# Patient Record
Sex: Male | Born: 1965 | Hispanic: No | Marital: Single | Smoking: Never smoker
Health system: Southern US, Community
[De-identification: ages and names within clinical notes are randomized; demographics above are authoritative.]

## PROBLEM LIST (undated history)

## (undated) DIAGNOSIS — E785 Hyperlipidemia, unspecified: Secondary | ICD-10-CM

## (undated) DIAGNOSIS — B1910 Unspecified viral hepatitis B without hepatic coma: Secondary | ICD-10-CM

## (undated) DIAGNOSIS — K219 Gastro-esophageal reflux disease without esophagitis: Secondary | ICD-10-CM

## (undated) DIAGNOSIS — R7303 Prediabetes: Secondary | ICD-10-CM

## (undated) DIAGNOSIS — T7840XA Allergy, unspecified, initial encounter: Secondary | ICD-10-CM

## (undated) HISTORY — DX: Allergy, unspecified, initial encounter: T78.40XA

## (undated) HISTORY — DX: Prediabetes: R73.03

## (undated) HISTORY — PX: NO PAST SURGERIES: SHX2092

## (undated) HISTORY — DX: Unspecified viral hepatitis B without hepatic coma: B19.10

## (undated) HISTORY — DX: Gastro-esophageal reflux disease without esophagitis: K21.9

## (undated) HISTORY — DX: Hyperlipidemia, unspecified: E78.5

---

## 2001-03-08 ENCOUNTER — Encounter: Payer: Self-pay | Admitting: Gastroenterology

## 2001-03-08 ENCOUNTER — Encounter: Admission: RE | Admit: 2001-03-08 | Discharge: 2001-03-08 | Payer: Self-pay | Admitting: Gastroenterology

## 2002-04-11 ENCOUNTER — Encounter: Admission: RE | Admit: 2002-04-11 | Discharge: 2002-04-11 | Payer: Self-pay | Admitting: Gastroenterology

## 2002-04-11 ENCOUNTER — Encounter: Payer: Self-pay | Admitting: Gastroenterology

## 2005-04-25 ENCOUNTER — Encounter: Admission: RE | Admit: 2005-04-25 | Discharge: 2005-04-25 | Payer: Self-pay | Admitting: Gastroenterology

## 2006-04-24 ENCOUNTER — Encounter: Admission: RE | Admit: 2006-04-24 | Discharge: 2006-04-24 | Payer: Self-pay | Admitting: Gastroenterology

## 2007-09-29 ENCOUNTER — Encounter: Admission: RE | Admit: 2007-09-29 | Discharge: 2007-09-29 | Payer: Self-pay | Admitting: Gastroenterology

## 2007-10-08 ENCOUNTER — Encounter: Admission: RE | Admit: 2007-10-08 | Discharge: 2007-10-08 | Payer: Self-pay | Admitting: Gastroenterology

## 2008-04-07 ENCOUNTER — Encounter: Admission: RE | Admit: 2008-04-07 | Discharge: 2008-04-07 | Payer: Self-pay | Admitting: Gastroenterology

## 2009-11-23 ENCOUNTER — Encounter: Admission: RE | Admit: 2009-11-23 | Discharge: 2009-11-23 | Payer: Self-pay | Admitting: Gastroenterology

## 2010-12-09 ENCOUNTER — Other Ambulatory Visit: Payer: Self-pay | Admitting: Gastroenterology

## 2010-12-09 DIAGNOSIS — C220 Liver cell carcinoma: Secondary | ICD-10-CM

## 2010-12-13 ENCOUNTER — Ambulatory Visit
Admission: RE | Admit: 2010-12-13 | Discharge: 2010-12-13 | Disposition: A | Discharge status: Home / Self Care | Payer: BC Managed Care – PPO | Source: Ambulatory Visit | Attending: Gastroenterology | Admitting: Gastroenterology

## 2010-12-13 DIAGNOSIS — C220 Liver cell carcinoma: Secondary | ICD-10-CM

## 2010-12-19 ENCOUNTER — Other Ambulatory Visit: Payer: Self-pay

## 2011-09-10 ENCOUNTER — Ambulatory Visit (INDEPENDENT_AMBULATORY_CARE_PROVIDER_SITE_OTHER): Payer: BC Managed Care – PPO

## 2011-09-10 DIAGNOSIS — B181 Chronic viral hepatitis B without delta-agent: Secondary | ICD-10-CM

## 2011-09-10 DIAGNOSIS — R42 Dizziness and giddiness: Secondary | ICD-10-CM

## 2011-09-10 DIAGNOSIS — H6120 Impacted cerumen, unspecified ear: Secondary | ICD-10-CM

## 2011-09-10 DIAGNOSIS — H9209 Otalgia, unspecified ear: Secondary | ICD-10-CM

## 2011-09-22 ENCOUNTER — Ambulatory Visit (INDEPENDENT_AMBULATORY_CARE_PROVIDER_SITE_OTHER): Payer: BC Managed Care – PPO | Admitting: Family Medicine

## 2011-09-22 VITALS — BP 122/74 | HR 62 | Temp 98.3°F | Resp 16 | Ht 67.0 in | Wt 161.4 lb

## 2011-09-22 DIAGNOSIS — B1910 Unspecified viral hepatitis B without hepatic coma: Secondary | ICD-10-CM

## 2011-09-22 DIAGNOSIS — M5430 Sciatica, unspecified side: Secondary | ICD-10-CM

## 2011-09-22 DIAGNOSIS — M545 Low back pain, unspecified: Secondary | ICD-10-CM

## 2011-09-22 DIAGNOSIS — B181 Chronic viral hepatitis B without delta-agent: Secondary | ICD-10-CM | POA: Insufficient documentation

## 2011-09-22 MED ORDER — CYCLOBENZAPRINE HCL 10 MG PO TABS: 10.0000 mg | ORAL_TABLET | Freq: Two times a day (BID) | ORAL | Status: AC | PRN

## 2011-09-22 MED ORDER — PREDNISONE 20 MG PO TABS
ORAL_TABLET | ORAL | Status: DC
Start: 2011-09-22 — End: 2012-03-08

## 2011-09-22 NOTE — Progress Notes (Signed)
Grant Wilson is a 46 year old gentleman who works at [employer]. Saturday he was reaching to his right and felt a sharp pain in his back. He's had this problem before in the remote past. This time the pain shot down his right leg since that time he said stiffness in his back and pain when he gets out of a chair. The sharp pain radiates down his right thigh from time to time when he is walking. He denies any trouble with urination, fever, bowel problems  Objective: This is an adult male in no acute distress he is moving very slowly.  Reflexes are normal straight leg raising is negative to and he is not tender in his low back. Abdomen is soft nontender without masses. Extremities unremarkable except for dry skin  Assessment: Acute low back syndrome with no reflex.  Plan: Keep out of work for 2 days, prednisone, Flexeril

## 2011-09-22 NOTE — Patient Instructions (Signed)
Back Pain, Adult Low back pain is very common. About 1 in 5 people have back pain.The cause of low back pain is rarely dangerous. The pain often gets better over time.About half of people with a sudden onset of back pain feel better in just 2 weeks. About 8 in 10 people feel better by 6 weeks.  CAUSES Some common causes of back pain include:  Strain of the muscles or ligaments supporting the spine.   Wear and tear (degeneration) of the spinal discs.   Arthritis.   Direct injury to the back.  DIAGNOSIS Most of the time, the direct cause of low back pain is not known.However, back pain can be treated effectively even when the exact cause of the pain is unknown.Answering your caregiver's questions about your overall health and symptoms is one of the most accurate ways to make sure the cause of your pain is not dangerous. If your caregiver needs more information, he or she may order lab work or imaging tests (X-rays or MRIs).However, even if imaging tests show changes in your back, this usually does not require surgery. HOME CARE INSTRUCTIONS For many people, back pain returns.Since low back pain is rarely dangerous, it is often a condition that people can learn to manageon their own.   Remain active. It is stressful on the back to sit or stand in one place. Do not sit, drive, or stand in one place for more than 30 minutes at a time. Take short walks on level surfaces as soon as pain allows.Try to increase the length of time you walk each day.   Do not stay in bed.Resting more than 1 or 2 days can delay your recovery.   Do not avoid exercise or work.Your body is made to move.It is not dangerous to be active, even though your back may hurt.Your back will likely heal faster if you return to being active before your pain is gone.   Pay attention to your body when you bend and lift. Many people have less discomfortwhen lifting if they bend their knees, keep the load close to their  bodies,and avoid twisting. Often, the most comfortable positions are those that put less stress on your recovering back.   Find a comfortable position to sleep. Use a firm mattress and lie on your side with your knees slightly bent. If you lie on your back, put a pillow under your knees.   Only take over-the-counter or prescription medicines as directed by your caregiver. Over-the-counter medicines to reduce pain and inflammation are often the most helpful.Your caregiver may prescribe muscle relaxant drugs.These medicines help dull your pain so you can more quickly return to your normal activities and healthy exercise.   Put ice on the injured area.   Put ice in a plastic bag.   Place a towel between your skin and the bag.   Leave the ice on for 15 to 20 minutes, 3 to 4 times a day for the first 2 to 3 days. After that, ice and heat may be alternated to reduce pain and spasms.   Ask your caregiver about trying back exercises and gentle massage. This may be of some benefit.   Avoid feeling anxious or stressed.Stress increases muscle tension and can worsen back pain.It is important to recognize when you are anxious or stressed and learn ways to manage it.Exercise is a great option.  SEEK MEDICAL CARE IF:  You have pain that is not relieved with rest or medicine.   You have   pain that does not improve in 1 week.   You have new symptoms.   You are generally not feeling well.  SEEK IMMEDIATE MEDICAL CARE IF:   You have pain that radiates from your back into your legs.   You develop new bowel or bladder control problems.   You have unusual weakness or numbness in your arms or legs.   You develop nausea or vomiting.   You develop abdominal pain.   You feel faint.  Document Released: 08/04/2005 Document Revised: 04/16/2011 Document Reviewed: 12/23/2010 [employer] Patient Information 2012 [employer]. 

## 2011-11-20 ENCOUNTER — Ambulatory Visit: Payer: BC Managed Care – PPO

## 2011-11-20 ENCOUNTER — Ambulatory Visit (INDEPENDENT_AMBULATORY_CARE_PROVIDER_SITE_OTHER): Payer: BC Managed Care – PPO | Admitting: Emergency Medicine

## 2011-11-20 VITALS — BP 124/73 | HR 69 | Temp 98.1°F | Resp 16 | Ht 67.0 in | Wt 166.0 lb

## 2011-11-20 DIAGNOSIS — M549 Dorsalgia, unspecified: Secondary | ICD-10-CM

## 2011-11-20 DIAGNOSIS — R1084 Generalized abdominal pain: Secondary | ICD-10-CM

## 2011-11-20 LAB — POCT URINALYSIS DIPSTICK
Bilirubin, UA: NEGATIVE
Leukocytes, UA: NEGATIVE
Nitrite, UA: NEGATIVE
pH, UA: 6.5

## 2011-11-20 LAB — POCT UA - MICROSCOPIC ONLY
WBC, Ur, HPF, POC: NEGATIVE
Yeast, UA: NEGATIVE

## 2011-11-20 MED ORDER — CYCLOBENZAPRINE HCL 10 MG PO TABS: ORAL_TABLET | ORAL | Status: DC

## 2011-11-20 MED ORDER — MELOXICAM 7.5 MG PO TABS: ORAL_TABLET | ORAL | Status: DC

## 2011-11-20 NOTE — Patient Instructions (Signed)
Advised to get MiraLax and take one dose a day for constipation. Next 1 cap of powder with 8 ounces of water.

## 2011-11-20 NOTE — Progress Notes (Signed)
  Subjective:    Patient ID: Grant Wilson, male    DOB: Jul 20, 1966, 46 y.o.   MRN: [id number]  HPI patient enters with pain in his low back which extends around his abdomen. He has had some pain into his legs. He does not do heavy lifting at work and does not remember a specific injury. He denies urinary symptoms of burning.    Review of Systems his basically been in good health. He felt tightness back today     Objective:   Physical Exam  Abdominal:       The abdomen is not distended there are no areas of tenderness. There no masses felt.  Musculoskeletal:       Examination the back reveals tenderness in the lower lumbar area on both sides. Deep tendon reflexes are 2+ and symmetrical. Straight leg raising is negative to 90. There is 5 out of 5 strength in both legs.    [facility] reading (PRIMARY) by  Dr. Cleta Alberts two-view abdomen reveals large amount of stool present. There are no bony abnormalities seen.        Assessment & Plan:   Will proceed with UA and go and get a view his LS-spine. Advise patient to use MiraLax for his constipation. Have started him on a muscle relaxant and anti-inflammatories for his back.

## 2011-11-28 ENCOUNTER — Ambulatory Visit (INDEPENDENT_AMBULATORY_CARE_PROVIDER_SITE_OTHER): Payer: BC Managed Care – PPO | Admitting: Family Medicine

## 2011-11-28 ENCOUNTER — Ambulatory Visit: Payer: BC Managed Care – PPO

## 2011-11-28 VITALS — BP 142/79 | HR 65 | Temp 98.2°F | Resp 16 | Ht 66.0 in | Wt 168.6 lb

## 2011-11-28 DIAGNOSIS — IMO0002 Reserved for concepts with insufficient information to code with codable children: Secondary | ICD-10-CM

## 2011-11-28 DIAGNOSIS — S39012A Strain of muscle, fascia and tendon of lower back, initial encounter: Secondary | ICD-10-CM

## 2011-11-28 MED ORDER — MELOXICAM 7.5 MG PO TABS: ORAL_TABLET | ORAL | Status: DC

## 2011-11-28 NOTE — Progress Notes (Signed)
Subjective: Patient is here for a recheck with regard to his back. He continues to have pain in the right side of the back, just above the pelvic brim, radiating around to the right carina area. He has some pain into his thigh. The pain is especially bad if he stands for a period of time. He does work at [employer], where he does have to do some pulling. He is on his feet for long stretches of time. He worked through Wednesday, but took yesterday and today off to rest his back. He  is not scheduled to go back to work until Monday. There was no specific injury. When he was in here last week he had x-rays of the lumbar spine which were normal, and a urinalysis which was normal. He has not had any urinary symptoms. He was treated with NSAID S., steroids, and muscle accident.  Objective: Mild tenderness of the right lumbar spine. Good flexion extension and lateral flexion. However there is pain on the right side when he flexes forward or tilts to the left pulling on the right. No tenderness of the abdomen or groin. Deep tendon reflexes are brisk. A. Leg raising test is negative.  Assessment: Lumbar strain, aggravated by weightbearing  Plan: Same medication. Advised to take the meloxicam twice daily. Gave a copy of the back owner's manual.  If patient feels like he is hurting too much to return to work on Monday, he is to call in here and we will give him an excuse for Monday and Tuesday. If he is not doing better by midweek he certainly needs to be rechecked

## 2011-11-28 NOTE — Patient Instructions (Signed)
Continue same medications. Increase the meloxicam to twice daily on a regular basis for now. If the pain is not doing better over the next couple of weeks he is to return. Gave him a copy of a back exercise booklet and back care booklet.

## 2012-02-20 ENCOUNTER — Ambulatory Visit (INDEPENDENT_AMBULATORY_CARE_PROVIDER_SITE_OTHER): Payer: BC Managed Care – PPO | Admitting: Emergency Medicine

## 2012-02-20 VITALS — BP 148/92 | HR 77 | Temp 98.0°F | Resp 16 | Ht 67.0 in | Wt 170.0 lb

## 2012-02-20 DIAGNOSIS — Z0000 Encounter for general adult medical examination without abnormal findings: Secondary | ICD-10-CM

## 2012-02-20 LAB — COMPREHENSIVE METABOLIC PANEL
ALT: 32 U/L (ref 0–53)
CO2: 25 mEq/L (ref 19–32)
Calcium: 9.2 mg/dL (ref 8.4–10.5)
Chloride: 104 mEq/L (ref 96–112)
Sodium: 139 mEq/L (ref 135–145)
Total Protein: 7.5 g/dL (ref 6.0–8.3)

## 2012-02-20 LAB — POCT CBC
Granulocyte percent: 51.3 %G (ref 37–80)
HCT, POC: 53.5 % (ref 43.5–53.7)
MCH, POC: 31.7 pg — AB (ref 27–31.2)
MCV: 98.2 fL — AB (ref 80–97)
POC LYMPH PERCENT: 39.2 %L (ref 10–50)
RBC: 5.45 M/uL (ref 4.69–6.13)
RDW, POC: 14 %
WBC: 6.2 10*3/uL (ref 4.6–10.2)

## 2012-02-20 LAB — POCT UA - MICROSCOPIC ONLY
Bacteria, U Microscopic: NEGATIVE
Mucus, UA: NEGATIVE
Yeast, UA: NEGATIVE

## 2012-02-20 LAB — LIPID PANEL: Cholesterol: 267 mg/dL — ABNORMAL HIGH (ref 0–200)

## 2012-02-20 LAB — POCT URINALYSIS DIPSTICK
Bilirubin, UA: NEGATIVE
Glucose, UA: NEGATIVE
Ketones, UA: NEGATIVE
Leukocytes, UA: NEGATIVE

## 2012-02-20 NOTE — Progress Notes (Signed)
  Subjective:    Patient ID: Grant Wilson, male    DOB: 20-Aug-1965, 46 y.o.   MRN: [id number]  HPI    Review of Systems     Objective:   Physical Exam        Assessment & Plan:   Subjective:     Grant Wilson is an 46 y.o. male who presents for  physical exam. He denies any current medical problems or concerns. Questionnaire and forms reviewed with patient and responses verified. Immunizations are up to date.  The following portions of the patient's history were reviewed and updated as appropriate: allergies, current medications, past family history, past medical history, past social history, past surgical history and problem list.  Review of Systems A comprehensive review of systems was negative.     Objective:    BP 148/92  Pulse 77  Temp 98 F (36.7 C) (Oral)  Resp 16  Ht 5\' 7"  (1.702 m)  Wt 170 lb (77.111 kg)  BMI 26.63 kg/m2  SpO2 98% BP 148/92  Pulse 77  Temp 98 F (36.7 C) (Oral)  Resp 16  Ht 5\' 7"  (1.702 m)  Wt 170 lb (77.111 kg)  BMI 26.63 kg/m2  SpO2 98%  General Appearance:    Alert, cooperative, no distress, appears stated age  Head:    Normocephalic, without obvious abnormality, atraumatic  Eyes:    PERRL, conjunctiva/corneas clear, EOM's intact, fundi    benign, both eyes       Ears:    Normal TM's and external ear canals, both ears  Nose:   Nares normal, septum midline, mucosa normal, no drainage    or sinus tenderness  Throat:   Lips, mucosa, and tongue normal; teeth and gums normal  Neck:   Supple, symmetrical, trachea midline, no adenopathy;       thyroid:  No enlargement/tenderness/nodules; no carotid   bruit or JVD  Back:     Symmetric, no curvature, ROM normal, no CVA tenderness  Lungs:     Clear to auscultation bilaterally, respirations unlabored  Chest wall:    No tenderness or deformity  Heart:    Regular rate and rhythm, S1 and S2 normal, no murmur, rub   or gallop  Abdomen:     Soft, non-tender, bowel sounds active all four  quadrants,    no masses, no organomegaly     Rectal:    Normal tone, normal prostate, no masses or tenderness;   guaiac negative stool  Extremities:   Extremities normal, atraumatic, no cyanosis or edema  Pulses:   2+ and symmetric all extremities  Skin:   Skin color, texture, turgor normal, no rashes or lesions  Lymph nodes:   Cervical, supraclavicular, and axillary nodes normal  Neurologic:   CNII-XII intact. Normal strength, sensation and reflexes      throughout      Assessment:    Satisfactory  physical exam.    Plan:    1. Completed, signed and returned forms. 2. Reviewed health maintenance, contraception, STDs, and substance abuse. 3. Follow up will be as needed.

## 2012-02-20 NOTE — Patient Instructions (Signed)

## 2012-02-21 LAB — VITAMIN D 25 HYDROXY (VIT D DEFICIENCY, FRACTURES): Vit D, 25-Hydroxy: 22 ng/mL — ABNORMAL LOW (ref 30–89)

## 2012-02-22 ENCOUNTER — Encounter: Payer: Self-pay | Admitting: Emergency Medicine

## 2012-02-22 ENCOUNTER — Other Ambulatory Visit: Payer: Self-pay | Admitting: Emergency Medicine

## 2012-02-22 MED ORDER — CHOLECALCIFEROL 1.25 MG (50000 UT) PO TABS: 1.0000 | ORAL_TABLET | ORAL | Status: DC

## 2012-02-22 MED ORDER — ATORVASTATIN CALCIUM 20 MG PO TABS: 20.0000 mg | ORAL_TABLET | Freq: Every day | ORAL | Status: DC

## 2012-03-08 ENCOUNTER — Ambulatory Visit (INDEPENDENT_AMBULATORY_CARE_PROVIDER_SITE_OTHER): Payer: BC Managed Care – PPO | Admitting: Family Medicine

## 2012-03-08 VITALS — BP 151/81 | HR 90 | Temp 98.6°F | Resp 16 | Ht 66.5 in | Wt 170.0 lb

## 2012-03-08 DIAGNOSIS — E559 Vitamin D deficiency, unspecified: Secondary | ICD-10-CM

## 2012-03-08 DIAGNOSIS — E785 Hyperlipidemia, unspecified: Secondary | ICD-10-CM

## 2012-03-08 DIAGNOSIS — K5900 Constipation, unspecified: Secondary | ICD-10-CM

## 2012-03-08 DIAGNOSIS — K219 Gastro-esophageal reflux disease without esophagitis: Secondary | ICD-10-CM

## 2012-03-08 NOTE — Patient Instructions (Addendum)
Results for orders placed in visit on 02/20/12  POCT CBC      Component Value Range   WBC 6.2  4.6 - 10.2 K/uL   Lymph, poc 2.4  0.6 - 3.4   POC LYMPH PERCENT 39.2  10 - 50 %L   MID (cbc) 0.6  0 - 0.9   POC MID % 9.5  0 - 12 %M   POC Granulocyte 3.2  2 - 6.9   Granulocyte percent 51.3  37 - 80 %G   RBC 5.45  4.69 - 6.13 M/uL   Hemoglobin 17.3  14.1 - 18.1 g/dL   HCT, POC 47.8  29.5 - 53.7 %   MCV 98.2 (*) 80 - 97 fL   MCH, POC 31.7 (*) 27 - 31.2 pg   MCHC 32.3  31.8 - 35.4 g/dL   RDW, POC 62.1     Platelet Count, POC 250  142 - 424 K/uL   MPV 9.2  0 - 99.8 fL  PSA      Component Value Range   PSA 0.76  <=4.00 ng/mL  VITAMIN D 25 HYDROXY      Component Value Range   Vit D, 25-Hydroxy 22 (*) 30 - 89 ng/mL  COMPREHENSIVE METABOLIC PANEL      Component Value Range   Sodium 139  135 - 145 mEq/L   Potassium 4.5  3.5 - 5.3 mEq/L   Chloride 104  96 - 112 mEq/L   CO2 25  19 - 32 mEq/L   Glucose, Bld 104 (*) 70 - 99 mg/dL   BUN 13  6 - 23 mg/dL   Creat 3.08  6.57 - 8.46 mg/dL   Total Bilirubin 0.6  0.3 - 1.2 mg/dL   Alkaline Phosphatase 43  39 - 117 U/L   AST 26  0 - 37 U/L   ALT 32  0 - 53 U/L   Total Protein 7.5  6.0 - 8.3 g/dL   Albumin 4.5  3.5 - 5.2 g/dL   Calcium 9.2  8.4 - 96.2 mg/dL  LIPID PANEL      Component Value Range   Cholesterol 267 (*) 0 - 200 mg/dL   Triglycerides 73  <952 mg/dL   HDL 38 (*) >84 mg/dL   Total CHOL/HDL Ratio 7.0     VLDL 15  0 - 40 mg/dL   LDL Cholesterol 132 (*) 0 - 99 mg/dL  TESTOSTERONE      Component Value Range   Testosterone 583.50  300 - 890 ng/dL  POCT URINALYSIS DIPSTICK      Component Value Range   Color, UA yellow     Clarity, UA clear     Glucose, UA neg     Bilirubin, UA neg     Ketones, UA neg     Spec Grav, UA 1.020     Blood, UA small     pH, UA 7.0     Protein, UA neg     Urobilinogen, UA 0.2     Nitrite, UA neg     Leukocytes, UA Negative    POCT UA - MICROSCOPIC ONLY      Component Value Range   WBC, Ur,  HPF, POC 0-1     RBC, urine, microscopic 1 large clump     Bacteria, U Microscopic neg     Mucus, UA neg     Epithelial cells, urine per micros 0-1     Crystals, Ur, HPF, POC neg  Casts, Ur, LPF, POC neg     Yeast, UA neg

## 2012-03-08 NOTE — Progress Notes (Signed)
46 yo [employer] with irregular exercise who was diagnosed with elevated cholesterol 2 weeks ago during physical exam.  Since that time, he has become constipated.  He was also started on vitamin D replacement.  We discussed the constipation and agreed that without other CAD risk factors he can stop the lipitor and recheck in 1 year, with instructions to increase vegetables and exercise for now. The Viread is probably partly responsible for the elevated cholesterol  P:  Stop atorvastatin, take Vitamin once weekly for 1 month Increase exercise, green vegetables Recheck labs 3-6 months  I spent 30 minutes reviewing GERD (OTC ranitidine prn), constipation (Colace prn), and cholesterol and vitamin D abnormalities:  Rely on exercise and diet

## 2012-03-16 ENCOUNTER — Other Ambulatory Visit: Payer: Self-pay | Admitting: Physician Assistant

## 2012-03-16 MED ORDER — FLUTICASONE PROPIONATE 50 MCG/ACT NA SUSP: 2.0000 | Freq: Every day | NASAL | Status: DC

## 2012-04-09 ENCOUNTER — Ambulatory Visit (INDEPENDENT_AMBULATORY_CARE_PROVIDER_SITE_OTHER): Payer: BC Managed Care – PPO | Admitting: Family Medicine

## 2012-04-09 VITALS — BP 118/76 | HR 64 | Temp 98.4°F | Resp 16 | Wt 167.6 lb

## 2012-04-09 DIAGNOSIS — K219 Gastro-esophageal reflux disease without esophagitis: Secondary | ICD-10-CM

## 2012-04-09 DIAGNOSIS — B1910 Unspecified viral hepatitis B without hepatic coma: Secondary | ICD-10-CM

## 2012-04-09 LAB — POCT CBC
Granulocyte percent: 49.3 %G (ref 37–80)
MCV: 97.9 fL — AB (ref 80–97)
MID (cbc): 0.5 (ref 0–0.9)
POC Granulocyte: 2.7 (ref 2–6.9)
POC LYMPH PERCENT: 42.1 %L (ref 10–50)
Platelet Count, POC: 254 10*3/uL (ref 142–424)
RDW, POC: 13.3 %

## 2012-04-09 MED ORDER — METOCLOPRAMIDE HCL 5 MG PO TABS: ORAL_TABLET | ORAL | Status: DC

## 2012-04-09 MED ORDER — OMEPRAZOLE 40 MG PO CPDR: 40.0000 mg | DELAYED_RELEASE_CAPSULE | Freq: Every day | ORAL | Status: DC

## 2012-04-09 NOTE — Progress Notes (Signed)
Subjective: 46 year old male from [place] who has a history of reflux in the past. He was treated with omeprazole in the past, and got some relief. Says he has run out of that medicine he has had a lot of trouble with bloating and belching and heartburn. His only other medicine that he is on he is Viread which he takes for hepatitis B. He has never had an EGD.  Objective Well-developed well-nourished man in no acute distress. Chest clear. No CVA tenderness. Abdomen had active bowel sounds. Soft without masses or significant tenderness.  Assessment: GERD History of hepatitis B  Plan: Continue him back on the omeprazole. Will also give him a little Reglan for when necessary use if he is having too much symptoms.

## 2012-04-09 NOTE — Addendum Note (Signed)
Addended by: Denton Lank on: 04/09/2012 06:28 PM   Modules accepted: Orders

## 2012-04-09 NOTE — Patient Instructions (Signed)
Use the medicines as directed. If you keep having a lot of symptoms we will send you to a specialist to get your stomach scoped.

## 2012-04-09 NOTE — Addendum Note (Signed)
Addended by: Andra Heslin E on: 04/09/2012 06:28 PM   Modules accepted: Orders  

## 2012-04-14 ENCOUNTER — Encounter: Payer: Self-pay | Admitting: *Deleted

## 2012-04-23 ENCOUNTER — Ambulatory Visit (INDEPENDENT_AMBULATORY_CARE_PROVIDER_SITE_OTHER): Payer: BC Managed Care – PPO | Admitting: Family Medicine

## 2012-04-23 ENCOUNTER — Encounter: Payer: Self-pay | Admitting: Family Medicine

## 2012-04-23 VITALS — BP 130/82 | HR 82 | Temp 98.0°F | Resp 16 | Ht 66.5 in | Wt 168.8 lb

## 2012-04-23 DIAGNOSIS — R042 Hemoptysis: Secondary | ICD-10-CM

## 2012-04-23 DIAGNOSIS — J309 Allergic rhinitis, unspecified: Secondary | ICD-10-CM

## 2012-04-23 DIAGNOSIS — J40 Bronchitis, not specified as acute or chronic: Secondary | ICD-10-CM

## 2012-04-23 MED ORDER — BENZONATATE 100 MG PO CAPS: 100.0000 mg | ORAL_CAPSULE | Freq: Two times a day (BID) | ORAL | Status: AC | PRN

## 2012-04-23 MED ORDER — AMOXICILLIN 875 MG PO TABS: 875.0000 mg | ORAL_TABLET | Freq: Two times a day (BID) | ORAL | Status: AC

## 2012-04-23 NOTE — Progress Notes (Signed)
  Subjective:    Patient ID: Grant Wilson, male    DOB: 11/02/1965, 46 y.o.   MRN: [id number]  HPI Patient with cough, had pain with cough yesterday today noticed he coughed up some blood. He has also had sore throat and nasal congestion. Denies any travel out of the country. Sore throat seems to be improving some since onset of illness several days ago. Has not been overseas.   Review of Systems    Objective:   Physical Exam Well developed well nourished male. Lungs sound clear. Heart rate and rhythm normal. TMs are normal. Throat has a little erythema. There is edema of the uvula.       Assessment & Plan:  Allergic rhinitis Bronchitis Hemoptysis  Plan: Amoxicillin, Tessalon, and Claritin. Return if worse.

## 2012-04-23 NOTE — Patient Instructions (Addendum)
If not better 4-5 days return to clinic for Chest xray and labs return sooner if trouble breathing or if symptoms worsen.   Take Claritin for allergy symptoms use one daily

## 2012-04-26 ENCOUNTER — Other Ambulatory Visit: Payer: Self-pay | Admitting: Gastroenterology

## 2012-04-26 DIAGNOSIS — B1910 Unspecified viral hepatitis B without hepatic coma: Secondary | ICD-10-CM

## 2012-04-26 DIAGNOSIS — K219 Gastro-esophageal reflux disease without esophagitis: Secondary | ICD-10-CM

## 2012-04-30 ENCOUNTER — Ambulatory Visit
Admission: RE | Admit: 2012-04-30 | Discharge: 2012-04-30 | Disposition: A | Discharge status: Home / Self Care | Payer: BC Managed Care – PPO | Source: Ambulatory Visit | Attending: Gastroenterology | Admitting: Gastroenterology

## 2012-04-30 DIAGNOSIS — B1910 Unspecified viral hepatitis B without hepatic coma: Secondary | ICD-10-CM

## 2012-04-30 DIAGNOSIS — K219 Gastro-esophageal reflux disease without esophagitis: Secondary | ICD-10-CM

## 2012-04-30 LAB — TSH: TSH: 1.64 u[IU]/mL (ref 0.41–5.90)

## 2012-04-30 LAB — HEMOGLOBIN A1C: Hgb A1c MFr Bld: 6.1 % — AB (ref 4.0–6.0)

## 2012-04-30 LAB — BASIC METABOLIC PANEL
BUN: 11 mg/dL (ref 4–21)
Creatinine: 1.1 mg/dL (ref 0.6–1.3)
Glucose: 96 mg/dL
Potassium: 4.5 mmol/L (ref 3.4–5.3)

## 2012-04-30 LAB — HEPATIC FUNCTION PANEL
AST: 26 U/L (ref 14–40)
Bilirubin, Total: 0.4 mg/dL

## 2012-04-30 LAB — LIPID PANEL: HDL: 38 mg/dL (ref 35–70)

## 2012-04-30 LAB — CBC AND DIFFERENTIAL: Hemoglobin: 16.5 g/dL (ref 13.5–17.5)

## 2012-05-06 LAB — BASIC METABOLIC PANEL: Sodium: 141 mmol/L (ref 137–147)

## 2012-05-12 ENCOUNTER — Encounter: Payer: Self-pay | Admitting: Physician Assistant

## 2012-05-12 ENCOUNTER — Ambulatory Visit (INDEPENDENT_AMBULATORY_CARE_PROVIDER_SITE_OTHER): Payer: BC Managed Care – PPO | Admitting: Physician Assistant

## 2012-05-12 VITALS — BP 136/74 | HR 80 | Temp 98.0°F | Resp 16 | Ht 66.5 in | Wt 167.2 lb

## 2012-05-12 DIAGNOSIS — K30 Functional dyspepsia: Secondary | ICD-10-CM

## 2012-05-12 DIAGNOSIS — K3189 Other diseases of stomach and duodenum: Secondary | ICD-10-CM

## 2012-05-12 DIAGNOSIS — E7800 Pure hypercholesterolemia, unspecified: Secondary | ICD-10-CM | POA: Insufficient documentation

## 2012-05-12 DIAGNOSIS — J309 Allergic rhinitis, unspecified: Secondary | ICD-10-CM

## 2012-05-12 DIAGNOSIS — J302 Other seasonal allergic rhinitis: Secondary | ICD-10-CM

## 2012-05-12 LAB — PSA: PSA: 0.8

## 2012-05-12 MED ORDER — LORATADINE 10 MG PO TABS: 10.0000 mg | ORAL_TABLET | Freq: Every day | ORAL | Status: DC

## 2012-05-12 MED ORDER — FLUTICASONE PROPIONATE 50 MCG/ACT NA SUSP: 2.0000 | Freq: Every day | NASAL | Status: DC

## 2012-05-12 MED ORDER — RANITIDINE HCL 300 MG PO TABS: 300.0000 mg | ORAL_TABLET | Freq: Every day | ORAL | Status: DC | PRN

## 2012-05-12 NOTE — Progress Notes (Signed)
  Subjective:    Patient ID: Grant Wilson, male    DOB: 1966-07-19, 46 y.o.   MRN: [id number]  HPI Pt presents to clinic for several concerns 1- was seen in the past for sore throat and he started to take Claritin and the symptoms resolved but since he has stopped the meds his sore throat has returned. 2- is on prevacid and that has helped tremendously but once or twice a month he has break through heartburn where he feels like he has burning in his throat. 3- got his labs done at work and wants to review them.  He has h/o elevated cholesterol but did not like the lipitor so did not take it - he current LDl is 177 but it was 214, so there has been an improvement with diet changes.    Review of Systems  Constitutional: Negative for fever and chills.  HENT: Positive for congestion, sore throat and postnasal drip.   Gastrointestinal: Negative for nausea, vomiting, abdominal pain and diarrhea.       Objective:   Physical Exam  Vitals reviewed. Constitutional: He is oriented to person, place, and time. He appears well-developed and well-nourished.  HENT:  Head: Normocephalic and atraumatic.  Right Ear: Hearing, tympanic membrane, external ear and ear canal normal.  Left Ear: Hearing, tympanic membrane, external ear and ear canal normal.  Nose: Mucosal edema present.  Eyes: Conjunctivae normal are normal.  Neck: Neck supple.  Cardiovascular: Normal rate, regular rhythm and normal heart sounds.   No murmur heard. Pulmonary/Chest: Effort normal and breath sounds normal.  Lymphadenopathy:    He has no cervical adenopathy.  Neurological: He is alert and oriented to person, place, and time.  Skin: Skin is warm and dry.  Psychiatric: He has a normal mood and affect. His behavior is normal. Judgment and thought content normal.          Assessment & Plan:   1. Indigestion  ranitidine (ZANTAC) 300 MG tablet  2. Seasonal allergies  loratadine (CLARITIN) 10 MG tablet, fluticasone  (FLONASE) 50 MCG/ACT nasal spray  3. Elevated cholesterol    1- pt will continue prevacid and add Zantac prn for breakthrough heartburn. 2- 3- Pt will continue with healthy diet choices for the next 3 months because he has had such an improvement with his LDL.  If the improvement continues will stay with diet changes but if no continued improvement - will need medications. Pt understands and agrees with this.

## 2012-05-12 NOTE — Patient Instructions (Addendum)
Cholesterol Control Diet  Cholesterol levels in your body are determined significantly by your diet. Cholesterol levels may also be related to heart disease. The following material helps to explain this relationship and discusses what you can do to help keep your heart healthy. Not all cholesterol is bad. Low-density lipoprotein (LDL) cholesterol is the "bad" cholesterol. It may cause fatty deposits to build up inside your arteries. High-density lipoprotein (HDL) cholesterol is "good." It helps to remove the "bad" LDL cholesterol from your blood. Cholesterol is a very important risk factor for heart disease. Other risk factors are high blood pressure, smoking, stress, heredity, and weight.  The heart muscle gets its supply of blood through the coronary arteries. If your LDL cholesterol is high and your HDL cholesterol is low, you are at risk for having fatty deposits build up in your coronary arteries. This leaves less room through which blood can flow. Without sufficient blood and oxygen, the heart muscle cannot function properly and you may feel chest pains (angina pectoris). When a coronary artery closes up entirely, a part of the heart muscle may die, causing a heart attack (myocardial infarction).  CHECKING CHOLESTEROL  When your caregiver sends your blood to a lab to be analyzed for cholesterol, a complete lipid (fat) profile may be done. With this test, the total amount of cholesterol and levels of LDL and HDL are determined. Triglycerides are a type of fat that circulates in the blood and can also be used to determine heart disease risk. The list below describes what the numbers should be:  Test: Total Cholesterol.   Less than 200 mg/dl.   Test: LDL "bad cholesterol."   Less than 100 mg/dl.    Less than 70 mg/dl if you are at very high risk of a heart attack or sudden cardiac death.   Test: HDL "good cholesterol."   Greater than 50 mg/dl for women.    Greater than 40 mg/dl for men.    Test: Triglycerides.   Less than 150 mg/dl.   CONTROLLING CHOLESTEROL WITH DIET  Although exercise and lifestyle factors are important, your diet is key. That is because certain foods are known to raise cholesterol and others to lower it. The goal is to balance foods for their effect on cholesterol and more importantly, to replace saturated and trans fat with other types of fat, such as monounsaturated fat, polyunsaturated fat, and omega-3 fatty acids.  On average, a person should consume no more than 15 to 17 g of saturated fat daily. Saturated and trans fats are considered "bad" fats, and they will raise LDL cholesterol. Saturated fats are primarily found in animal products such as meats, butter, and cream. However, that does not mean you need to sacrifice all your favorite foods. Today, there are good tasting, low-fat, low-cholesterol substitutes for most of the things you like to eat. Choose low-fat or nonfat alternatives. Choose round or loin cuts of red meat, since these types of cuts are lowest in fat and cholesterol. Chicken (without the skin), fish, veal, and ground turkey breast are excellent choices. Eliminate fatty meats, such as hot dogs and salami. Even shellfish have little or no saturated fat. Have a 3 oz (85 g) portion when you eat lean meat, poultry, or fish.  Trans fats are also called "partially hydrogenated oils." They are oils that have been scientifically manipulated so that they are solid at room temperature resulting in a longer shelf life and improved taste and texture of foods in which they are   added. Trans fats are found in stick margarine, some tub margarines, cookies, crackers, and baked goods.    When baking and cooking, oils are an excellent substitute for butter. The monounsaturated oils are especially beneficial since it is believed they lower LDL and raise HDL. The oils you should avoid entirely are saturated tropical oils, such as coconut and palm.     Remember to eat liberally from food groups that are naturally free of saturated and trans fat, including fish, fruit, vegetables, beans, grains (barley, rice, couscous, bulgur wheat), and pasta (without cream sauces).    IDENTIFYING FOODS THAT LOWER CHOLESTEROL    Soluble fiber may lower your cholesterol. This type of fiber is found in fruits such as apples, vegetables such as broccoli, potatoes, and carrots, legumes such as beans, peas, and lentils, and grains such as barley. Foods fortified with plant sterols (phytosterol) may also lower cholesterol. You should eat at least 2 g per day of these foods for a cholesterol lowering effect.    Read package labels to identify low-saturated fats, trans fats free, and low-fat foods at the supermarket. Select cheeses that have only 2 to 3 g saturated fat per ounce. Use a heart-healthy tub margarine that is free of trans fats or partially hydrogenated oil. When buying baked goods (cookies, crackers), avoid partially hydrogenated oils. Breads and muffins should be made from whole grains (whole-wheat or whole oat flour, instead of "flour" or "enriched flour"). Buy non-creamy canned soups with reduced salt and no added fats.    FOOD PREPARATION TECHNIQUES    Never deep-fry. If you must fry, either stir-fry, which uses very little fat, or use non-stick cooking sprays. When possible, broil, bake, or roast meats, and steam vegetables. Instead of dressing vegetables with butter or margarine, use lemon and herbs, applesauce and cinnamon (for squash and sweet potatoes), nonfat yogurt, salsa, and low-fat dressings for salads.    LOW-SATURATED FAT / LOW-FAT FOOD SUBSTITUTES  Meats / Saturated Fat (g)   Avoid: Steak, marbled (3 oz/85 g) / 11 g    Choose: Steak, lean (3 oz/85 g) / 4 g    Avoid: Hamburger (3 oz/85 g) / 7 g    Choose: Hamburger, lean (3 oz/85 g) / 5 g    Avoid: Ham (3 oz/85 g) / 6 g    Choose: Ham, lean cut (3 oz/85 g) / 2.4 g     Avoid: Chicken, with skin, dark meat (3 oz/85 g) / 4 g    Choose: Chicken, skin removed, dark meat (3 oz/85 g) / 2 g    Avoid: Chicken, with skin, light meat (3 oz/85 g) / 2.5 g    Choose: Chicken, skin removed, light meat (3 oz/85 g) / 1 g   Dairy / Saturated Fat (g)   Avoid: Whole milk (1 cup) / 5 g    Choose: Low-fat milk, 2% (1 cup) / 3 g    Choose: Low-fat milk, 1% (1 cup) / 1.5 g    Choose: Skim milk (1 cup) / 0.3 g    Avoid: Hard cheese (1 oz/28 g) / 6 g    Choose: Skim milk cheese (1 oz/28 g) / 2 to 3 g    Avoid: Cottage cheese, 4% fat (1 cup) / 6.5 g    Choose: Low-fat cottage cheese, 1% fat (1 cup) / 1.5 g    Avoid: Ice cream (1 cup) / 9 g    Choose: Sherbet (1 cup) / 2.5 g      Choose: Nonfat frozen yogurt (1 cup) / 0.3 g    Choose: Frozen fruit bar / trace    Avoid: Whipped cream (1 tbs) / 3.5 g    Choose: Nondairy whipped topping (1 tbs) / 1 g   Condiments / Saturated Fat (g)   Avoid: Mayonnaise (1 tbs) / 2 g    Choose: Low-fat mayonnaise (1 tbs) / 1 g    Avoid: Butter (1 tbs) / 7 g    Choose: Extra light margarine (1 tbs) / 1 g    Avoid: Coconut oil (1 tbs) / 11.8 g    Choose: Olive oil (1 tbs) / 1.8 g    Choose: Corn oil (1 tbs) / 1.7 g    Choose: Safflower oil (1 tbs) / 1.2 g    Choose: Sunflower oil (1 tbs) / 1.4 g    Choose: Soybean oil (1 tbs) / 2.4 g    Choose: Canola oil (1 tbs) / 1 g   Document Released: 08/04/2005 Document Revised: 07/24/2011 Document Reviewed: 01/23/2011  [employer] Patient Information 2012 [employer].

## 2012-06-24 ENCOUNTER — Ambulatory Visit (INDEPENDENT_AMBULATORY_CARE_PROVIDER_SITE_OTHER): Payer: BC Managed Care – PPO | Admitting: Family Medicine

## 2012-06-24 VITALS — BP 120/76 | HR 69 | Temp 98.4°F | Resp 17 | Ht 66.0 in | Wt 160.0 lb

## 2012-06-24 DIAGNOSIS — Z23 Encounter for immunization: Secondary | ICD-10-CM

## 2012-06-24 DIAGNOSIS — S335XXA Sprain of ligaments of lumbar spine, initial encounter: Secondary | ICD-10-CM

## 2012-06-24 DIAGNOSIS — S39012A Strain of muscle, fascia and tendon of lower back, initial encounter: Secondary | ICD-10-CM

## 2012-06-24 MED ORDER — METHOCARBAMOL 750 MG PO TABS: ORAL_TABLET | ORAL | Status: DC

## 2012-06-24 MED ORDER — OXAPROZIN 600 MG PO TABS: ORAL_TABLET | ORAL | Status: DC

## 2012-06-24 NOTE — Progress Notes (Signed)
Subjective: 46 year old man from, who has been hurting for about the last 5 days or so. He has pain in the very low back across above the line of the pelvis to the right side. The pain finally was down to the hip and buttock region, but it is not there now. It is higher than it was before. He does not play sports. He knows of no specific injury. 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 When he walks around for a few minutes it loosens up and ceases hurting him so. He has not been taking any medications for it.  Objective: Abdomen soft without masses or tenderness. No major tenderness, as the pain seems to be deeper in the low back. Spine is intact. Flexion causes a little discomfort. Lateral tilt seems to be normal as is extension. Straight leg raise test negative.  Assessment: Right low back strain, etiology unclear.  Plan:  Robaxin for muscle relaxant Daypro  return if not improving

## 2012-06-24 NOTE — Patient Instructions (Signed)
Try to stretch when painful  Take medicine as ordered  Return if not better in a couple of weeks

## 2012-07-22 ENCOUNTER — Ambulatory Visit (INDEPENDENT_AMBULATORY_CARE_PROVIDER_SITE_OTHER): Payer: BC Managed Care – PPO | Admitting: Internal Medicine

## 2012-07-22 VITALS — BP 144/77 | HR 73 | Temp 98.2°F | Resp 16 | Ht 67.0 in | Wt 163.0 lb

## 2012-07-22 DIAGNOSIS — M479 Spondylosis, unspecified: Secondary | ICD-10-CM

## 2012-07-22 DIAGNOSIS — M545 Low back pain, unspecified: Secondary | ICD-10-CM | POA: Insufficient documentation

## 2012-07-22 DIAGNOSIS — G8929 Other chronic pain: Secondary | ICD-10-CM | POA: Insufficient documentation

## 2012-07-22 DIAGNOSIS — M47815 Spondylosis without myelopathy or radiculopathy, thoracolumbar region: Secondary | ICD-10-CM

## 2012-07-22 MED ORDER — METHYLPREDNISOLONE ACETATE 80 MG/ML IJ SUSP
120.0000 mg | Freq: Once | INTRAMUSCULAR | Status: AC
  Administered 2012-07-22: 120 mg via INTRAMUSCULAR

## 2012-07-22 NOTE — Progress Notes (Signed)
  Subjective:    Patient ID: Grant Wilson, male    DOB: 1966/06/21, 46 y.o.   MRN: [id number]  HPI Has chronic ls back pain, works lifting daily, has had off and on pain for years. 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 No radiculopathy , weakness, numbness or incontinence. No wt loss, anorexia, nite sweats, or fatigue. Nsaids hurt his stomach and his liver.   Review of Systems     Objective:   Physical Exam  Vitals reviewed. Constitutional: He is oriented to person, place, and time. He appears well-developed and well-nourished.  Eyes: EOM are normal. No scleral icterus.  Pulmonary/Chest: Effort normal.  Musculoskeletal:       Lumbar back: He exhibits decreased range of motion, tenderness, bony tenderness and spasm.       Back:  Neurological: He is alert and oriented to person, place, and time. He has normal reflexes. He exhibits normal muscle tone. Coordination normal.  Skin: Skin is warm and dry. No rash noted.  Psychiatric: He has a normal mood and affect.          Assessment & Plan:  DC nsaids Depomedrol 120mg  im Robaxin prn Refer to PT/back manual given

## 2012-08-19 ENCOUNTER — Ambulatory Visit: Payer: BC Managed Care – PPO

## 2012-10-04 ENCOUNTER — Ambulatory Visit: Payer: PRIVATE HEALTH INSURANCE

## 2012-10-04 ENCOUNTER — Ambulatory Visit (INDEPENDENT_AMBULATORY_CARE_PROVIDER_SITE_OTHER): Payer: PRIVATE HEALTH INSURANCE | Admitting: Internal Medicine

## 2012-10-04 VITALS — BP 129/84 | HR 69 | Temp 98.5°F | Resp 17 | Ht 66.5 in | Wt 159.0 lb

## 2012-10-04 DIAGNOSIS — R05 Cough: Secondary | ICD-10-CM

## 2012-10-04 DIAGNOSIS — R51 Headache: Secondary | ICD-10-CM

## 2012-10-04 DIAGNOSIS — R059 Cough, unspecified: Secondary | ICD-10-CM

## 2012-10-04 DIAGNOSIS — J209 Acute bronchitis, unspecified: Secondary | ICD-10-CM

## 2012-10-04 LAB — POCT CBC
Granulocyte percent: 50.6 %G (ref 37–80)
HCT, POC: 48.1 % (ref 43.5–53.7)
MID (cbc): 0.6 (ref 0–0.9)
MPV: 8.7 fL (ref 0–99.8)
POC Granulocyte: 3.1 (ref 2–6.9)
POC LYMPH PERCENT: 39 %L (ref 10–50)
POC MID %: 10.4 %M (ref 0–12)
Platelet Count, POC: 186 10*3/uL (ref 142–424)
RDW, POC: 13.9 %

## 2012-10-04 MED ORDER — AZITHROMYCIN 500 MG PO TABS: 500.0000 mg | ORAL_TABLET | Freq: Every day | ORAL | Status: DC

## 2012-10-04 MED ORDER — DM-GUAIFENESIN ER 30-600 MG PO TB12: 1.0000 | ORAL_TABLET | Freq: Two times a day (BID) | ORAL | Status: DC

## 2012-10-04 NOTE — Progress Notes (Signed)
  Subjective:    Patient ID: Grant Wilson, male    DOB: June 13, 1966, 47 y.o.   MRN: [id number]  HPI Cc: cough headache nasal congestion Onset: Cough several weeks ago. Had mucin ex for the cough for 2 days with good relief but now has the symptoms again. No chest pain no shortness of breath. Cough with grayish sputum sometimes no sputum. Is not and has never been a smoker. No environmental exposures at work other than dust works at [employer] .No weight loss fever or chills. Has chronic hep b getting treatment neg for hep c and hiv as per pt. Ha for 2 days and nasal congestion for 2 days.  Review of Systems  Constitutional: Positive for fatigue.  HENT: Positive for congestion and postnasal drip.   Eyes: Negative.   Respiratory: Positive for cough.   Cardiovascular: Negative.   Gastrointestinal: Negative.   Endocrine: Negative.   Genitourinary: Negative.   Musculoskeletal: Negative.   Skin: Negative.   Allergic/Immunologic: Negative.   Neurological: Negative.   Hematological: Negative.   Psychiatric/Behavioral: Negative.   All other systems reviewed and are negative.       Objective:   Physical Exam  Constitutional: He is oriented to person, place, and time. He appears well-developed and well-nourished.  HENT:  Head: Normocephalic and atraumatic.  Right Ear: External ear normal.  Left Ear: External ear normal.  Nose: Nose normal.  Mouth/Throat: Oropharynx is clear and moist.  Eyes: Conjunctivae and EOM are normal. Pupils are equal, round, and reactive to light.  Neck: Normal range of motion. Neck supple.  Cardiovascular: Normal rate, regular rhythm, normal heart sounds and intact distal pulses.   Pulmonary/Chest: Effort normal.  scattered rhonchi  Abdominal: Soft. Bowel sounds are normal.  Musculoskeletal: Normal range of motion.  Neurological: He is alert and oriented to person, place, and time. He has normal reflexes.  Skin: Skin is warm and dry.  Psychiatric: He has  a normal mood and affect. His behavior is normal. Judgment and thought content normal.     Results for orders placed in visit on 10/04/12  POCT CBC      Result Value Range   WBC 6.1  4.6 - 10.2 K/uL   Lymph, poc 2.4  0.6 - 3.4   POC LYMPH PERCENT 39.0  10 - 50 %L   MID (cbc) 0.6  0 - 0.9   POC MID % 10.4  0 - 12 %M   POC Granulocyte 3.1  2 - 6.9   Granulocyte percent 50.6  37 - 80 %G   RBC 4.86  4.69 - 6.13 M/uL   Hemoglobin 15.4  14.1 - 18.1 g/dL   HCT, POC 78.2  95.6 - 53.7 %   MCV 98.9 (*) 80 - 97 fL   MCH, POC 31.7 (*) 27 - 31.2 pg   MCHC 32.0  31.8 - 35.4 g/dL   RDW, POC 21.3     Platelet Count, POC 186  142 - 424 K/uL   MPV 8.7  0 - 99.8 fL   [facility] reading (PRIMARY) by  Dr. Mindi Junker.  increased bronchial markings consistent with bronchitus no pneumonia    Assessment & Plan:  Cough for 2 weeks in an immunocompromised host with chronic he[atitis B on chronic antiviral meds. Non smoker .Afebrile . Will obtain chest xray and cbc and treat accordingly.Zithromax 500 mg daily fo r5 days. mucinex as directed. Ibuprofen for headache.

## 2012-10-04 NOTE — Patient Instructions (Signed)
Take meds as directed. Return if no improvementl

## 2012-11-07 ENCOUNTER — Ambulatory Visit (INDEPENDENT_AMBULATORY_CARE_PROVIDER_SITE_OTHER): Payer: PRIVATE HEALTH INSURANCE | Admitting: Family Medicine

## 2012-11-07 VITALS — BP 122/70 | HR 89 | Temp 98.3°F | Resp 16 | Ht 67.0 in | Wt 162.0 lb

## 2012-11-07 DIAGNOSIS — K602 Anal fissure, unspecified: Secondary | ICD-10-CM

## 2012-11-07 MED ORDER — DILTIAZEM GEL 2 %: CUTANEOUS | Status: DC

## 2012-11-07 MED ORDER — LIDOCAINE 5 % EX OINT: TOPICAL_OINTMENT | CUTANEOUS | Status: DC

## 2012-11-07 NOTE — Patient Instructions (Signed)
Apply the ointments as discussed for an anal fissure.  Recheck if not improving this week.  Return to the clinic or go to the nearest emergency room if any of your symptoms worsen or new symptoms occur. Anal Fissure, Adult An anal fissure is a small tear or crack in the skin around the anus. Bleeding from a fissure usually stops on its own within a few minutes. However, bleeding will often reoccur with each bowel movement until the crack heals.  CAUSES   Passing large, hard stools.  Frequent diarrheal stools.  Constipation.  Inflammatory bowel disease (Crohn's disease or ulcerative colitis).  Infections.  Anal sex. SYMPTOMS   Small amounts of blood seen on your stools, on toilet paper, or in the toilet after a bowel movement.  Rectal bleeding.  Painful bowel movements.  Itching or irritation around the anus. DIAGNOSIS Your caregiver will examine the anal area. An anal fissure can usually be seen with careful inspection. A rectal exam may be performed and a short tube (anoscope) may be used to examine the anal canal. TREATMENT   You may be instructed to take fiber supplements. These supplements can soften your stool to help make bowel movements easier.  Sitz baths may be recommended to help heal the tear. Do not use soap in the sitz baths.  A medicated cream or ointment may be prescribed to lessen discomfort. HOME CARE INSTRUCTIONS   Maintain a diet high in fruits, whole grains, and vegetables. Avoid constipating foods like bananas and dairy products.  Take sitz baths as directed by your caregiver.  Drink enough fluids to keep your urine clear or pale yellow.  Only take over-the-counter or prescription medicines for pain, discomfort, or fever as directed by your caregiver. Do not take aspirin as this may increase bleeding.  Do not use ointments containing numbing medications (anesthetics) or hydrocortisone. They could slow healing. SEEK MEDICAL CARE IF:   Your fissure  is not completely healed within 3 days.  You have further bleeding.  You have a fever.  You have diarrhea mixed with blood.  You have pain.  Your problem is getting worse rather than better. MAKE SURE YOU:   Understand these instructions.  Will watch your condition.  Will get help right away if you are not doing well or get worse. Document Released: 08/04/2005 Document Revised: 10/27/2011 Document Reviewed: 01/19/2011 [facility] Patient Information 2013 [place], [place].

## 2012-11-07 NOTE — Progress Notes (Signed)
  Subjective:    Patient ID: Grant Wilson, male    DOB: [birth year], 47 y.o.   MRN: [id number]  HPI Grant Wilson is a 47 y.o. male    Review of Systems     Objective:   Physical Exam        Assessment & Plan:

## 2012-11-07 NOTE — Progress Notes (Signed)
Subjective:    Patient ID: Grant Wilson, male    DOB: [birth year], 47 y.o.   MRN: [id number]  HPI Grant Wilson is a 47 y.o. male Had diarrhea 5 days ago - for about 2-3 days.  Soreness around anus with BM or wiping. Light blood around area with wiping. BM's normal now, just sore in anal area.  Does strain at times - sore to have bm.   Hx of hemorrhoids - one there now.   Tx:  None.    Review of Systems  Constitutional: Negative for fever and chills.  Gastrointestinal: Positive for anal bleeding (small amt with wiping. ). Negative for abdominal pain and blood in stool.  Skin: Negative for rash.       Objective:   Physical Exam  Constitutional: He is oriented to person, place, and time. He appears well-developed and well-nourished.  Pulmonary/Chest: Effort normal.  Genitourinary: Rectal exam shows external hemorrhoid, fissure and tenderness.     Neurological: He is alert and oriented to person, place, and time.  Skin: Skin is warm and dry.  Psychiatric: He has a normal mood and affect. His behavior is normal.          Assessment & Plan:  Grant Wilson is a 47 y.o. male Anal fissure - Plan: diltiazem 2 % GEL, lidocaine (XYLOCAINE) 5 % ointment sx care,  Ext hemorrhoid noted, but nonthrombosed. rtc precautions discussed.   Patient Instructions  Apply the ointments as discussed for an anal fissure.  Recheck if not improving this week.  Return to the clinic or go to the nearest emergency room if any of your symptoms worsen or new symptoms occur. Anal Fissure, Adult An anal fissure is a small tear or crack in the skin around the anus. Bleeding from a fissure usually stops on its own within a few minutes. However, bleeding will often reoccur with each bowel movement until the crack heals.  CAUSES   Passing large, hard stools.  Frequent diarrheal stools.  Constipation.  Inflammatory bowel disease (Crohn's disease or ulcerative colitis).  Infections.  Anal  sex. SYMPTOMS   Small amounts of blood seen on your stools, on toilet paper, or in the toilet after a bowel movement.  Rectal bleeding.  Painful bowel movements.  Itching or irritation around the anus. DIAGNOSIS Your caregiver will examine the anal area. An anal fissure can usually be seen with careful inspection. A rectal exam may be performed and a short tube (anoscope) may be used to examine the anal canal. TREATMENT   You may be instructed to take fiber supplements. These supplements can soften your stool to help make bowel movements easier.  Sitz baths may be recommended to help heal the tear. Do not use soap in the sitz baths.  A medicated cream or ointment may be prescribed to lessen discomfort. HOME CARE INSTRUCTIONS   Maintain a diet high in fruits, whole grains, and vegetables. Avoid constipating foods like bananas and dairy products.  Take sitz baths as directed by your caregiver.  Drink enough fluids to keep your urine clear or pale yellow.  Only take over-the-counter or prescription medicines for pain, discomfort, or fever as directed by your caregiver. Do not take aspirin as this may increase bleeding.  Do not use ointments containing numbing medications (anesthetics) or hydrocortisone. They could slow healing. SEEK MEDICAL CARE IF:   Your fissure is not completely healed within 3 days.  You have further bleeding.  You have a fever.  You have  diarrhea mixed with blood.  You have pain.  Your problem is getting worse rather than better. MAKE SURE YOU:   Understand these instructions.  Will watch your condition.  Will get help right away if you are not doing well or get worse. Document Released: 08/04/2005 Document Revised: 10/27/2011 Document Reviewed: 01/19/2011 [facility] Patient Information 2013 [place], [place].    Meds ordered this encounter  Medications  . diltiazem 2 % GEL    Sig: Apply pea sized amount to affected area three times per day as  needed.    Dispense:  30 g    Refill:  0  . lidocaine (XYLOCAINE) 5 % ointment    Sig: Apply pea sized amount three times per day to affected area as needed.    Dispense:  35.44 g    Refill:  0    Lidocaine 5% anorectal GEL.

## 2012-12-19 ENCOUNTER — Ambulatory Visit (INDEPENDENT_AMBULATORY_CARE_PROVIDER_SITE_OTHER): Payer: PRIVATE HEALTH INSURANCE | Admitting: Internal Medicine

## 2012-12-19 VITALS — BP 136/81 | HR 75 | Temp 97.9°F | Resp 16 | Ht 67.5 in | Wt 161.0 lb

## 2012-12-19 DIAGNOSIS — M545 Low back pain: Secondary | ICD-10-CM

## 2012-12-19 DIAGNOSIS — J329 Chronic sinusitis, unspecified: Secondary | ICD-10-CM

## 2012-12-19 DIAGNOSIS — N529 Male erectile dysfunction, unspecified: Secondary | ICD-10-CM

## 2012-12-19 MED ORDER — AMOXICILLIN 500 MG PO CAPS: 1000.0000 mg | ORAL_CAPSULE | Freq: Two times a day (BID) | ORAL | Status: DC

## 2012-12-19 MED ORDER — SILDENAFIL CITRATE 100 MG PO TABS: 100.0000 mg | ORAL_TABLET | Freq: Every day | ORAL | Status: DC | PRN

## 2012-12-19 MED ORDER — METHYLPREDNISOLONE ACETATE 80 MG/ML IJ SUSP
120.0000 mg | Freq: Once | INTRAMUSCULAR | Status: AC
  Administered 2012-12-19: 120 mg via INTRAMUSCULAR

## 2012-12-19 MED ORDER — METHOCARBAMOL 750 MG PO TABS: 750.0000 mg | ORAL_TABLET | Freq: Four times a day (QID) | ORAL | Status: DC

## 2012-12-19 NOTE — Progress Notes (Signed)
  Subjective:    Patient ID: Grant Wilson, male    DOB: 31-Mar-1966, 47 y.o.   MRN: [id number]  HPI Patient has a bitter taste in his mouth.  Patient denies dry mouth.  Patient states he drinks water and cranberry juice.  Patient feels like he has some allergies.  Patients mucous drainage is a green tint.    Patient states he didn't go to orthopedic dr because he began to feel better.   Patient states that he is now feeling some pain again on his back and sides.  Patient denies heavy lifting at work.    Review of Systems     Objective:   Physical Exam        Assessment & Plan:  Brush your teeth and your tongue to help bitter taste.  Take a mens one a day vitamin.

## 2012-12-19 NOTE — Patient Instructions (Signed)
Erectile Dysfunction  Erectile dysfunction (ED) is the inability to get a good enough erection to have sexual intercourse. ED may involve:  · Inability to get an erection.  · Lack of enough hardness to allow penetration.  · Loss of the erection before sex is finished.  · Premature ejaculation.  · Any combination of these problems if they occur more than 25% of the time.  CAUSES  · Certain drugs, such as:  · Pain relievers.  · Antihistamines.  · Antidepressants.  · Blood pressure medicines.  · Water pills.  · Ulcer medicines.  · Muscle relaxants.  · Illegal drugs.  · Excessive drinking.  · Psychological causes, such as:  · Anxiety.  · Depression.  · Sadness.  · Exhaustion.  · Performance fear.  · Stress.  · Physical causes, such as:  · Artery problems. This may include diabetes, smoking, liver disease, or atherosclerosis.  · High blood pressure.  · Hormonal problems, such as low testosterone.  · Obesity.  · Nerve problems. This may include back or pelvic injuries, diabetes, multiple sclerosis, Parkinson's disease, or some surgeries.  SYMPTOMS  · Inability to get an erection.  · Lack of enough hardness to allow penetration.  · Loss of the erection before sex is finished.  · Premature ejaculation.  · Normal erections at some times, but with frequent unsatisfactory episodes.  · Orgasms that are not satisfactory in sensation or frequency.  · Low sexual satisfaction in either partner because of erection problems.  · A curved penis occurring with erection. The curve may cause pain or may be too curved to allow for intercourse.  · Never having nighttime erections.  DIAGNOSIS  Your caregiver can often diagnose this condition by:  · Performing a physical exam to find other diseases or specific problems with the penis.  · Asking you detailed questions about the problem.  · Performing blood tests to check for diabetes or to measure hormone levels.  · Performing urine tests to find other underlying health  conditions.  · Performing an ultrasound to check for scarring.  · Performing a test to check blood flow to the penis.  · Doing a sleep study at home to measure nighttime erections.  TREATMENT   · You may be prescribed medicines by mouth.  · You may be given medicine injections into the penis.  · You may be prescribed a vacuum pump with a ring.  · Penile implant surgery may be performed. You may receive:  · An inflatable implant.  · A semi-rigid implant.  · Blood vessel surgery may be performed.  HOME CARE INSTRUCTIONS  · Take all medicine as directed by your caregiver. Do not take any other medicines without talking to your caregiver first.  · Follow your caregiver's directions for specific treatments as prescribed.  · Follow up with your caregiver as directed.  Document Released: 08/01/2000 Document Revised: 10/27/2011 Document Reviewed: 11/24/2010  [employer]® Patient Information ©2013 [employer].

## 2013-04-11 ENCOUNTER — Telehealth: Payer: Self-pay | Admitting: Family Medicine

## 2013-04-11 ENCOUNTER — Ambulatory Visit (INDEPENDENT_AMBULATORY_CARE_PROVIDER_SITE_OTHER): Payer: PRIVATE HEALTH INSURANCE | Admitting: Family Medicine

## 2013-04-11 VITALS — BP 122/78 | HR 85 | Temp 98.2°F | Resp 18 | Ht 67.0 in | Wt 159.0 lb

## 2013-04-11 DIAGNOSIS — K602 Anal fissure, unspecified: Secondary | ICD-10-CM

## 2013-04-11 DIAGNOSIS — K6289 Other specified diseases of anus and rectum: Secondary | ICD-10-CM

## 2013-04-11 MED ORDER — DILTIAZEM GEL 2 %: CUTANEOUS | Status: DC

## 2013-04-11 MED ORDER — FLUCONAZOLE 150 MG PO TABS: 150.0000 mg | ORAL_TABLET | Freq: Once | ORAL | Status: DC

## 2013-04-11 NOTE — Progress Notes (Signed)
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   No itching.  Painful BM Has had this before.  Objective:  NAD Anus:  Small 5 mm skin tag.  Mild proctitis.  Assessment:  Proctitis  .kldiag Anal fissure - Plan: diltiazem 2 % GEL, fluconazole (DIFLUCAN) 150 MG tablet  Proctitis  Signed, Elvina Sidle, MD

## 2013-04-11 NOTE — Telephone Encounter (Signed)
I sent it to the pharmacy

## 2013-04-11 NOTE — Telephone Encounter (Signed)
Patient cannot get his Diltiazem gel filled at [facility]. He would like it sent to [facility].

## 2013-05-25 ENCOUNTER — Ambulatory Visit (INDEPENDENT_AMBULATORY_CARE_PROVIDER_SITE_OTHER): Payer: PRIVATE HEALTH INSURANCE | Admitting: Family Medicine

## 2013-05-25 VITALS — BP 142/76 | HR 73 | Temp 98.0°F | Resp 16 | Ht 65.0 in | Wt 157.0 lb

## 2013-05-25 DIAGNOSIS — Z23 Encounter for immunization: Secondary | ICD-10-CM

## 2013-05-25 DIAGNOSIS — M79609 Pain in unspecified limb: Secondary | ICD-10-CM

## 2013-05-25 DIAGNOSIS — M79642 Pain in left hand: Secondary | ICD-10-CM

## 2013-05-25 DIAGNOSIS — M545 Low back pain: Secondary | ICD-10-CM

## 2013-05-25 NOTE — Progress Notes (Addendum)
Subjective:    Patient ID: Grant Wilson, male    DOB: 1966-07-04, 47 y.o.   MRN: [id number]  This chart was scribed for Grant Staggers, MD by Greggory Stallion, ED Scribe. This patient's care was started at 6:30 PM.  HPI  HPI Comments: Grant Wilson is a 47 y.o. male who presents to the office complaining of gradual onset, constant left hand pain that started one month ago. He states when he bends his fingers back, the pain worsens and he hears a pop. Pt denies injury. He denies weakness. Pt has not taken any medications for his symptoms. He states he also has intermittent lower back pain that started 2 months ago. He states it starts when he sits for too long. Pt denies injury. He has not tried warm compresses or medications. Pt has been seen for his back pain before and given an orthopedic referral. He states the pain went away so he never went to orthopedics. An xray was done in April 2013 that showed mild degenerative changes. He denies leg weakness, bowel or bladder incontinence and numbness in his genital area.   Past Medical History  Diagnosis Date  . Hepatitis B   . GERD (gastroesophageal reflux disease)   . Allergy   . Hyperlipidemia   History reviewed. No pertinent past surgical history. History   Social History  . Marital Status: Single    Spouse Name: N/A    Number of Children: N/A  . Years of Education: N/A   Occupational History  . Not on file.   Social History Main Topics  . Smoking status: Never Smoker   . Smokeless tobacco: Not on file  . Alcohol Use: No  . Drug Use: No  . Sexual Activity: Not on file   Other Topics Concern  . Not on file   Social History Narrative  . No narrative on file  No Known Allergies Prior to Admission medications   Medication Sig Start Date End Date Taking? Authorizing Provider  Dexlansoprazole (DEXILANT PO) Take by mouth.   Yes Historical Provider, MD  tenofovir (VIREAD) 300 MG tablet Take 300 mg by mouth daily.   Yes  Historical Provider, MD  azithromycin (ZITHROMAX) 500 MG tablet Take 1 tablet (500 mg total) by mouth daily. 10/04/12   Sherlyn Hay, MD  dextromethorphan-guaiFENesin [facility] DM) 30-600 MG per 12 hr tablet Take 1 tablet by mouth every 12 (twelve) hours. 10/04/12   Sherlyn Hay, MD  diltiazem 2 % GEL Apply pea sized amount to affected area three times per day as needed. 04/11/13   Morrell Riddle, PA-C  fluconazole (DIFLUCAN) 150 MG tablet Take 1 tablet (150 mg total) by mouth once. 04/11/13   Elvina Sidle, MD  lidocaine (XYLOCAINE) 5 % ointment Apply pea sized amount three times per day to affected area as needed. 11/07/12   Shade Flood, MD  methocarbamol (ROBAXIN-750) 750 MG tablet Take 1 tablet (750 mg total) by mouth 4 (four) times daily. 12/19/12   Jonita Albee, MD  sildenafil (VIAGRA) 100 MG tablet Take 1 tablet (100 mg total) by mouth daily as needed for erectile dysfunction. 12/19/12   Jonita Albee, MD   Filed Vitals:   05/25/13 1724  BP: 142/76  Pulse: 73  Temp: 98 F (36.7 C)  TempSrc: Oral  Resp: 16  Height: 5\' 5"  (1.651 m)  Weight: 157 lb (71.215 kg)  SpO2: 96%       Review of Systems  Genitourinary:  Negative for bowel or bladder incontinence.   Musculoskeletal: Positive for arthralgias and back pain.  Neurological: Negative for weakness and numbness.   No bowel or bladder incontinence, no saddle anesthesia, no lower extremity weakness.      Objective:   Physical Exam  Constitutional: He is oriented to person, place, and time. He appears well-developed and well-nourished. No distress.  HENT:  Head: Normocephalic and atraumatic.  Neck: Normal range of motion.  Cardiovascular: Normal rate and regular rhythm.   Pulmonary/Chest: Effort normal and breath sounds normal.  Musculoskeletal: Normal range of motion.  L-spine, notices tightness with flexion and extension. Able to heel and toe walk without difficulty. Minimal tenderness to para spinal muscles  L4 and L5 without significant spasms. Negative straight leg raise bilaterally. Left wrist has full ROM. No tenderness to palpation of wrist. Tinel's negative. Second PIP of left hand has slight tenderness to palpation. Full flexor and extensor strength. Full ROM.   Neurological: He is alert and oriented to person, place, and time. He displays no Babinski's sign on the right side. He displays no Babinski's sign on the left side.  Reflex Scores:      Patellar reflexes are 2+ on the right side and 2+ on the left side.      Achilles reflexes are 2+ on the right side and 2+ on the left side. Skin: Skin is warm and dry. He is not diaphoretic.  Psychiatric: He has a normal mood and affect. His behavior is normal.       Assessment & Plan:   ANH MANGANO is a 47 y.o. male Low back pain - recurrent, intermitten chronic LBP, but less now than eval inpast.  Underlying DJD.  Back care manual for HEp and tx options, tylenol if needed, rare ibuprofen (deferred mobic d/t other med warning). heat or ice if needed and recheck in next 1 month - sooner if worse.   Left hand pain - tendonitis vs OA /DJDat 2nd PIP, without known injury. Avoid "popping knuckles", tylenol as above, rom and recheck next month. rtc precautions.   Flu vaccine given.    Patient Instructions  Due to one of your medicines, the safest initial medicine would be tylenol for your back and hand pain. Only if this is not helping an occasional over the counter advil may be ok, but try to just use tylenol. Work on the treatments in the back care manual, and try to avoid "popping your knuckles". Recheck in next 3-4 weeks. Return to the clinic or go to the nearest emergency room if any of your symptoms worsen or new symptoms occur.    Need for prophylactic vaccination and inoculation against influenza - Plan: Flu Vaccine QUAD 36+ mos IM  I personally performed the services described in this documentation, which was scribed in my presence. The  recorded information has been reviewed and considered, and addended by me as needed.

## 2013-05-25 NOTE — Patient Instructions (Signed)
Due to one of your medicines, the safest initial medicine would be tylenol for your back and hand pain. Only if this is not helping an occasional over the counter advil may be ok, but try to just use tylenol. Work on the treatments in the back care manual, and try to avoid "popping your knuckles". Recheck in next 3-4 weeks. Return to the clinic or go to the nearest emergency room if any of your symptoms worsen or new symptoms occur.

## 2013-09-04 ENCOUNTER — Ambulatory Visit (INDEPENDENT_AMBULATORY_CARE_PROVIDER_SITE_OTHER): Payer: PRIVATE HEALTH INSURANCE | Admitting: Family Medicine

## 2013-09-04 VITALS — BP 110/74 | HR 67 | Temp 98.1°F | Resp 16 | Ht 66.75 in | Wt 156.0 lb

## 2013-09-04 DIAGNOSIS — Z8719 Personal history of other diseases of the digestive system: Secondary | ICD-10-CM

## 2013-09-04 DIAGNOSIS — B1910 Unspecified viral hepatitis B without hepatic coma: Secondary | ICD-10-CM

## 2013-09-04 DIAGNOSIS — R1031 Right lower quadrant pain: Secondary | ICD-10-CM

## 2013-09-04 LAB — POCT CBC
GRANULOCYTE PERCENT: 38.5 % (ref 37–80)
HCT, POC: 51.6 % (ref 43.5–53.7)
HEMOGLOBIN: 16.1 g/dL (ref 14.1–18.1)
Lymph, poc: 1.7 (ref 0.6–3.4)
MCH, POC: 31.3 pg — AB (ref 27–31.2)
MCHC: 31.2 g/dL — AB (ref 31.8–35.4)
MCV: 100.2 fL — AB (ref 80–97)
MID (CBC): 0.3 (ref 0–0.9)
MPV: 9.2 fL (ref 0–99.8)
PLATELET COUNT, POC: 187 10*3/uL (ref 142–424)
POC Granulocyte: 1.3 — AB (ref 2–6.9)
POC LYMPH PERCENT: 51.3 %L — AB (ref 10–50)
POC MID %: 10.2 %M (ref 0–12)
RBC: 5.15 M/uL (ref 4.69–6.13)
RDW, POC: 13.8 %
WBC: 3.4 10*3/uL — AB (ref 4.6–10.2)

## 2013-09-04 LAB — COMPREHENSIVE METABOLIC PANEL
ALBUMIN: 4.2 g/dL (ref 3.5–5.2)
ALT: 33 U/L (ref 0–53)
AST: 28 U/L (ref 0–37)
Alkaline Phosphatase: 38 U/L — ABNORMAL LOW (ref 39–117)
BUN: 15 mg/dL (ref 6–23)
CALCIUM: 9.1 mg/dL (ref 8.4–10.5)
CHLORIDE: 102 meq/L (ref 96–112)
CO2: 29 meq/L (ref 19–32)
Creat: 0.93 mg/dL (ref 0.50–1.35)
Glucose, Bld: 83 mg/dL (ref 70–99)
POTASSIUM: 4.5 meq/L (ref 3.5–5.3)
Sodium: 138 mEq/L (ref 135–145)
Total Bilirubin: 0.6 mg/dL (ref 0.3–1.2)
Total Protein: 7 g/dL (ref 6.0–8.3)

## 2013-09-04 MED ORDER — RANITIDINE HCL 150 MG PO TABS: 150.0000 mg | ORAL_TABLET | Freq: Two times a day (BID) | ORAL | Status: DC

## 2013-09-04 NOTE — Patient Instructions (Signed)
Try the zantac up to twice per day, keep follow up with your gastroenterologist in the next month. If pain increases or changes prior to then, return for recheck.  You should receive a call or letter about your lab results within the next week to 10 days.  Return to the clinic or go to the nearest emergency room if any of your symptoms worsen or new symptoms occur. Abdominal Pain, Adult Many things can cause abdominal pain. Usually, abdominal pain is not caused by a disease and will improve without treatment. It can often be observed and treated at home. Your health care provider will do a physical exam and possibly order blood tests and X-rays to help determine the seriousness of your pain. However, in many cases, more time must pass before a clear cause of the pain can be found. Before that point, your health care provider may not know if you need more testing or further treatment. HOME CARE INSTRUCTIONS  Monitor your abdominal pain for any changes. The following actions may help to alleviate any discomfort you are experiencing:  Only take over-the-counter or prescription medicines as directed by your health care provider.  Do not take laxatives unless directed to do so by your health care provider.  Try a clear liquid diet (broth, tea, or water) as directed by your health care provider. Slowly move to a bland diet as tolerated. SEEK MEDICAL CARE IF:  You have unexplained abdominal pain.  You have abdominal pain associated with nausea or diarrhea.  You have pain when you urinate or have a bowel movement.  You experience abdominal pain that wakes you in the night.  You have abdominal pain that is worsened or improved by eating food.  You have abdominal pain that is worsened with eating fatty foods. SEEK IMMEDIATE MEDICAL CARE IF:   Your pain does not go away within 2 hours.  You have a fever.  You keep throwing up (vomiting).  Your pain is felt only in portions of the abdomen, such  as the right side or the left lower portion of the abdomen.  You pass bloody or black tarry stools. MAKE SURE YOU:  Understand these instructions.   Will watch your condition.   Will get help right away if you are not doing well or get worse.  Document Released: 05/14/2005 Document Revised: 05/25/2013 Document Reviewed: 04/13/2013 [facility] Patient Information 2014 [employer].

## 2013-09-04 NOTE — Progress Notes (Addendum)
Subjective:    Patient ID: Grant Wilson, male    DOB: [birth year], 48 y.o.   MRN: [id number] This chart was scribed for Grant Ray, MD by Grant Wilson, Medical Scribe. This patient's care was started at 12:47 PM.  Abdominal Pain Pertinent negatives include no constipation, fever, nausea or vomiting.   HPI Comments: Grant Wilson is a 48 y.o. male who presents to the [facility] complaining of mild intermittent abdominal pain, most recent onset 1 month ago. States pain subsides when he eats and sometimes he hears abdominal sounds. Pt states pain has been off and off for 1 year. Pt also states he his taste buds have been off. Has taken acid reflux medication in the past but has not taken anything for present sx. BM have been normal recently but pt states he's had some constipation in the past. No hx of heart problems. Pt takes Viread for Hepatitis B. Denies melena, vomiting, nausea, heart burn, fever, night sweat, unexplained weight loss, urinary trouble.   Gastroenterologist: Dr. Amedeo Wilson - goes every 6 months, plans to see in 1 more month   Patient Active Problem List   Diagnosis Date Noted  . Chronic low back pain 07/22/2012  . Elevated cholesterol 05/12/2012  . Seasonal allergies 05/12/2012  . Hepatitis B 09/22/2011   Past Medical History  Diagnosis Date  . Hepatitis B   . GERD (gastroesophageal reflux disease)   . Allergy   . Hyperlipidemia    History reviewed. No pertinent past surgical history. No Known Allergies Prior to Admission medications   Medication Sig Start Date End Date Taking? Authorizing Provider  tenofovir (VIREAD) 300 MG tablet Take 300 mg by mouth daily.   Yes Historical Provider, MD  azithromycin (ZITHROMAX) 500 MG tablet Take 1 tablet (500 mg total) by mouth daily. 10/04/12   Grant Lown, MD  Dexlansoprazole (DEXILANT PO) Take by mouth.    Historical Provider, MD  dextromethorphan-guaiFENesin (MUCINEX DM) 30-600 MG per 12 hr  tablet Take 1 tablet by mouth every 12 (twelve) hours. 10/04/12   Grant Lown, MD  diltiazem 2 % GEL Apply pea sized amount to affected area three times per day as needed. 04/11/13   Grant Bale, Grant Wilson  fluconazole (DIFLUCAN) 150 MG tablet Take 1 tablet (150 mg total) by mouth once. 04/11/13   Grant Haber, MD  lidocaine (XYLOCAINE) 5 % ointment Apply pea sized amount three times per day to affected area as needed. 11/07/12   Grant Agreste, MD  methocarbamol (ROBAXIN-750) 750 MG tablet Take 1 tablet (750 mg total) by mouth 4 (four) times daily. 12/19/12   Grant Flaming, MD  sildenafil (VIAGRA) 100 MG tablet Take 1 tablet (100 mg total) by mouth daily as needed for erectile dysfunction. 12/19/12   Grant Flaming, MD   History   Social History  . Marital Status: Single    Spouse Name: N/A    Number of Children: N/A  . Years of Education: N/A   Occupational History  . Not on file.   Social History Main Topics  . Smoking status: Never Smoker   . Smokeless tobacco: Not on file  . Alcohol Use: No  . Drug Use: No  . Sexual Activity: Not on file   Other Topics Concern  . Not on file   Social History Narrative  . No narrative on file   Review of Systems  Constitutional: Negative for fever, diaphoresis and unexpected weight change.  Gastrointestinal: Positive for abdominal pain. Negative for nausea, vomiting, constipation and blood in stool.  Genitourinary: Negative for difficulty urinating.      Objective:   Physical Exam  Vitals reviewed. Constitutional: He is oriented to person, place, and time. He appears well-developed and well-nourished.  HENT:  Head: Normocephalic and atraumatic.  Right Ear: Tympanic membrane, external ear and ear canal normal.  Left Ear: Tympanic membrane, external ear and ear canal normal.  Nose: No rhinorrhea.  Mouth/Throat: Oropharynx is clear and moist and mucous membranes are normal. No oropharyngeal exudate or posterior oropharyngeal erythema.    Eyes: Conjunctivae are normal. Pupils are equal, round, and reactive to light.  Neck: Neck supple.  Cardiovascular: Normal rate, regular rhythm, normal heart sounds and intact distal pulses.   No murmur heard. Pulmonary/Chest: Effort normal and breath sounds normal. He has no wheezes. He has no rhonchi. He has no rales.  Abdominal: Soft. Bowel sounds are normal. He exhibits no distension and no mass. There is no tenderness. There is no rebound, no guarding and no CVA tenderness.  Lymphadenopathy:    He has no cervical adenopathy.  Neurological: He is alert and oriented to person, place, and time.  Skin: Skin is warm and dry. No rash noted.  Psychiatric: He has a normal mood and affect. His behavior is normal.    Filed Vitals:   09/04/13 1147  BP: 110/74  Pulse: 67  Temp: 98.1 F (36.7 C)  TempSrc: Oral  Resp: 16  Height: 5' 6.75" (1.695 m)  Weight: 156 lb (70.761 kg)  SpO2: 97%   Ultrasound of abdomen from 2013 reviewed: IMPRESSION:  Stable left lobe hepatic hemangioma. No new worrisome hepatic  abnormality identified.  Slight increase in size of a simple left renal cyst. 1.7 x 2.0 x 2.0 Incomplete visualization of the pancreatic tail was possible due to  shadowing by overlying gas.  Results for orders placed in visit on 09/04/13  POCT CBC      Result Value Range   WBC 3.4 (*) 4.6 - 10.2 K/uL   Lymph, poc 1.7  0.6 - 3.4   POC LYMPH PERCENT 51.3 (*) 10 - 50 %L   MID (cbc) 0.3  0 - 0.9   POC MID % 10.2  0 - 12 %M   POC Granulocyte 1.3 (*) 2 - 6.9   Granulocyte percent 38.5  37 - 80 %G   RBC 5.15  4.69 - 6.13 M/uL   Hemoglobin 16.1  14.1 - 18.1 g/dL   HCT, POC 51.6  43.5 - 53.7 %   MCV 100.2 (*) 80 - 97 fL   MCH, POC 31.3 (*) 27 - 31.2 pg   MCHC 31.2 (*) 31.8 - 35.4 g/dL   RDW, POC 13.8     Platelet Count, POC 187  142 - 424 K/uL   MPV 9.2  0 - 99.8 fL       Assessment & Plan:   Grant Wilson is a 48 y.o. male RLQ abdominal pain - Plan: POCT CBC,  Comprehensive metabolic panel  Hepatitis B infection - Plan: POCT CBC, Comprehensive metabolic panel Intermittent abd pain - mild, resolves with food. reassuring exam and CBC, CMP pending. Will try zantac BID as hx of GERD and off meds, and as bad taste at times may be reflux. Follow up with GI in next month as scheduled - sooner here if any worsening of pain prior.   hx of Hep B, Hx of renal cyst, but simple and  stable left hepatic hemangioma. Follow up as scheduled with GI.   Meds ordered this encounter  Medications  . ranitidine (ZANTAC) 150 MG tablet    Sig: Take 1 tablet (150 mg total) by mouth 2 (two) times daily.    Dispense:  60 tablet    Refill:  0   Patient Instructions  Try the zantac up to twice per day, keep follow up with your gastroenterologist in the next month. If pain increases or changes prior to then, return for recheck.  You should receive a call or letter about your lab results within the next week to 10 days.  Return to the clinic or go to the nearest emergency room if any of your symptoms worsen or new symptoms occur. Abdominal Pain, Adult Many things can cause abdominal pain. Usually, abdominal pain is not caused by a disease and will improve without treatment. It can often be observed and treated at home. Your health care provider will do a physical exam and possibly order blood tests and X-rays to help determine the seriousness of your pain. However, in many cases, more time must pass before a clear cause of the pain can be found. Before that point, your health care provider may not know if you need more testing or further treatment. HOME CARE INSTRUCTIONS  Monitor your abdominal pain for any changes. The following actions may help to alleviate any discomfort you are experiencing:  Only take over-the-counter or prescription medicines as directed by your health care provider.  Do not take laxatives unless directed to do so by your health care provider.  Try a  clear liquid diet (broth, tea, or water) as directed by your health care provider. Slowly move to a bland diet as tolerated. SEEK MEDICAL CARE IF:  You have unexplained abdominal pain.  You have abdominal pain associated with nausea or diarrhea.  You have pain when you urinate or have a bowel movement.  You experience abdominal pain that wakes you in the night.  You have abdominal pain that is worsened or improved by eating food.  You have abdominal pain that is worsened with eating fatty foods. SEEK IMMEDIATE MEDICAL CARE IF:   Your pain does not go away within 2 hours.  You have a fever.  You keep throwing up (vomiting).  Your pain is felt only in portions of the abdomen, such as the right side or the left lower portion of the abdomen.  You pass bloody or black tarry stools. MAKE SURE YOU:  Understand these instructions.   Will watch your condition.   Will get help right away if you are not doing well or get worse.  Document Released: 05/14/2005 Document Revised: 05/25/2013 Document Reviewed: 04/13/2013 [facility] Patient Information 2014 [place].       I personally performed the services described in this documentation, which was scribed in my presence. The recorded information has been reviewed and considered, and addended by me as needed.

## 2013-12-27 ENCOUNTER — Ambulatory Visit: Payer: PRIVATE HEALTH INSURANCE

## 2013-12-27 ENCOUNTER — Ambulatory Visit (INDEPENDENT_AMBULATORY_CARE_PROVIDER_SITE_OTHER): Payer: PRIVATE HEALTH INSURANCE | Admitting: Family Medicine

## 2013-12-27 VITALS — BP 124/72 | HR 75 | Temp 98.4°F | Resp 16 | Ht 65.75 in | Wt 160.0 lb

## 2013-12-27 DIAGNOSIS — H579 Unspecified disorder of eye and adnexa: Secondary | ICD-10-CM

## 2013-12-27 DIAGNOSIS — M25519 Pain in unspecified shoulder: Secondary | ICD-10-CM

## 2013-12-27 DIAGNOSIS — R6889 Other general symptoms and signs: Secondary | ICD-10-CM

## 2013-12-27 DIAGNOSIS — G8929 Other chronic pain: Secondary | ICD-10-CM

## 2013-12-27 DIAGNOSIS — R1031 Right lower quadrant pain: Secondary | ICD-10-CM

## 2013-12-27 DIAGNOSIS — M545 Low back pain, unspecified: Secondary | ICD-10-CM

## 2013-12-27 DIAGNOSIS — T148XXA Other injury of unspecified body region, initial encounter: Secondary | ICD-10-CM

## 2013-12-27 LAB — POCT CBC
Granulocyte percent: 46.6 %G (ref 37–80)
HCT, POC: 51.1 % (ref 43.5–53.7)
Hemoglobin: 16.4 g/dL (ref 14.1–18.1)
Lymph, poc: 2.4 (ref 0.6–3.4)
MCH, POC: 31.5 pg — AB (ref 27–31.2)
MCHC: 32.1 g/dL (ref 31.8–35.4)
MCV: 98.1 fL — AB (ref 80–97)
MID (cbc): 0.5 (ref 0–0.9)
MPV: 9.3 fL (ref 0–99.8)
POC Granulocyte: 2.5 (ref 2–6.9)
POC LYMPH PERCENT: 44.7 % (ref 10–50)
POC MID %: 8.7 %M (ref 0–12)
Platelet Count, POC: 205 10*3/uL (ref 142–424)
RBC: 5.21 M/uL (ref 4.69–6.13)
RDW, POC: 14 %
WBC: 5.4 10*3/uL (ref 4.6–10.2)

## 2013-12-27 LAB — POCT URINALYSIS DIPSTICK
Bilirubin, UA: NEGATIVE
Glucose, UA: NEGATIVE
Ketones, UA: NEGATIVE
Leukocytes, UA: NEGATIVE
Nitrite, UA: NEGATIVE
Protein, UA: NEGATIVE
Spec Grav, UA: 1.01
Urobilinogen, UA: 0.2
pH, UA: 7

## 2013-12-27 LAB — POCT UA - MICROSCOPIC ONLY
Bacteria, U Microscopic: NEGATIVE
Casts, Ur, LPF, POC: NEGATIVE
Crystals, Ur, HPF, POC: NEGATIVE
Epithelial cells, urine per micros: NEGATIVE
Mucus, UA: NEGATIVE
RBC, urine, microscopic: NEGATIVE
Yeast, UA: NEGATIVE

## 2013-12-27 MED ORDER — OLOPATADINE HCL 0.1 % OP SOLN: 1.0000 [drp] | Freq: Two times a day (BID) | OPHTHALMIC | Status: DC

## 2013-12-27 MED ORDER — METHOCARBAMOL 750 MG PO TABS: 750.0000 mg | ORAL_TABLET | Freq: Every evening | ORAL | Status: DC | PRN

## 2013-12-27 NOTE — Progress Notes (Signed)
Chief Complaint:  Chief Complaint  Patient presents with  . Arm Pain    Right arm x's 2 weeks  . Flank Pain    right sided flanks pain when leaning forward x's 1 mo  . Allergic Rhinitis     HPI: Grant Wilson is a 48 y.o. male who is here for : 1. A 2 week hisotry of bilateral shoulder pain, he ahs ahd pain in his joints in the past. NKI, the right shoudler actually hurts him more. He denies OA or RA however the pain usually starts in the morning when he gets up and lasts for about 2 hours, he feels like it is achey and deep and on his lateral and back of his shoulder . 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 He used to do a lot of repetitive motion 2 years. Worse pi6-8/10 pain in the morning. Has not tried anything for this. OF pertinent interest he does about 50 push ups every day. No msk weakness numbness or tingling  2. Has itchy eyes and scratchy throat  3. 1 month hx of right sided flank and Right mid quadrant abd pain. He has not had any n/v/fevers or chills. No urinary sxs.He has been treated for Hep B in the past. No renal stones that he knows of.   Past Medical History  Diagnosis Date  . Hepatitis B   . GERD (gastroesophageal reflux disease)   . Allergy   . Hyperlipidemia    No past surgical history on file. History   Social History  . Marital Status: Single    Spouse Name: N/A    Number of Children: N/A  . Years of Education: N/A   Social History Main Topics  . Smoking status: Never Smoker   . Smokeless tobacco: None  . Alcohol Use: No  . Drug Use: No  . Sexual Activity: None   Other Topics Concern  . None   Social History Narrative  . None   No family history on file. No Known Allergies Prior to Admission medications   Medication Sig Start Date End Date Taking? Authorizing Provider  tenofovir (VIREAD) 300 MG tablet Take 300 mg by mouth daily.   Yes Historical Provider, MD  azithromycin  (ZITHROMAX) 500 MG tablet Take 1 tablet (500 mg total) by mouth daily. 10/04/12   Boris Lown, MD  Dexlansoprazole (DEXILANT PO) Take by mouth.    Historical Provider, MD  dextromethorphan-guaiFENesin (MUCINEX DM) 30-600 MG per 12 hr tablet Take 1 tablet by mouth every 12 (twelve) hours. 10/04/12   Boris Lown, MD  diltiazem 2 % GEL Apply pea sized amount to affected area three times per day as needed. 04/11/13   Mancel Bale, PA-C  fluconazole (DIFLUCAN) 150 MG tablet Take 1 tablet (150 mg total) by mouth once. 04/11/13   Robyn Haber, MD  lidocaine (XYLOCAINE) 5 % ointment Apply pea sized amount three times per day to affected area as needed. 11/07/12   Wendie Agreste, MD  methocarbamol (ROBAXIN-750) 750 MG tablet Take 1 tablet (750 mg total) by mouth 4 (four) times daily. 12/19/12   Orma Flaming, MD  ranitidine (ZANTAC) 150 MG tablet Take 1 tablet (150 mg total) by mouth 2 (two) times daily. 09/04/13   Wendie Agreste, MD  sildenafil (VIAGRA) 100 MG tablet Take 1 tablet (100 mg total) by mouth daily as needed for erectile  dysfunction. 12/19/12   Orma Flaming, MD     ROS: The patient denies fevers, chills, night sweats, unintentional weight loss, chest pain, palpitations, wheezing, dyspnea on exertion, nausea, vomiting  dysuria, hematuria, melena, numbness, weakness, or tingling.   All other systems have been reviewed and were otherwise negative with the exception of those mentioned in the HPI and as above.    PHYSICAL EXAM: Filed Vitals:   12/27/13 1936  BP: 124/72  Pulse: 75  Temp: 98.4 F (36.9 C)  Resp: 16   Filed Vitals:   12/27/13 1936  Height: 5' 5.75" (1.67 m)  Weight: 160 lb (72.576 kg)   Body mass index is 26.02 kg/(m^2).  General: Alert, no acute distress HEENT:  Normocephalic, atraumatic, oropharynx patent. EOMI, PERRLA Cardiovascular:  Regular rate and rhythm, no rubs murmurs or gallops.  No Carotid bruits, radial pulse intact. No pedal edema.    Respiratory: Clear to auscultation bilaterally.  No wheezes, rales, or rhonchi.  No cyanosis, no use of accessory musculature GI: No organomegaly, abdomen is soft and non-tender, positive bowel sounds.  No masses. Skin: No rashes. Neurologic: Facial musculature symmetric. Psychiatric: Patient is appropriate throughout our interaction. Lymphatic: No cervical lymphadenopathy Musculoskeletal: Gait intact. Neck exam normal, neg spurling Right and left shoulder Left is normal Right minimal lateral deltoidn tnedrness and also + ER pain 5/5 str, 2/2 DTRS, sensation intact Neg empty can, neg liftoff   LABS: Results for orders placed in visit on 12/27/13  POCT CBC      Result Value Ref Range   WBC 5.4  4.6 - 10.2 K/uL   Lymph, poc 2.4  0.6 - 3.4   POC LYMPH PERCENT 44.7  10 - 50 %L   MID (cbc) 0.5  0 - 0.9   POC MID % 8.7  0 - 12 %M   POC Granulocyte 2.5  2 - 6.9   Granulocyte percent 46.6  37 - 80 %G   RBC 5.21  4.69 - 6.13 M/uL   Hemoglobin 16.4  14.1 - 18.1 g/dL   HCT, POC 51.1  43.5 - 53.7 %   MCV 98.1 (*) 80 - 97 fL   MCH, POC 315 (*) 27 - 31.2 pg   MCHC 32.1  31.8 - 35.4 g/dL   RDW, POC 14.0     Platelet Count, POC 205  142 - 424 K/uL   MPV 9.3  0 - 99.8 fL  POCT UA - MICROSCOPIC ONLY      Result Value Ref Range   WBC, Ur, HPF, POC 0-1     RBC, urine, microscopic neg     Bacteria, U Microscopic neg     Mucus, UA neg     Epithelial cells, urine per micros neg     Crystals, Ur, HPF, POC neg     Casts, Ur, LPF, POC neg     Yeast, UA neg    POCT URINALYSIS DIPSTICK      Result Value Ref Range   Color, UA yellow     Clarity, UA clear     Glucose, UA neg     Bilirubin, UA neg     Ketones, UA neg     Spec Grav, UA 1.010     Blood, UA trace-lysed     pH, UA 7.0     Protein, UA neg     Urobilinogen, UA 0.2     Nitrite, UA neg     Leukocytes, UA Negative  EKG/XRAY:   Primary read interpreted by Dr. Marin Comment at [facility] Neg for  fracture/dislocation   ASSESSMENT/PLAN: Encounter Diagnoses  Name Primary?  . Pain in joint, shoulder region Yes  . Abdominal pain, chronic, right lower quadrant   . Itchy eyes    Will give exercise for rotator cuff, refill robaxin for now. Hold off on any NSAIds until CMP returns Advise to modify activities and not sleep on his right side Will rule out Rheumatoid Rx patanol, otc nasacort and claritin or zyrtec   Gross sideeffects, risk and benefits, and alternatives of medications d/w patient. Patient is aware that all medications have potential sideeffects and we are unable to predict every sideeffect or drug-drug interaction that may occur.  Glenford Bayley, DO 12/27/2013 8:51 PM

## 2013-12-27 NOTE — Patient Instructions (Signed)

## 2013-12-28 LAB — COMPREHENSIVE METABOLIC PANEL
Albumin: 4.7 g/dL (ref 3.5–5.2)
Alkaline Phosphatase: 43 U/L (ref 39–117)
CO2: 30 mEq/L (ref 19–32)
Calcium: 9.4 mg/dL (ref 8.4–10.5)
Glucose, Bld: 94 mg/dL (ref 70–99)
Potassium: 4.6 mEq/L (ref 3.5–5.3)
Sodium: 138 mEq/L (ref 135–145)
Total Protein: 7.7 g/dL (ref 6.0–8.3)

## 2013-12-28 LAB — COMPREHENSIVE METABOLIC PANEL WITH GFR
ALT: 42 U/L (ref 0–53)
AST: 39 U/L — ABNORMAL HIGH (ref 0–37)
BUN: 17 mg/dL (ref 6–23)
Chloride: 100 meq/L (ref 96–112)
Creat: 1.06 mg/dL (ref 0.50–1.35)
Total Bilirubin: 0.5 mg/dL (ref 0.2–1.2)

## 2013-12-28 LAB — RHEUMATOID FACTOR: Rheumatoid fact SerPl-aCnc: <10 [IU]/mL (ref <=14)

## 2013-12-29 ENCOUNTER — Encounter: Payer: Self-pay | Admitting: Family Medicine

## 2014-01-03 ENCOUNTER — Other Ambulatory Visit: Payer: Self-pay | Admitting: Gastroenterology

## 2014-01-03 DIAGNOSIS — B181 Chronic viral hepatitis B without delta-agent: Secondary | ICD-10-CM

## 2014-01-13 ENCOUNTER — Ambulatory Visit
Admission: RE | Admit: 2014-01-13 | Discharge: 2014-01-13 | Disposition: A | Discharge status: Home / Self Care | Payer: PRIVATE HEALTH INSURANCE | Source: Ambulatory Visit | Attending: Gastroenterology | Admitting: Gastroenterology

## 2014-01-13 DIAGNOSIS — B181 Chronic viral hepatitis B without delta-agent: Secondary | ICD-10-CM

## 2014-05-13 ENCOUNTER — Ambulatory Visit (INDEPENDENT_AMBULATORY_CARE_PROVIDER_SITE_OTHER): Payer: PRIVATE HEALTH INSURANCE | Admitting: Physician Assistant

## 2014-05-13 VITALS — BP 132/82 | HR 57 | Temp 97.2°F | Resp 20 | Ht 66.5 in | Wt 155.0 lb

## 2014-05-13 DIAGNOSIS — S239XXA Sprain of unspecified parts of thorax, initial encounter: Secondary | ICD-10-CM

## 2014-05-13 DIAGNOSIS — M62830 Muscle spasm of back: Secondary | ICD-10-CM

## 2014-05-13 DIAGNOSIS — M5380 Other specified dorsopathies, site unspecified: Secondary | ICD-10-CM

## 2014-05-13 DIAGNOSIS — S29019A Strain of muscle and tendon of unspecified wall of thorax, initial encounter: Secondary | ICD-10-CM

## 2014-05-13 MED ORDER — CYCLOBENZAPRINE HCL 5 MG PO TABS: 5.0000 mg | ORAL_TABLET | Freq: Three times a day (TID) | ORAL | Status: DC | PRN

## 2014-05-13 MED ORDER — MELOXICAM 7.5 MG PO TABS: 7.5000 mg | ORAL_TABLET | Freq: Two times a day (BID) | ORAL | Status: DC | PRN

## 2014-05-13 NOTE — Patient Instructions (Signed)

## 2014-05-13 NOTE — Progress Notes (Signed)
   Subjective:    Patient ID: Grant Wilson, male    DOB: Dec 08, 1965, 48 y.o.   MRN: [id number]  Patient Active Problem List   Diagnosis Date Noted  . Chronic low back pain 07/22/2012  . Elevated cholesterol 05/12/2012  . Seasonal allergies 05/12/2012  . Hepatitis B 09/22/2011     Chief Complaint  Patient presents with  . Back Pain  . Shoulder Pain    Right     HPI This problem has been present for four days and is not associated with trauma.  Patient reports a history of right shoulder pain made worse with abduction, as well as a history of lumbago, both of which have been well controlled.  The patient reports that he is having pain in the right paraspinal, lower trapezius area made worse with movement, particularly right arm abduction and neck flexion. He denies painful breathing, chest pain, diaphoresis, RUQ pain, and is eating, drinking, defecating, and urinating normally.      The allergies, medications, surgical, family, and social history were reviewed by me and exist elsewhere in the encounter.     Review of Systems  Constitutional: Negative.   HENT: Negative.   Respiratory: Negative.   Cardiovascular: Negative.   Gastrointestinal: Negative.   Genitourinary: Negative.   Musculoskeletal: Positive for myalgias. Negative for arthralgias, gait problem, joint swelling, neck pain and neck stiffness.       Objective:   Physical Exam  Constitutional: He is oriented to person, place, and time. He appears well-developed and well-nourished.  HENT:  Head: Normocephalic.  Neck: Normal range of motion. Neck supple.  Cardiovascular: Normal rate, regular rhythm, normal heart sounds and intact distal pulses.   Pulmonary/Chest: Effort normal and breath sounds normal.  Abdominal: Soft. Bowel sounds are normal.  Musculoskeletal: Normal range of motion.       Arms: Neurological: He is alert and oriented to person, place, and time. He has normal reflexes.  Skin: Skin is warm and  dry.  Psychiatric: He has a normal mood and affect. His behavior is normal. Judgment and thought content normal.          Assessment & Plan:  Thoracic myofascial strain, initial encounter - Plan: meloxicam (MOBIC) 7.5 MG tablet  Muscle spasm of back - Plan: cyclobenzaprine (FLEXERIL) 5 MG tablet  Philis Fendt, MS, PA-C [facility] 05/13/2014 12:49 PM

## 2014-10-22 ENCOUNTER — Ambulatory Visit (INDEPENDENT_AMBULATORY_CARE_PROVIDER_SITE_OTHER): Payer: PRIVATE HEALTH INSURANCE | Admitting: Emergency Medicine

## 2014-10-22 VITALS — BP 116/70 | HR 82 | Temp 98.1°F | Resp 16 | Ht 66.5 in | Wt 159.5 lb

## 2014-10-22 DIAGNOSIS — M25519 Pain in unspecified shoulder: Secondary | ICD-10-CM

## 2014-10-22 DIAGNOSIS — M7552 Bursitis of left shoulder: Secondary | ICD-10-CM | POA: Diagnosis not present

## 2014-10-22 MED ORDER — METHYLPREDNISOLONE ACETATE 80 MG/ML IJ SUSP: 80.0000 mg | Freq: Once | INTRAMUSCULAR | Status: DC

## 2014-10-22 MED ORDER — NAPROXEN SODIUM 550 MG PO TABS: 550.0000 mg | ORAL_TABLET | Freq: Two times a day (BID) | ORAL | Status: DC

## 2014-10-22 NOTE — Progress Notes (Signed)
[facility] [address] [place] [address] [phone]  Date:  10/22/2014   Name:  Grant Wilson   DOB:  1965/10/04   MRN:  [id number]  PCP:  Kennon Portela, MD    Chief Complaint: Shoulder Pain   History of Present Illness:  MARKO SKALSKI is a 49 y.o. very pleasant male patient who presents with the following:  Several year history of pain in right shoulder.  No history of injury or overuse. Pain was intermittent and worse with raising arm or sleeping on that side Now pain is constant and has similar pain in left shoulder that is more severe. Not affected by sleeping on side but worse when arises. No gout or inflammatory joint disease. No improvement with over the counter medications or other home remedies. Denies other complaint or health concern today.   Patient Active Problem List   Diagnosis Date Noted  . Chronic low back pain 07/22/2012  . Elevated cholesterol 05/12/2012  . Seasonal allergies 05/12/2012  . Hepatitis B 09/22/2011    Past Medical History  Diagnosis Date  . Hepatitis B   . GERD (gastroesophageal reflux disease)   . Allergy   . Hyperlipidemia     History reviewed. No pertinent past surgical history.  History  Substance Use Topics  . Smoking status: Never Smoker   . Smokeless tobacco: Never Used  . Alcohol Use: No    History reviewed. No pertinent family history.  No Known Allergies  Medication list has been reviewed and updated.  Current Outpatient Prescriptions on File Prior to Visit  Medication Sig Dispense Refill  . tenofovir (VIREAD) 300 MG tablet Take 300 mg by mouth daily.     No current facility-administered medications on file prior to visit.    Review of Systems:  As per HPI, otherwise negative.    Physical Examination: Filed Vitals:   10/22/14 1311  BP: 116/70  Pulse: 82  Temp: 98.1 F (36.7 C)  Resp: 16   Filed Vitals:   10/22/14 1311  Height: 5' 6.5" (1.689 m)  Weight: 159  lb 8 oz (72.349 kg)   Body mass index is 25.36 kg/(m^2). Ideal Body Weight: Weight in (lb) to have BMI = 25: 156.9   GEN: WDWN, NAD, Non-toxic, Alert & Oriented x 3 HEENT: Atraumatic, Normocephalic.  Ears and Nose: No external deformity. EXTR: No clubbing/cyanosis/edema NEURO: Normal gait.  PSYCH: Normally interactive. Conversant. Not depressed or anxious appearing.  Calm demeanor.  Left shoulder:  Tender lateral shoulder.  Pain with PROM  No crepitus or erythema  Assessment and Plan: Shoulder bursitis Depo  Anaprox  Signed,  Ellison Carwin, MD

## 2014-10-22 NOTE — Patient Instructions (Signed)
Bursitis °Bursitis is a swelling and soreness (inflammation) of a fluid-filled sac (bursa) that overlies and protects a joint. It can be caused by injury, overuse of the joint, arthritis or infection. The joints most likely to be affected are the elbows, shoulders, hips and knees. °HOME CARE INSTRUCTIONS  °· Apply ice to the affected area for 15-20 minutes each hour while awake for 2 days. Put the ice in a plastic bag and place a towel between the bag of ice and your skin. °· Rest the injured joint as much as possible, but continue to put the joint through a full range of motion, 4 times per day. (The shoulder joint especially becomes rapidly "frozen" if not used.) When the pain lessens, begin normal slow movements and usual activities. °· Only take over-the-counter or prescription medicines for pain, discomfort or fever as directed by your caregiver. °· Your caregiver may recommend draining the bursa and injecting medicine into the bursa. This may help the healing process. °· Follow all instructions for follow-up with your caregiver. This includes any orthopedic referrals, physical therapy and rehabilitation. Any delay in obtaining necessary care could result in a delay or failure of the bursitis to heal and chronic pain. °SEEK IMMEDIATE MEDICAL CARE IF:  °· Your pain increases even during treatment. °· You develop an oral temperature above 102° F (38.9° C) and have heat and inflammation over the involved bursa. °MAKE SURE YOU:  °· Understand these instructions. °· Will watch your condition. °· Will get help right away if you are not doing well or get worse. °Document Released: 08/01/2000 Document Revised: 10/27/2011 Document Reviewed: 10/24/2013 °ExitCare® Patient Information ©2015 [employer]. This information is not intended to replace advice given to you by your health care provider. Make sure you discuss any questions you have with your health care provider. ° °

## 2014-11-14 ENCOUNTER — Ambulatory Visit (INDEPENDENT_AMBULATORY_CARE_PROVIDER_SITE_OTHER): Payer: PRIVATE HEALTH INSURANCE | Admitting: Family Medicine

## 2014-11-14 ENCOUNTER — Ambulatory Visit (INDEPENDENT_AMBULATORY_CARE_PROVIDER_SITE_OTHER): Payer: PRIVATE HEALTH INSURANCE

## 2014-11-14 VITALS — BP 128/80 | HR 85 | Temp 98.2°F | Resp 17 | Ht 67.5 in | Wt 159.0 lb

## 2014-11-14 DIAGNOSIS — M25512 Pain in left shoulder: Secondary | ICD-10-CM

## 2014-11-14 NOTE — Progress Notes (Signed)
Subjective:    Patient ID: Grant Wilson, male    DOB: 05/10/1966, 49 y.o.   MRN: [id number]  HPI Grant Wilson is a 49 year-old male who presents with increasing left shoulder pain. Onset was 4 months ago, without any known acute injury. Symptoms aggravated by overhead reaching. 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 He denies any significant neck pain. Denies any numbness, tingling, or weakness. Occasional popping in the left shoulder. Denies any pain that wakes him up unless he sleeps on it.   Seen about 1 month ago for right shoulder pain, given IM Depo-Medrol, which provided only very brief 1-day of relief. He did not take Naproxen, due to GI upset.  Past medical history, social history, medications, and allergies were reviewed and are up to date in the chart.  Review of Systems 7 point review of systems was performed and was otherwise negative unless noted in the history of present illness.     Objective:   Physical Exam BP 128/80 mmHg  Pulse 85  Temp(Src) 98.2 F (36.8 C) (Oral)  Resp 17  Ht 5' 7.5" (1.715 m)  Wt 159 lb (72.122 kg)  BMI 24.52 kg/m2  SpO2 98% GEN: The patient is well-developed well-nourished male and in no acute distress.  He is awake alert and oriented x3. SKIN: warm and well-perfused, no rash  EXTR: No lower extremity edema or calf tenderness Neuro: Strength 5/5 globally. Sensation intact throughout. No focal deficits. Vasc: +2 bilateral distal pulses. No edema.  MSK: Atrophy: none   Cervical ROM Full  Shoulder ROM: Right <---> Left   Forward flexion 180<--->170 ER at side 60<--->60 Abd ER 90<--->70 Abd IR 60<--->45 IR up back T10<--->T10  TTP:  AC joint:  -  Supraspinatus insertion:  +  Subsca/biceps:  -  Periscapular:  -  Trap:  -  Cuff: Impingement/cuff: Hawkins +   Jobes: +   FF strength:5/5   ER strength:5/5   Abdominal compression test:- Lag signs:None  Laxity/instabilty:   -  Yergason:  -           Speeds:  -    Crank:  -                     Active Compression: -  Neurovascular: Normal sensation to light touch in median ulnar and radial nerve distribution with good strength in hand intrinsics grip and EPL, 2+ radial pulse.  X-ray Left Shoulder (preliminary read by Dr. Natividad Brood, DO Sports Medicine Fellow): No acute fractures. 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 Otherwise unremarkable.  Left Subacromial Injection Procedure: The procedure was explained to the patient in detail, all questions were answered.  After obtaining informed verbal consent, the left shoulder was sterilely prepped with alcohol. Using strict sterile technique a 25 gauge, 1  inch needle was inserted inferior to the posterolateral edge of the left acromion.  The needle is directed medially and slightly anteriorly.  A mixture of 4 mL 1% lidocaine plain without epinephrine, 40mg  Depo-Medrol was injected without resistance. The needle was removed, and a sterile bandage was applied. The patient tolerated the procedure well and was observed for 5-10 minutes.  Post injection instructions, including signs and symptoms of complications were discussed.     Assessment & Plan:  1. Left shoulder pain due to rotator cuff impingement syndrome, subacromial injection today.  -Tolerated injection well with improvement in  symptoms. Post-injection cares discussed. -Thera-band provided and HEP demonstrated. -Offered tramadol, but patient declined -Avoid NSAIDs, as this had exacerbated his gastritis -Tylenol prn -Plan follow-up if needed. May want CST inj for right shoulder if he finds benefit with the left, as he seems to have a hx of similar symptoms on the right, though not as severe today. He will try the exercises for the right rotator cuff as well. -If left shoulder pain persists, consider referral to Sports Medicine or Ortho for further treatment options. Consider MRI.  Dr.  Linnell Fulling, DO Sports Medicine Fellow  Discussed with Dr. Carlota Raspberry.

## 2014-11-15 NOTE — Progress Notes (Addendum)
Xray read and patient discussed with Dr. Linnell Fulling.. Agree with assessment and plan of care per his note and injection discussed prior to procedure. Present for questions if needed for this.

## 2014-11-28 ENCOUNTER — Ambulatory Visit (INDEPENDENT_AMBULATORY_CARE_PROVIDER_SITE_OTHER): Payer: PRIVATE HEALTH INSURANCE

## 2014-11-28 ENCOUNTER — Ambulatory Visit (INDEPENDENT_AMBULATORY_CARE_PROVIDER_SITE_OTHER): Payer: PRIVATE HEALTH INSURANCE | Admitting: Family Medicine

## 2014-11-28 VITALS — BP 122/78 | HR 61 | Temp 97.4°F | Resp 17 | Ht 67.25 in | Wt 156.4 lb

## 2014-11-28 DIAGNOSIS — K219 Gastro-esophageal reflux disease without esophagitis: Secondary | ICD-10-CM | POA: Diagnosis not present

## 2014-11-28 DIAGNOSIS — R079 Chest pain, unspecified: Secondary | ICD-10-CM | POA: Diagnosis not present

## 2014-11-28 DIAGNOSIS — R0602 Shortness of breath: Secondary | ICD-10-CM

## 2014-11-28 DIAGNOSIS — R1013 Epigastric pain: Secondary | ICD-10-CM

## 2014-11-28 LAB — POCT CBC
Granulocyte percent: 46.6 %G (ref 37–80)
HEMATOCRIT: 51.6 % (ref 43.5–53.7)
HEMOGLOBIN: 16.9 g/dL (ref 14.1–18.1)
LYMPH, POC: 2.6 (ref 0.6–3.4)
MCH: 30.7 pg (ref 27–31.2)
MCHC: 32.7 g/dL (ref 31.8–35.4)
MCV: 93.9 fL (ref 80–97)
MID (cbc): 0.4 (ref 0–0.9)
MPV: 7 fL (ref 0–99.8)
POC Granulocyte: 2.6 (ref 2–6.9)
POC LYMPH PERCENT: 46.4 %L (ref 10–50)
POC MID %: 7 %M (ref 0–12)
Platelet Count, POC: 200 10*3/uL (ref 142–424)
RBC: 5.5 M/uL (ref 4.69–6.13)
RDW, POC: 14.5 %
WBC: 5.6 10*3/uL (ref 4.6–10.2)

## 2014-11-28 MED ORDER — ESOMEPRAZOLE MAGNESIUM 40 MG PO CPDR: 40.0000 mg | DELAYED_RELEASE_CAPSULE | Freq: Every day | ORAL | Status: DC

## 2014-11-28 NOTE — Patient Instructions (Signed)
Take the Nexium 40 mg daily for the next 1-2 months.   If your symptoms continue to persist we will need to do other testing and probably make a referral to a gastroenterologist for scoping.  Return sooner if you get worse at any time.  Gastroesophageal Reflux Disease, Adult Gastroesophageal reflux disease (GERD) happens when acid from your stomach flows up into the esophagus. When acid comes in contact with the esophagus, the acid causes soreness (inflammation) in the esophagus. Over time, GERD may create small holes (ulcers) in the lining of the esophagus. CAUSES   Increased body weight. This puts pressure on the stomach, making acid rise from the stomach into the esophagus.  Smoking. This increases acid production in the stomach.  Drinking alcohol. This causes decreased pressure in the lower esophageal sphincter (valve or ring of muscle between the esophagus and stomach), allowing acid from the stomach into the esophagus.  Late evening meals and a full stomach. This increases pressure and acid production in the stomach.  A malformed lower esophageal sphincter. Sometimes, no cause is found. SYMPTOMS   Burning pain in the lower part of the mid-chest behind the breastbone and in the mid-stomach area. This may occur twice a week or more often.  Trouble swallowing.  Sore throat.  Dry cough.  Asthma-like symptoms including chest tightness, shortness of breath, or wheezing. DIAGNOSIS  Your caregiver may be able to diagnose GERD based on your symptoms. In some cases, X-rays and other tests may be done to check for complications or to check the condition of your stomach and esophagus. TREATMENT  Your caregiver may recommend over-the-counter or prescription medicines to help decrease acid production. Ask your caregiver before starting or adding any new medicines.  HOME CARE INSTRUCTIONS   Change the factors that you can control. Ask your caregiver for guidance concerning weight loss,  quitting smoking, and alcohol consumption.  Avoid foods and drinks that make your symptoms worse, such as:  Caffeine or alcoholic drinks.  Chocolate.  Peppermint or mint flavorings.  Garlic and onions.  Spicy foods.  Citrus fruits, such as oranges, lemons, or limes.  Tomato-based foods such as sauce, chili, salsa, and pizza.  Fried and fatty foods.  Avoid lying down for the 3 hours prior to your bedtime or prior to taking a nap.  Eat small, frequent meals instead of large meals.  Wear loose-fitting clothing. Do not wear anything tight around your waist that causes pressure on your stomach.  Raise the head of your bed 6 to 8 inches with wood blocks to help you sleep. Extra pillows will not help.  Only take over-the-counter or prescription medicines for pain, discomfort, or fever as directed by your caregiver.  Do not take aspirin, ibuprofen, or other nonsteroidal anti-inflammatory drugs (NSAIDs). SEEK IMMEDIATE MEDICAL CARE IF:   You have pain in your arms, neck, jaw, teeth, or back.  Your pain increases or changes in intensity or duration.  You develop nausea, vomiting, or sweating (diaphoresis).  You develop shortness of breath, or you faint.  Your vomit is green, yellow, black, or looks like coffee grounds or blood.  Your stool is red, bloody, or black. These symptoms could be signs of other problems, such as heart disease, gastric bleeding, or esophageal bleeding. MAKE SURE YOU:   Understand these instructions.  Will watch your condition.  Will get help right away if you are not doing well or get worse. Document Released: 05/14/2005 Document Revised: 10/27/2011 Document Reviewed: 02/21/2011 ExitCare Patient Information 2015  [employer]. This information is not intended to replace advice given to you by your health care provider. Make sure you discuss any questions you have with your health care provider.

## 2014-11-28 NOTE — Progress Notes (Signed)
Subjective: 49 year old man from [place] who is been in the [place] for 20 years. He works at [employer]. He has been having some problems for a while but more recently he has some reflux type symptoms with hot coming up into his mouth. He hurts some in his epigastrium. He has had some chest pain and shortness of breath with that the last week or 2. He does not vomit. He is not coughing up anything but he does cough some. He denies any exertional chest chest pain. No diarrhea. He took some OTC and Nexium with a little bit of relief recently.  He got his shoulder injected not long ago and it still bothers him some. Apparently a referral is pending to somewhere on this he says.  Objective: No acute distress. Friendly, alert and oriented. His throat is clear. Neck supple without significant nodes. Chest clear to auscultation. Heart regular without murmurs. Abdomen soft without masses but there is mild epigastric tenderness. Normal bowel sounds.  Assessment: GERD, new evaluation Chest pains Shortness of breath Epigastric pain  Plan: Chest x-ray, EKG, and CBC  Results for orders placed or performed in visit on 11/28/14  POCT CBC  Result Value Ref Range   WBC 5.6 4.6 - 10.2 K/uL   Lymph, poc 2.6 0.6 - 3.4   POC LYMPH PERCENT 46.4 10 - 50 %L   MID (cbc) 0.4 0 - 0.9   POC MID % 7.0 0 - 12 %M   POC Granulocyte 2.6 2 - 6.9   Granulocyte percent 46.6 37 - 80 %G   RBC 5.50 4.69 - 6.13 M/uL   Hemoglobin 16.9 14.1 - 18.1 g/dL   HCT, POC 51.6 43.5 - 53.7 %   MCV 93.9 80 - 97 fL   MCH, POC 30.7 27 - 31.2 pg   MCHC 32.7 31.8 - 35.4 g/dL   RDW, POC 14.5 %   Platelet Count, POC 200 142 - 424 K/uL   MPV 7.0 0 - 99.8 fL   [facility] reading (PRIMARY) by  Dr. Linna Darner Normal chest  Ekg normal.  J wave elevation V2-3  Treat for GERD  Do H. pylori testing if symptoms continue to persist  .

## 2014-12-08 ENCOUNTER — Ambulatory Visit (INDEPENDENT_AMBULATORY_CARE_PROVIDER_SITE_OTHER): Payer: PRIVATE HEALTH INSURANCE | Admitting: Internal Medicine

## 2014-12-08 VITALS — BP 124/80 | HR 77 | Temp 98.3°F | Resp 17 | Ht 68.0 in | Wt 155.0 lb

## 2014-12-08 DIAGNOSIS — Z79899 Other long term (current) drug therapy: Secondary | ICD-10-CM

## 2014-12-08 DIAGNOSIS — Z0000 Encounter for general adult medical examination without abnormal findings: Secondary | ICD-10-CM

## 2014-12-08 DIAGNOSIS — B1910 Unspecified viral hepatitis B without hepatic coma: Secondary | ICD-10-CM

## 2014-12-08 DIAGNOSIS — Z23 Encounter for immunization: Secondary | ICD-10-CM

## 2014-12-08 DIAGNOSIS — K219 Gastro-esophageal reflux disease without esophagitis: Secondary | ICD-10-CM | POA: Diagnosis not present

## 2014-12-08 DIAGNOSIS — B169 Acute hepatitis B without delta-agent and without hepatic coma: Secondary | ICD-10-CM | POA: Diagnosis not present

## 2014-12-08 LAB — POCT UA - MICROSCOPIC ONLY
Bacteria, U Microscopic: NEGATIVE
Casts, Ur, LPF, POC: NEGATIVE
Crystals, Ur, HPF, POC: NEGATIVE
MUCUS UA: NEGATIVE
RBC, urine, microscopic: NEGATIVE
WBC, UR, HPF, POC: NEGATIVE
Yeast, UA: NEGATIVE

## 2014-12-08 LAB — POCT URINALYSIS DIPSTICK
Bilirubin, UA: NEGATIVE
Glucose, UA: NEGATIVE
Ketones, UA: NEGATIVE
Leukocytes, UA: NEGATIVE
Nitrite, UA: NEGATIVE
PROTEIN UA: NEGATIVE
SPEC GRAV UA: 1.015
Urobilinogen, UA: 0.2
pH, UA: 7.5

## 2014-12-08 LAB — POCT SEDIMENTATION RATE: POCT SED RATE: 7 mm/h (ref 0–22)

## 2014-12-08 NOTE — Patient Instructions (Addendum)
Immunization Schedule, Adult  Influenza vaccine.  All adults should be immunized every year.  All adults, including pregnant women and people with hives-only allergy to eggs can receive the inactivated influenza (IIV) vaccine.  Adults aged 49-49 years can receive the recombinant influenza (RIV) vaccine. The RIV vaccine does not contain any egg protein.  Adults aged 76 years or older can receive the standard-dose IIV or the high-dose IIV.  Tetanus, diphtheria, and acellular pertussis (Td, Tdap) vaccine.  Pregnant women should receive 1 dose of Tdap vaccine during each pregnancy. The dose should be obtained regardless of the length of time since the last dose. Immunization is preferred during the 27th to 36th week of gestation.  An adult who has not previously received Tdap or who does not know his or her vaccine status should receive 1 dose of Tdap. This initial dose should be followed by tetanus and diphtheria toxoids (Td) booster doses every 10 years.  Adults with an unknown or incomplete history of completing a 3-dose immunization series with Td-containing vaccines should begin or complete a primary immunization series including a Tdap dose.  Adults should receive a Td booster every 10 years.  Varicella vaccine.  An adult without evidence of immunity to varicella should receive 2 doses or a second dose if he or she has previously received 1 dose.  Pregnant females who do not have evidence of immunity should receive the first dose after pregnancy. This first dose should be obtained before leaving the health care facility. The second dose should be obtained 4-8 weeks after the first dose.  Human papillomavirus (HPV) vaccine.  Females aged 13-26 years who have not received the vaccine previously should obtain the 3-dose series.  The vaccine is not recommended for use in pregnant females. However, pregnancy testing is not needed before receiving a dose. If a male is found to be  pregnant after receiving a dose, no treatment is needed. In that case, the remaining doses should be delayed until after the pregnancy.  Males aged 37-21 years who have not received the vaccine previously should receive the 3-dose series. Males aged 22-26 years may be immunized.  Immunization is recommended through the age of 93 years for any male who has sex with males and did not get any or all doses earlier.  Immunization is recommended for any person with an immunocompromised condition through the age of 71 years if he or she did not get any or all doses earlier.  During the 3-dose series, the second dose should be obtained 4-8 weeks after the first dose. The third dose should be obtained 24 weeks after the first dose and 16 weeks after the second dose.  Zoster vaccine.  One dose is recommended for adults aged 73 years or older unless certain conditions are present.  Measles, mumps, and rubella (MMR) vaccine.  Adults born before 35 generally are considered immune to measles and mumps.  Adults born in [birth year] or later should have 1 or more doses of MMR vaccine unless there is a contraindication to the vaccine or there is laboratory evidence of immunity to each of the three diseases.  A routine second dose of MMR vaccine should be obtained at least 28 days after the first dose for students attending postsecondary schools, health care workers, or international travelers.  People who received inactivated measles vaccine or an unknown type of measles vaccine during 1963-1967 should receive 2 doses of MMR vaccine.  People who received inactivated mumps vaccine or an unknown type  of mumps vaccine before 1979 and are at high risk for mumps infection should consider immunization with 2 doses of MMR vaccine.  For females of childbearing age, rubella immunity should be determined. If there is no evidence of immunity, females who are not pregnant should be vaccinated. If there is no evidence of  immunity, females who are pregnant should delay immunization until after pregnancy.  Unvaccinated health care workers born before 1957 who lack laboratory evidence of measles, mumps, or rubella immunity or laboratory confirmation of disease should consider measles and mumps immunization with 2 doses of MMR vaccine or rubella immunization with 1 dose of MMR vaccine.  Pneumococcal 13-valent conjugate (PCV13) vaccine.  When indicated, a person who is uncertain of his or her immunization history and has no record of immunization should receive the PCV13 vaccine.  An adult aged 19 years or older who has certain medical conditions and has not been previously immunized should receive 1 dose of PCV13 vaccine. This PCV13 should be followed with a dose of pneumococcal polysaccharide (PPSV23) vaccine. The PPSV23 vaccine dose should be obtained at least 8 weeks after the dose of PCV13 vaccine.  An adult aged 19 years or older who has certain medical conditions and previously received 1 or more doses of PPSV23 vaccine should receive 1 dose of PCV13. The PCV13 vaccine dose should be obtained 1 or more years after the last PPSV23 vaccine dose.  Pneumococcal polysaccharide (PPSV23) vaccine.  When PCV13 is also indicated, PCV13 should be obtained first.  All adults aged 65 years and older should be immunized.  An adult younger than age 65 years who has certain medical conditions should be immunized.  Any person who resides in a nursing home or long-term care facility should be immunized.  An adult smoker should be immunized.  People with an immunocompromised condition and certain other conditions should receive both PCV13 and PPSV23 vaccines.  People with human immunodeficiency virus (HIV) infection should be immunized as soon as possible after diagnosis.  Immunization during chemotherapy or radiation therapy should be avoided.  Routine use of PPSV23 vaccine is not recommended for American Indians,  [place]atives, or people younger than 65 years unless there are medical conditions that require PPSV23 vaccine.  When indicated, people who have unknown immunization and have no record of immunization should receive PPSV23 vaccine.  One-time revaccination 5 years after the first dose of PPSV23 is recommended for people aged 19-64 years who have chronic kidney failure, nephrotic syndrome, asplenia, or immunocompromised conditions.  People who received 1-2 doses of PPSV23 before age 65 years should receive another dose of PPSV23 vaccine at age 65 years or later if at least 5 years have passed since the previous dose.  Doses of PPSV23 are not needed for people immunized with PPSV23 at or after age 65 years.  Meningococcal vaccine.  Adults with asplenia or persistent complement component deficiencies should receive 2 doses of quadrivalent meningococcal conjugate (MenACWY-D) vaccine. The doses should be obtained at least 2 months apart.  Microbiologists working with certain meningococcal bacteria, military recruits, people at risk during an outbreak, and people who travel to or live in countries with a high rate of meningitis should be immunized.  A first-year college student up through age 21 years who is living in a residence hall should receive a dose if he or she did not receive a dose on or after his or her 16th birthday.  Adults who have certain high-risk conditions should receive one or more doses   of vaccine.  Hepatitis A vaccine.  Adults who wish to be protected from this disease, have certain high-risk conditions, work with hepatitis A-infected animals, work in hepatitis A research labs, or travel to or work in countries with a high rate of hepatitis A should be immunized.  Adults who were previously unvaccinated and who anticipate close contact with an international adoptee during the first 60 days after arrival in the [place] from a country with a high rate of hepatitis A should  be immunized.  Hepatitis B vaccine.  Adults who wish to be protected from this disease, have certain high-risk conditions, may be exposed to blood or other infectious body fluids, are household contacts or sex partners of hepatitis B positive people, are clients or workers in certain care facilities, or travel to or work in countries with a high rate of hepatitis B should be immunized.  Haemophilus influenzae type b (Hib) vaccine.  A previously unvaccinated person with asplenia or sickle cell disease or having a scheduled splenectomy should receive 1 dose of Hib vaccine.  Regardless of previous immunization, a recipient of a hematopoietic stem cell transplant should receive a 3-dose series 6-12 months after his or her successful transplant.  Hib vaccine is not recommended for adults with HIV infection. Document Released: 10/25/2003 Document Revised: 11/29/2012 Document Reviewed: 09/21/2012 [facility] Patient Information 2015 [place], [place]. This information is not intended to replace advice given to you by your health care provider. Make sure you discuss any questions you have with your health care provider. Impingement Syndrome, Rotator Cuff, Bursitis with Rehab Impingement syndrome is a condition that involves inflammation of the tendons of the rotator cuff and the subacromial bursa, that causes pain in the shoulder. The rotator cuff consists of four tendons and muscles that control much of the shoulder and upper arm function. The subacromial bursa is a fluid filled sac that helps reduce friction between the rotator cuff and one of the bones of the shoulder (acromion). Impingement syndrome is usually an overuse injury that causes swelling of the bursa (bursitis), swelling of the tendon (tendonitis), and/or a tear of the tendon (strain). Strains are classified into three categories. Grade 1 strains cause pain, but the tendon is not lengthened. Grade 2 strains include a lengthened ligament, due to  the ligament being stretched or partially ruptured. With grade 2 strains there is still function, although the function may be decreased. Grade 3 strains include a complete tear of the tendon or muscle, and function is usually impaired. SYMPTOMS   Pain around the shoulder, often at the outer portion of the upper arm.  Pain that gets worse with shoulder function, especially when reaching overhead or lifting.  Sometimes, aching when not using the arm.  Pain that wakes you up at night.  Sometimes, tenderness, swelling, warmth, or redness over the affected area.  Loss of strength.  Limited motion of the shoulder, especially reaching behind the back (to the back pocket or to unhook bra) or across your body.  Crackling sound (crepitation) when moving the arm.  Biceps tendon pain and inflammation (in the front of the shoulder). Worse when bending the elbow or lifting. CAUSES  Impingement syndrome is often an overuse injury, in which chronic (repetitive) motions cause the tendons or bursa to become inflamed. A strain occurs when a force is paced on the tendon or muscle that is greater than it can withstand. Common mechanisms of injury include: Stress from sudden increase in duration, frequency, or intensity of training.  Direct hit (trauma) to the shoulder.  Aging, erosion of the tendon with normal use.  Bony bump on shoulder (acromial spur). RISK INCREASES WITH:  Contact sports (football, wrestling, boxing).  Throwing sports (baseball, tennis, volleyball).  Weightlifting and bodybuilding.  Heavy labor.  Previous injury to the rotator cuff, including impingement.  Poor shoulder strength and flexibility.  Failure to warm up properly before activity.  Inadequate protective equipment.  Old age.  Bony bump on shoulder (acromial spur). PREVENTION   Warm up and stretch properly before activity.  Allow for adequate recovery between workouts.  Maintain physical  fitness:  Strength, flexibility, and endurance.  Cardiovascular fitness.  Learn and use proper exercise technique. PROGNOSIS  If treated properly, impingement syndrome usually goes away within 6 weeks. Sometimes surgery is required.  RELATED COMPLICATIONS   Longer healing time if not properly treated, or if not given enough time to heal.  Recurring symptoms, that result in a chronic condition.  Shoulder stiffness, frozen shoulder, or loss of motion.  Rotator cuff tendon tear.  Recurring symptoms, especially if activity is resumed too soon, with overuse, with a direct blow, or when using poor technique. TREATMENT  Treatment first involves the use of ice and medicine, to reduce pain and inflammation. The use of strengthening and stretching exercises may help reduce pain with activity. These exercises may be performed at home or with a therapist. If non-surgical treatment is unsuccessful after more than 6 months, surgery may be advised. After surgery and rehabilitation, activity is usually possible in 3 months.  MEDICATION  If pain medicine is needed, nonsteroidal anti-inflammatory medicines (aspirin and ibuprofen), or other minor pain relievers (acetaminophen), are often advised.  Do not take pain medicine for 7 days before surgery.  Prescription pain relievers may be given, if your caregiver thinks they are needed. Use only as directed and only as much as you need.  Corticosteroid injections may be given by your caregiver. These injections should be reserved for the most serious cases, because they may only be given a certain number of times. HEAT AND COLD  Cold treatment (icing) should be applied for 10 to 15 minutes every 2 to 3 hours for inflammation and pain, and immediately after activity that aggravates your symptoms. Use ice packs or an ice massage.  Heat treatment may be used before performing stretching and strengthening activities prescribed by your caregiver, physical  therapist, or athletic trainer. Use a heat pack or a warm water soak. SEEK MEDICAL CARE IF:   Symptoms get worse or do not improve in 4 to 6 weeks, despite treatment.  New, unexplained symptoms develop. (Drugs used in treatment may produce side effects.) EXERCISES  RANGE OF MOTION (ROM) AND STRETCHING EXERCISES - Impingement Syndrome (Rotator Cuff  Tendinitis, Bursitis) These exercises may help you when beginning to rehabilitate your injury. Your symptoms may go away with or without further involvement from your physician, physical therapist or athletic trainer. While completing these exercises, remember:   Restoring tissue flexibility helps normal motion to return to the joints. This allows healthier, less painful movement and activity.  An effective stretch should be held for at least 30 seconds.  A stretch should never be painful. You should only feel a gentle lengthening or release in the stretched tissue. STRETCH - Flexion, Standing  Stand with good posture. With an underhand grip on your right / left hand, and an overhand grip on the opposite hand, grasp a broomstick or cane so that your hands are a little more  than shoulder width apart.  Keeping your right / left elbow straight and shoulder muscles relaxed, push the stick with your opposite hand, to raise your right / left arm in front of your body and then overhead. Raise your arm until you feel a stretch in your right / left shoulder, but before you have increased shoulder pain.  Try to avoid shrugging your right / left shoulder as your arm rises, by keeping your shoulder blade tucked down and toward your mid-back spine. Hold for __________ seconds.  Slowly return to the starting position. Repeat __________ times. Complete this exercise __________ times per day. STRETCH - Abduction, Supine  Lie on your back. With an underhand grip on your right / left hand and an overhand grip on the opposite hand, grasp a broomstick or cane so  that your hands are a little more than shoulder width apart.  Keeping your right / left elbow straight and your shoulder muscles relaxed, push the stick with your opposite hand, to raise your right / left arm out to the side of your body and then overhead. Raise your arm until you feel a stretch in your right / left shoulder, but before you have increased shoulder pain.  Try to avoid shrugging your right / left shoulder as your arm rises, by keeping your shoulder blade tucked down and toward your mid-back spine. Hold for __________ seconds.  Slowly return to the starting position. Repeat __________ times. Complete this exercise __________ times per day. ROM - Flexion, Active-Assisted  Lie on your back. You may bend your knees for comfort.  Grasp a broomstick or cane so your hands are about shoulder width apart. Your right / left hand should grip the end of the stick, so that your hand is positioned "thumbs-up," as if you were about to shake hands.  Using your healthy arm to lead, raise your right / left arm overhead, until you feel a gentle stretch in your shoulder. Hold for __________ seconds.  Use the stick to assist in returning your right / left arm to its starting position. Repeat __________ times. Complete this exercise __________ times per day.  ROM - Internal Rotation, Supine   Lie on your back on a firm surface. Place your right / left elbow about 60 degrees away from your side. Elevate your elbow with a folded towel, so that the elbow and shoulder are the same height.  Using a broomstick or cane and your strong arm, pull your right / left hand toward your body until you feel a gentle stretch, but no increase in your shoulder pain. Keep your shoulder and elbow in place throughout the exercise.  Hold for __________ seconds. Slowly return to the starting position. Repeat __________ times. Complete this exercise __________ times per day. STRETCH - Internal Rotation  Place your right  / left hand behind your back, palm up.  Throw a towel or belt over your opposite shoulder. Grasp the towel with your right / left hand.  While keeping an upright posture, gently pull up on the towel, until you feel a stretch in the front of your right / left shoulder.  Avoid shrugging your right / left shoulder as your arm rises, by keeping your shoulder blade tucked down and toward your mid-back spine.  Hold for __________ seconds. Release the stretch, by lowering your healthy hand. Repeat __________ times. Complete this exercise __________ times per day. ROM - Internal Rotation   Using an underhand grip, grasp a stick behind your  back with both hands.  While standing upright with good posture, slide the stick up your back until you feel a mild stretch in the front of your shoulder.  Hold for __________ seconds. Slowly return to your starting position. Repeat __________ times. Complete this exercise __________ times per day.  STRETCH - Posterior Shoulder Capsule   Stand or sit with good posture. Grasp your right / left elbow and draw it across your chest, keeping it at the same height as your shoulder.  Pull your elbow, so your upper arm comes in closer to your chest. Pull until you feel a gentle stretch in the back of your shoulder.  Hold for __________ seconds. Repeat __________ times. Complete this exercise __________ times per day. STRENGTHENING EXERCISES - Impingement Syndrome (Rotator Cuff Tendinitis, Bursitis) These exercises may help you when beginning to rehabilitate your injury. They may resolve your symptoms with or without further involvement from your physician, physical therapist or athletic trainer. While completing these exercises, remember:  Muscles can gain both the endurance and the strength needed for everyday activities through controlled exercises.  Complete these exercises as instructed by your physician, physical therapist or athletic trainer. Increase the  resistance and repetitions only as guided.  You may experience muscle soreness or fatigue, but the pain or discomfort you are trying to eliminate should never worsen during these exercises. If this pain does get worse, stop and make sure you are following the directions exactly. If the pain is still present after adjustments, discontinue the exercise until you can discuss the trouble with your clinician.  During your recovery, avoid activity or exercises which involve actions that place your injured hand or elbow above your head or behind your back or head. These positions stress the tissues which you are trying to heal. STRENGTH - Scapular Depression and Adduction   With good posture, sit on a firm chair. Support your arms in front of you, with pillows, arm rests, or on a table top. Have your elbows in line with the sides of your body.  Gently draw your shoulder blades down and toward your mid-back spine. Gradually increase the tension, without tensing the muscles along the top of your shoulders and the back of your neck.  Hold for __________ seconds. Slowly release the tension and relax your muscles completely before starting the next repetition.  After you have practiced this exercise, remove the arm support and complete the exercise in standing as well as sitting position. Repeat __________ times. Complete this exercise __________ times per day.  STRENGTH - Shoulder Abductors, Isometric  With good posture, stand or sit about 4-6 inches from a wall, with your right / left side facing the wall.  Bend your right / left elbow. Gently press your right / left elbow into the wall. Increase the pressure gradually, until you are pressing as hard as you can, without shrugging your shoulder or increasing any shoulder discomfort.  Hold for __________ seconds.  Release the tension slowly. Relax your shoulder muscles completely before you begin the next repetition. Repeat __________ times. Complete  this exercise __________ times per day.  STRENGTH - External Rotators, Isometric  Keep your right / left elbow at your side and bend it 90 degrees.  Step into a door frame so that the outside of your right / left wrist can press against the door frame without your upper arm leaving your side.  Gently press your right / left wrist into the door frame, as if you were trying  to swing the back of your hand away from your stomach. Gradually increase the tension, until you are pressing as hard as you can, without shrugging your shoulder or increasing any shoulder discomfort.  Hold for __________ seconds.  Release the tension slowly. Relax your shoulder muscles completely before you begin the next repetition. Repeat __________ times. Complete this exercise __________ times per day.  STRENGTH - Supraspinatus   Stand or sit with good posture. Grasp a __________ weight, or an exercise band or tubing, so that your hand is "thumbs-up," like you are shaking hands.  Slowly lift your right / left arm in a "V" away from your thigh, diagonally into the space between your side and straight ahead. Lift your hand to shoulder height or as far as you can, without increasing any shoulder pain. At first, many people do not lift their hands above shoulder height.  Avoid shrugging your right / left shoulder as your arm rises, by keeping your shoulder blade tucked down and toward your mid-back spine.  Hold for __________ seconds. Control the descent of your hand, as you slowly return to your starting position. Repeat __________ times. Complete this exercise __________ times per day.  STRENGTH - External Rotators  Secure a rubber exercise band or tubing to a fixed object (table, pole) so that it is at the same height as your right / left elbow when you are standing or sitting on a firm surface.  Stand or sit so that the secured exercise band is at your uninjured side.  Bend your right / left elbow 90 degrees. Place  a folded towel or small pillow under your right / left arm, so that your elbow is a few inches away from your side.  Keeping the tension on the exercise band, pull it away from your body, as if pivoting on your elbow. Be sure to keep your body steady, so that the movement is coming only from your rotating shoulder.  Hold for __________ seconds. Release the tension in a controlled manner, as you return to the starting position. Repeat __________ times. Complete this exercise __________ times per day.  STRENGTH - Internal Rotators   Secure a rubber exercise band or tubing to a fixed object (table, pole) so that it is at the same height as your right / left elbow when you are standing or sitting on a firm surface.  Stand or sit so that the secured exercise band is at your right / left side.  Bend your elbow 90 degrees. Place a folded towel or small pillow under your right / left arm so that your elbow is a few inches away from your side.  Keeping the tension on the exercise band, pull it across your body, toward your stomach. Be sure to keep your body steady, so that the movement is coming only from your rotating shoulder.  Hold for __________ seconds. Release the tension in a controlled manner, as you return to the starting position. Repeat __________ times. Complete this exercise __________ times per day.  STRENGTH - Scapular Protractors, Standing   Stand arms length away from a wall. Place your hands on the wall, keeping your elbows straight.  Begin by dropping your shoulder blades down and toward your mid-back spine.  To strengthen your protractors, keep your shoulder blades down, but slide them forward on your rib cage. It will feel as if you are lifting the back of your rib cage away from the wall. This is a subtle motion and can  be challenging to complete. Ask your caregiver for further instruction, if you are not sure you are doing the exercise correctly.  Hold for __________ seconds.  Slowly return to the starting position, resting the muscles completely before starting the next repetition. Repeat __________ times. Complete this exercise __________ times per day. STRENGTH - Scapular Protractors, Supine  Lie on your back on a firm surface. Extend your right / left arm straight into the air while holding a __________ weight in your hand.  Keeping your head and back in place, lift your shoulder off the floor.  Hold for __________ seconds. Slowly return to the starting position, and allow your muscles to relax completely before starting the next repetition. Repeat __________ times. Complete this exercise __________ times per day. STRENGTH - Scapular Protractors, Quadruped  Get onto your hands and knees, with your shoulders directly over your hands (or as close as you can be, comfortably).  Keeping your elbows locked, lift the back of your rib cage up into your shoulder blades, so your mid-back rounds out. Keep your neck muscles relaxed.  Hold this position for __________ seconds. Slowly return to the starting position and allow your muscles to relax completely before starting the next repetition. Repeat __________ times. Complete this exercise __________ times per day.  STRENGTH - Scapular Retractors  Secure a rubber exercise band or tubing to a fixed object (table, pole), so that it is at the height of your shoulders when you are either standing, or sitting on a firm armless chair.  With a palm down grip, grasp an end of the band in each hand. Straighten your elbows and lift your hands straight in front of you, at shoulder height. Step back, away from the secured end of the band, until it becomes tense.  Squeezing your shoulder blades together, draw your elbows back toward your sides, as you bend them. Keep your upper arms lifted away from your body throughout the exercise.  Hold for __________ seconds. Slowly ease the tension on the band, as you reverse the directions and  return to the starting position. Repeat __________ times. Complete this exercise __________ times per day. STRENGTH - Shoulder Extensors   Secure a rubber exercise band or tubing to a fixed object (table, pole) so that it is at the height of your shoulders when you are either standing, or sitting on a firm armless chair.  With a thumbs-up grip, grasp an end of the band in each hand. Straighten your elbows and lift your hands straight in front of you, at shoulder height. Step back, away from the secured end of the band, until it becomes tense.  Squeezing your shoulder blades together, pull your hands down to the sides of your thighs. Do not allow your hands to go behind you.  Hold for __________ seconds. Slowly ease the tension on the band, as you reverse the directions and return to the starting position. Repeat __________ times. Complete this exercise __________ times per day.  STRENGTH - Scapular Retractors and External Rotators   Secure a rubber exercise band or tubing to a fixed object (table, pole) so that it is at the height as your shoulders, when you are either standing, or sitting on a firm armless chair.  With a palm down grip, grasp an end of the band in each hand. Bend your elbows 90 degrees and lift your elbows to shoulder height, at your sides. Step back, away from the secured end of the band, until it becomes tense.  Squeezing your shoulder blades together, rotate your shoulders so that your upper arms and elbows remain stationary, but your fists travel upward to head height.  Hold for __________ seconds. Slowly ease the tension on the band, as you reverse the directions and return to the starting position. Repeat __________ times. Complete this exercise __________ times per day.  STRENGTH - Scapular Retractors and External Rotators, Rowing   Secure a rubber exercise band or tubing to a fixed object (table, pole) so that it is at the height of your shoulders, when you are  either standing, or sitting on a firm armless chair.  With a palm down grip, grasp an end of the band in each hand. Straighten your elbows and lift your hands straight in front of you, at shoulder height. Step back, away from the secured end of the band, until it becomes tense.  Step 1: Squeeze your shoulder blades together. Bending your elbows, draw your hands to your chest, as if you are rowing a boat. At the end of this motion, your hands and elbow should be at shoulder height and your elbows should be out to your sides.  Step 2: Rotate your shoulders, to raise your hands above your head. Your forearms should be vertical and your upper arms should be horizontal.  Hold for __________ seconds. Slowly ease the tension on the band, as you reverse the directions and return to the starting position. Repeat __________ times. Complete this exercise __________ times per day.  STRENGTH - Scapular Depressors  Find a sturdy chair without wheels, such as a dining room chair.  Keeping your feet on the floor, and your hands on the chair arms, lift your bottom up from the seat, and lock your elbows.  Keeping your elbows straight, allow gravity to pull your body weight down. Your shoulders will rise toward your ears.  Raise your body against gravity by drawing your shoulder blades down your back, shortening the distance between your shoulders and ears. Although your feet should always maintain contact with the floor, your feet should progressively support less body weight, as you get stronger.  Hold for __________ seconds. In a controlled and slow manner, lower your body weight to begin the next repetition. Repeat __________ times. Complete this exercise __________ times per day.  Document Released: 08/04/2005 Document Revised: 10/27/2011 Document Reviewed: 11/16/2008 [facility] Patient Information 2015 [place], [place]. This information is not intended to replace advice given to you by your health care  provider. Make sure you discuss any questions you have with your health care provider.

## 2014-12-08 NOTE — Progress Notes (Signed)
   Subjective:    Patient ID: Grant Wilson, male    DOB: [birth year], 49 y.o.   MRN: [id number]  HPI Here for cpe. GERD is much better on nexium, past w/ups reviewed. GI doctor is Dr. Lenard Simmer, he also treats his Hepatitis B with Viread. He has chronic right shoulder pain, but has full function. Works at [employer].  Review of Systems  HENT: Negative.   Eyes: Negative.   Respiratory: Negative.   Cardiovascular: Negative.   Gastrointestinal: Negative for nausea, vomiting, abdominal pain, diarrhea, constipation, blood in stool, abdominal distention, anal bleeding and rectal pain.  Endocrine: Negative.   Genitourinary: Negative.   Musculoskeletal: Negative.   Skin: Negative.   Allergic/Immunologic: Negative.   Neurological: Negative.   Psychiatric/Behavioral: Negative.        Objective:   Physical Exam  Constitutional: He is oriented to person, place, and time. He appears well-developed and well-nourished. No distress.  HENT:  Head: Normocephalic.  Right Ear: External ear normal.  Left Ear: External ear normal.  Nose: Nose normal.  Mouth/Throat: Oropharynx is clear and moist.  Eyes: Conjunctivae and EOM are normal. Pupils are equal, round, and reactive to light. No scleral icterus.  Neck: Normal range of motion. Neck supple. No tracheal deviation present. No thyromegaly present.  Cardiovascular: Normal rate, regular rhythm, normal heart sounds and intact distal pulses.  Exam reveals no gallop.   No murmur heard. Pulmonary/Chest: Effort normal and breath sounds normal.  Abdominal: Soft. Bowel sounds are normal. He exhibits no distension and no mass. There is no tenderness. There is no rebound and no guarding.  Genitourinary: Rectum normal, prostate normal and penis normal.  Musculoskeletal: Normal range of motion. He exhibits no edema or tenderness.  Lymphadenopathy:    He has no cervical adenopathy.  Neurological: He is alert and oriented to person, place, and time.  No cranial nerve deficit. He exhibits normal muscle tone. Coordination normal.  Skin: No rash noted.  Psychiatric: He has a normal mood and affect. His behavior is normal. Judgment and thought content normal.   EKG normal  Results for orders placed or performed in visit on 12/08/14  POCT urinalysis dipstick  Result Value Ref Range   Color, UA yellow    Clarity, UA clear    Glucose, UA neg    Bilirubin, UA neg    Ketones, UA neg    Spec Grav, UA 1.015    Blood, UA trace-lysed    pH, UA 7.5    Protein, UA neg    Urobilinogen, UA 0.2    Nitrite, UA neg    Leukocytes, UA Negative   POCT UA - Microscopic Only  Result Value Ref Range   WBC, Ur, HPF, POC neg    RBC, urine, microscopic neg    Bacteria, U Microscopic neg    Mucus, UA neg    Epithelial cells, urine per micros 0-1    Crystals, Ur, HPF, POC neg    Casts, Ur, LPF, POC neg    Yeast, UA neg         Assessment & Plan:  Hepatitis B controlled GERD controlled/see Dr. Lenard Simmer if returns Left shoulder pain, chronic mild

## 2014-12-09 ENCOUNTER — Encounter: Payer: Self-pay | Admitting: Radiology

## 2014-12-09 LAB — COMPREHENSIVE METABOLIC PANEL
ALBUMIN: 4.4 g/dL (ref 3.5–5.2)
ALT: 30 U/L (ref 0–53)
AST: 24 U/L (ref 0–37)
Alkaline Phosphatase: 38 U/L — ABNORMAL LOW (ref 39–117)
BUN: 12 mg/dL (ref 6–23)
CALCIUM: 9.7 mg/dL (ref 8.4–10.5)
CO2: 28 meq/L (ref 19–32)
CREATININE: 0.96 mg/dL (ref 0.50–1.35)
Chloride: 100 mEq/L (ref 96–112)
Glucose, Bld: 91 mg/dL (ref 70–99)
Potassium: 4.4 mEq/L (ref 3.5–5.3)
Sodium: 140 mEq/L (ref 135–145)
Total Bilirubin: 0.6 mg/dL (ref 0.2–1.2)
Total Protein: 7.4 g/dL (ref 6.0–8.3)

## 2014-12-09 LAB — PSA: PSA: 0.9 ng/mL (ref <=4.00)

## 2014-12-09 LAB — LIPID PANEL
CHOL/HDL RATIO: 3.9 ratio
CHOLESTEROL: 198 mg/dL (ref 0–200)
HDL: 51 mg/dL (ref >=40)
LDL Cholesterol: 136 mg/dL — ABNORMAL HIGH (ref 0–99)
Triglycerides: 55 mg/dL (ref <150)
VLDL: 11 mg/dL (ref 0–40)

## 2014-12-09 LAB — TSH: TSH: 0.803 u[IU]/mL (ref 0.350–4.500)

## 2014-12-12 ENCOUNTER — Ambulatory Visit (INDEPENDENT_AMBULATORY_CARE_PROVIDER_SITE_OTHER): Payer: PRIVATE HEALTH INSURANCE | Admitting: Family Medicine

## 2014-12-12 ENCOUNTER — Ambulatory Visit (INDEPENDENT_AMBULATORY_CARE_PROVIDER_SITE_OTHER): Payer: PRIVATE HEALTH INSURANCE

## 2014-12-12 VITALS — BP 110/70 | HR 72 | Temp 98.2°F | Ht 68.0 in | Wt 157.1 lb

## 2014-12-12 DIAGNOSIS — S91331A Puncture wound without foreign body, right foot, initial encounter: Secondary | ICD-10-CM

## 2014-12-12 DIAGNOSIS — S90851A Superficial foreign body, right foot, initial encounter: Secondary | ICD-10-CM | POA: Diagnosis not present

## 2014-12-12 DIAGNOSIS — M79671 Pain in right foot: Secondary | ICD-10-CM

## 2014-12-12 MED ORDER — AMOXICILLIN-POT CLAVULANATE 875-125 MG PO TABS: 1.0000 | ORAL_TABLET | Freq: Two times a day (BID) | ORAL | Status: AC

## 2014-12-12 MED ORDER — HYDROCODONE-ACETAMINOPHEN 5-325 MG PO TABS: 1.0000 | ORAL_TABLET | Freq: Four times a day (QID) | ORAL | Status: DC | PRN

## 2014-12-12 NOTE — Progress Notes (Signed)
Patient was examined with Bush, PA-C. There is a little firmness possibly in the area of entry of the foreign body. Curetted the callus off to be certain and there was a foreign body entry hole. Together x-ray was examined. Foreign body can be visualized, suspicious for a fish bone which he thinks would be a possibility that he could've stepped on barefooted on his kitchen floor. Multiple attempts were made to try and grasp the foreign body through the incision that we made. We were unable to succeed. Treatment plan was discussed. Patient will be followed as directed.

## 2014-12-12 NOTE — Patient Instructions (Addendum)
Take antibiotic until finished. You will get a phone call to schedule appointment with ortho - if object works way out before then, you can cancel the appointment. May take norco for pain. Return with further problems/concerns.

## 2014-12-12 NOTE — Progress Notes (Signed)
Subjective:    Patient ID: Grant Wilson, male    DOB: January 19, 1966, 49 y.o.   MRN: [id number]  HPI  This is a 49 year old presenting with complaint of foreign body in right foot x 3 days. Reports he stepped on something in his kitchen 2 days ago. He does not know what he stepped on, although he speculates it may have been a fishbone as he eats a lot of fish. Does not recall breaking glass in his kitchen in the recent past. There was a small amount of blood from the wound. Since then he has had pain with walking which is described as "pinching". He denies fever, chills. Last TDap 4 days ago.  Review of Systems  Constitutional: Negative for fever and chills.  Gastrointestinal: Negative for nausea and vomiting.  Skin: Positive for wound.  Hematological: Negative for adenopathy.    Patient Active Problem List   Diagnosis Date Noted  . Chronic low back pain 07/22/2012  . Elevated cholesterol 05/12/2012  . Seasonal allergies 05/12/2012  . Hepatitis B 09/22/2011   Prior to Admission medications   Medication Sig Start Date End Date Taking? Authorizing Provider  esomeprazole (NEXIUM) 40 MG capsule Take 1 capsule (40 mg total) by mouth daily. 11/28/14  Yes Posey Boyer, MD  tenofovir (VIREAD) 300 MG tablet Take 300 mg by mouth daily.   Yes Historical Provider, MD   No Known Allergies  Patient's social and family history were reviewed.     Objective:   Physical Exam  Constitutional: He is oriented to person, place, and time. He appears well-developed and well-nourished. No distress.  HENT:  Head: Normocephalic and atraumatic.  Right Ear: Hearing normal.  Left Ear: Hearing normal.  Nose: Nose normal.  Eyes: Conjunctivae and lids are normal. Right eye exhibits no discharge. Left eye exhibits no discharge. No scleral icterus.  Pulmonary/Chest: Effort normal. No respiratory distress.  Musculoskeletal: Normal range of motion.  Neurological: He is alert and oriented to person, place,  and time.  Skin: Skin is warm and dry.  Very small, 1 mm, puncture wound on right ball of foot in between great and 2nd toe. No evidence of foreign body. No erythema or signs of infection.  Psychiatric: He has a normal mood and affect. His speech is normal and behavior is normal. Thought content normal.   BP 110/70 mmHg  Pulse 72  Temp(Src) 98.2 F (36.8 C) (Oral)  Ht 5\' 8"  (1.727 m)  Wt 157 lb 2 oz (71.271 kg)  BMI 23.90 kg/m2  SpO2 97%  [facility] reading (PRIMARY) by  Dr. Linna Darner: Linear foreign body at ball of foot between 1st and 2nd digit. Repeat radiograph with marker over incision revealed foreign body at inferior incision.  Procedure: verbal consent obtained. Skin anesthetized with 1-1/2 cc 1% lidocaine with epi. 1 cm incision made through a puncture wound with #10 blade. Wound explored with curved hemostats. Tip of the foreign body felt with hemostats however object unable to be removed. Wound dressed.    Assessment & Plan:  1. Puncture wound of foot, right, initial encounter 2. Foreign body in foot, right, initial encounter Radiograph positive for linear foreign body in the ball of foot. Unable to remove foreign body. Augmentin to prevent infection. Norco for pain. Referred to orthopedic surgery for further evaluation. Patient may cancel appointment if object expels before then.  - DG Foot Complete Right; Future - amoxicillin-clavulanate (AUGMENTIN) 875-125 MG per tablet; Take 1 tablet by mouth 2 (two)  times daily.  Dispense: 20 tablet; Refill: 0 - Ambulatory referral to Orthopedic Surgery - HYDROcodone-acetaminophen (NORCO) 5-325 MG per tablet; Take 1 tablet by mouth every 6 (six) hours as needed.  Dispense: 10 tablet; Refill: 0   Benjaman Pott. Drenda Freeze, MHS [facility]  12/12/2014

## 2015-04-24 ENCOUNTER — Ambulatory Visit (INDEPENDENT_AMBULATORY_CARE_PROVIDER_SITE_OTHER): Payer: PRIVATE HEALTH INSURANCE | Admitting: Physician Assistant

## 2015-04-24 VITALS — BP 126/88 | HR 88 | Temp 98.8°F | Resp 18 | Ht 68.0 in | Wt 161.8 lb

## 2015-04-24 DIAGNOSIS — K219 Gastro-esophageal reflux disease without esophagitis: Secondary | ICD-10-CM | POA: Diagnosis not present

## 2015-04-24 LAB — POCT CBC
Granulocyte percent: 49 %G (ref 37–80)
HCT, POC: 48.5 % (ref 43.5–53.7)
HEMOGLOBIN: 15.2 g/dL (ref 14.1–18.1)
LYMPH, POC: 2.2 (ref 0.6–3.4)
MCH, POC: 29 pg (ref 27–31.2)
MCHC: 31.5 g/dL — AB (ref 31.8–35.4)
MCV: 92.1 fL (ref 80–97)
MID (cbc): 0.4 (ref 0–0.9)
MPV: 6.8 fL (ref 0–99.8)
PLATELET COUNT, POC: 229 10*3/uL (ref 142–424)
POC Granulocyte: 2.5 (ref 2–6.9)
POC LYMPH %: 43.1 % (ref 10–50)
POC MID %: 7.9 %M (ref 0–12)
RBC: 5.26 M/uL (ref 4.69–6.13)
RDW, POC: 13.7 %
WBC: 5.2 10*3/uL (ref 4.6–10.2)

## 2015-04-24 MED ORDER — RANITIDINE HCL 150 MG PO TABS: 150.0000 mg | ORAL_TABLET | Freq: Two times a day (BID) | ORAL | Status: DC | PRN

## 2015-04-24 MED ORDER — ESOMEPRAZOLE MAGNESIUM 40 MG PO CPDR: 40.0000 mg | DELAYED_RELEASE_CAPSULE | Freq: Every day | ORAL | Status: DC

## 2015-04-24 NOTE — Progress Notes (Signed)
04/24/2015 at 8:34 PM  [place] / DOB: Oct 05, 1965 / MRN: [id number]  The patient has Hepatitis B; Elevated cholesterol; Seasonal allergies; and Chronic low back pain on his problem list.  SUBJECTIVE  Grant Wilson is a 49 y.o. well appearing male presenting for the chief complaint of GERD over the last year that he describes as burning with burping. Reports he has had this problem in the past and was placed on Nexium 40 mg qd and this helped, although it did not eliminate his symptoms.  He has a gastroenterologist at [facility], Dr. Amedeo Plenty, who treats him for Hep B with Viread.  Dr. Amedeo Plenty has also ruled out H. Pylori as a possible diagnosis.  Patient is a never smoker, denies dysphagia, odynophagia, melena, weight loss, and hematochezia.  He is approaching 49 years of age and did not have this problem before the last year.  He denies belly pain.    He  has a past medical history of Hepatitis B; GERD (gastroesophageal reflux disease); Allergy; and Hyperlipidemia.    Medications reviewed and updated by myself where necessary, and exist elsewhere in the encounter.   Grant Wilson has No Known Allergies. He  reports that he has never smoked. He has never used smokeless tobacco. He reports that he does not drink alcohol or use illicit drugs. He  has no sexual activity history on file. The patient  has no past surgical history on file.  His family history is not on file.  Review of Systems  Constitutional: Negative for fever and chills.  Respiratory: Negative for shortness of breath.   Cardiovascular: Negative for chest pain.  Gastrointestinal: Positive for heartburn. Negative for nausea, vomiting and abdominal pain.  Genitourinary: Negative.   Skin: Negative for rash.  Neurological: Negative for dizziness and headaches.    OBJECTIVE  His  height is 5\' 8"  (1.727 m) and weight is 161 lb 12.8 oz (73.392 kg). His oral temperature is 98.8 F (37.1 C). His blood pressure is 126/88 and his pulse is  88. His respiration is 18 and oxygen saturation is 98%.  The patient's body mass index is 24.61 kg/(m^2).  Physical Exam  Vitals reviewed. Constitutional: He is oriented to person, place, and time. He appears well-developed. No distress.  Eyes: EOM are normal. Pupils are equal, round, and reactive to light. No scleral icterus.  Neck: Normal range of motion.  Cardiovascular: Normal rate and regular rhythm.   Respiratory: Effort normal and breath sounds normal.  GI: Soft. Bowel sounds are normal. He exhibits no distension and no mass. There is no tenderness. There is no rebound and no guarding.  Musculoskeletal: Normal range of motion.  Neurological: He is alert and oriented to person, place, and time. No cranial nerve deficit.  Skin: Skin is warm and dry. No rash noted. He is not diaphoretic.  Psychiatric: He has a normal mood and affect.    Vitals - 1 value per visit 04/24/2015 12/12/2014 12/08/2014  Weight (lb) 161.8 157.13 155   No results found for this or any previous visit (from the past 24 hour(s)).  ASSESSMENT & PLAN  Grant Wilson was seen today for shortness of breath.  Diagnoses and all orders for this visit:  Gastroesophageal reflux disease, esophagitis presence not specified: Patient with new onset GERD over the last year and ruled out for H. Pylori.  Will send to GI in the hope that he may receive a scope.  Will treat symptomatically for now.   -  POCT CBC -     Ambulatory referral to Gastroenterology    The patient was advised to call or come back to clinic if he does not see an improvement in symptoms, or worsens with the above plan.   Philis Fendt, MHS, PA-C [facility] 04/24/2015 8:34 PM

## 2015-09-26 ENCOUNTER — Other Ambulatory Visit: Payer: Self-pay | Admitting: Gastroenterology

## 2015-09-26 DIAGNOSIS — R1013 Epigastric pain: Secondary | ICD-10-CM

## 2015-10-01 ENCOUNTER — Ambulatory Visit
Admission: RE | Admit: 2015-10-01 | Discharge: 2015-10-01 | Disposition: A | Discharge status: Home / Self Care | Payer: PRIVATE HEALTH INSURANCE | Source: Ambulatory Visit | Attending: Gastroenterology | Admitting: Gastroenterology

## 2015-10-01 DIAGNOSIS — R1013 Epigastric pain: Secondary | ICD-10-CM

## 2015-10-04 ENCOUNTER — Other Ambulatory Visit: Payer: PRIVATE HEALTH INSURANCE

## 2015-11-04 ENCOUNTER — Ambulatory Visit (INDEPENDENT_AMBULATORY_CARE_PROVIDER_SITE_OTHER): Payer: PRIVATE HEALTH INSURANCE | Admitting: Physician Assistant

## 2015-11-04 VITALS — BP 134/76 | HR 107 | Temp 98.9°F | Resp 12 | Ht 68.0 in | Wt 172.0 lb

## 2015-11-04 DIAGNOSIS — R058 Other specified cough: Secondary | ICD-10-CM

## 2015-11-04 DIAGNOSIS — R69 Illness, unspecified: Secondary | ICD-10-CM

## 2015-11-04 DIAGNOSIS — R05 Cough: Secondary | ICD-10-CM | POA: Diagnosis not present

## 2015-11-04 DIAGNOSIS — K920 Hematemesis: Secondary | ICD-10-CM | POA: Diagnosis not present

## 2015-11-04 DIAGNOSIS — J111 Influenza due to unidentified influenza virus with other respiratory manifestations: Secondary | ICD-10-CM

## 2015-11-04 LAB — POCT CBC
Granulocyte percent: 76.2 %G (ref 37–80)
HCT, POC: 43.8 % (ref 43.5–53.7)
Hemoglobin: 15.6 g/dL (ref 14.1–18.1)
Lymph, poc: 0.9 (ref 0.6–3.4)
MCH, POC: 32.4 pg — AB (ref 27–31.2)
MCHC: 35.5 g/dL — AB (ref 31.8–35.4)
MCV: 91.1 fL (ref 80–97)
MID (cbc): 0.4 (ref 0–0.9)
MPV: 7.1 fL (ref 0–99.8)
PLATELET COUNT, POC: 154 10*3/uL (ref 142–424)
POC Granulocyte: 4.2 (ref 2–6.9)
POC LYMPH %: 16.8 % (ref 10–50)
POC MID %: 7 %M (ref 0–12)
RBC: 4.81 M/uL (ref 4.69–6.13)
RDW, POC: 13.7 %
WBC: 5.5 10*3/uL (ref 4.6–10.2)

## 2015-11-04 MED ORDER — ACETAMINOPHEN 325 MG PO TABS: 650.0000 mg | ORAL_TABLET | Freq: Four times a day (QID) | ORAL | Status: DC | PRN

## 2015-11-04 MED ORDER — OSELTAMIVIR PHOSPHATE 75 MG PO CAPS: 75.0000 mg | ORAL_CAPSULE | Freq: Two times a day (BID) | ORAL | Status: DC

## 2015-11-04 NOTE — Progress Notes (Signed)
11/04/2015 12:58 PM   DOB: Jul 17, 1966 / MRN: [id number]  SUBJECTIVE:  Grant Wilson is a 50 y.o. male presenting for cough productive of blood streaked sputum, sore throat and headache that started 2 days ago.  He associates low grade fever a pleuritic pain with cough. He has had the flu shot.  Has trid OTC cold prep without relief. Denies a history of asthma, diabetes and is a never smoker.     He has No Known Allergies.   He  has a past medical history of Hepatitis B; GERD (gastroesophageal reflux disease); Allergy; and Hyperlipidemia.    He  reports that he has never smoked. He has never used smokeless tobacco. He reports that he does not drink alcohol or use illicit drugs. He  has no sexual activity history on file. The patient  has no past surgical history on file.  His family history is not on file.  Review of Systems  Constitutional: Negative for fever.  Respiratory: Positive for hemoptysis and sputum production. Negative for shortness of breath and wheezing.   Cardiovascular: Negative for chest pain.  Gastrointestinal: Negative for nausea.  Genitourinary: Negative for dysuria.  Neurological: Negative for dizziness.    Problem list and medications reviewed and updated by myself where necessary, and exist elsewhere in the encounter.   OBJECTIVE:  BP 134/76 mmHg  Pulse 107  Temp(Src) 98.9 F (37.2 C) (Oral)  Resp 12  Ht 5\' 8"  (1.727 m)  Wt 172 lb (78.019 kg)  BMI 26.16 kg/m2  SpO2 96%  Physical Exam  Constitutional: He is oriented to person, place, and time. He appears well-developed. He does not appear ill.  Eyes: Conjunctivae and EOM are normal. Pupils are equal, round, and reactive to light.  Cardiovascular: Regular rhythm, normal heart sounds and intact distal pulses.  Exam reveals no gallop and no friction rub.   No murmur heard. Pulmonary/Chest: Effort normal and breath sounds normal. He has no wheezes. He has no rales.  Abdominal: Soft. Bowel sounds are  normal. He exhibits no distension. There is no tenderness.  Musculoskeletal: Normal range of motion.  Neurological: He is alert and oriented to person, place, and time. No cranial nerve deficit. Coordination normal.  Skin: Skin is warm and dry. He is not diaphoretic.  Psychiatric: He has a normal mood and affect.  Nursing note and vitals reviewed.   Results for orders placed or performed in visit on 11/04/15 (from the past 72 hour(s))  POCT CBC     Status: Abnormal   Collection Time: 11/04/15 12:42 PM  Result Value Ref Range   WBC 5.5 4.6 - 10.2 K/uL   Lymph, poc 0.9 0.6 - 3.4   POC LYMPH PERCENT 16.8 10 - 50 %L   MID (cbc) 0.4 0 - 0.9   POC MID % 7.0 0 - 12 %M   POC Granulocyte 4.2 2 - 6.9   Granulocyte percent 76.2 37 - 80 %G   RBC 4.81 4.69 - 6.13 M/uL   Hemoglobin 15.6 14.1 - 18.1 g/dL   HCT, POC 43.8 43.5 - 53.7 %   MCV 91.1 80 - 97 fL   MCH, POC 32.4 (A) 27 - 31.2 pg   MCHC 35.5 (A) 31.8 - 35.4 g/dL   RDW, POC 13.7 %   Platelet Count, POC 154 142 - 424 K/uL   MPV 7.1 0 - 99.8 fL    No results found.  ASSESSMENT AND PLAN  Najae was seen today for cough, sore throat  and headache.  Diagnoses and all orders for this visit:  Productive cough: His lung exam and CBC are reassuring and his HPI is consistent with problem two. Will treat for that.  RTC in 24 to 48 hours if symptoms not improving.  -     POCT CBC  Influenza-like illness -     oseltamivir (TAMIFLU) 75 MG capsule; Take 1 capsule (75 mg total) by mouth 2 (two) times daily. -     acetaminophen (TYLENOL) 325 MG tablet; Take 2 tablets (650 mg total) by mouth every 6 (six) hours as needed for fever.    The patient was advised to call or return to clinic if he does not see an improvement in symptoms or to seek the care of the closest emergency department if he worsens with the above plan.   Philis Fendt, MHS, PA-C [facility] 11/04/2015 12:58 PM

## 2015-11-04 NOTE — Patient Instructions (Signed)
     IF you received an x-ray today, you will receive an invoice from [facility]. Please contact [facility] at [phone] with questions or concerns regarding your invoice.   IF you received labwork today, you will receive an invoice from [facility]. Please contact [facility] at [phone] with questions or concerns regarding your invoice.   Our billing staff will not be able to assist you with questions regarding bills from these companies.  You will be contacted with the lab results as soon as they are available. The fastest way to get your results is to activate your My Chart account. Instructions are located on the last page of this paperwork. If you have not heard from us regarding the results in 2 weeks, please contact this office.      

## 2016-01-11 ENCOUNTER — Ambulatory Visit (INDEPENDENT_AMBULATORY_CARE_PROVIDER_SITE_OTHER): Payer: PRIVATE HEALTH INSURANCE

## 2016-01-11 ENCOUNTER — Ambulatory Visit (INDEPENDENT_AMBULATORY_CARE_PROVIDER_SITE_OTHER): Payer: PRIVATE HEALTH INSURANCE | Admitting: Physician Assistant

## 2016-01-11 VITALS — BP 118/80 | HR 88 | Temp 98.2°F | Resp 18 | Ht 68.0 in | Wt 163.4 lb

## 2016-01-11 DIAGNOSIS — J302 Other seasonal allergic rhinitis: Secondary | ICD-10-CM | POA: Diagnosis not present

## 2016-01-11 DIAGNOSIS — M545 Low back pain, unspecified: Secondary | ICD-10-CM

## 2016-01-11 DIAGNOSIS — M5136 Other intervertebral disc degeneration, lumbar region: Secondary | ICD-10-CM

## 2016-01-11 MED ORDER — FLUTICASONE PROPIONATE 50 MCG/ACT NA SUSP: 2.0000 | Freq: Every day | NASAL | Status: DC

## 2016-01-11 NOTE — Progress Notes (Signed)
Patient ID: Grant Wilson, male    DOB: 10/15/1965, 50 y.o.   MRN: [id number]  PCP: Kennon Portela, MD  Subjective:   Chief Complaint  Patient presents with  . Allergies    eyes and throat have been itching for the past month. No SOB.   . Back Pain    lower back is stiff in the mornings. x1+ month     HPI Presents for evaluation of allergies and back pain.  1. When he gets up in the morning, he feels stiff in the low back. Once he gets moving for about an hour, it feels normal. This occurs most mornings, but not all. Sometimes when he stretches, he has pain into the buttock on the LEFT. No numbness or tingling in the leg or foot. No weakness, falls, saddle anesthesia.  2. Intermittent allergy symptoms x 6 weeks. Eyes and throat are itchy, especially if he stays outside very long. Sometimes sees a little blood from his throat when spitting after brushing his teeth in the mornings. Some nasal congestion, occasional itching in the ears. No cough. No HA, dizziness, vision changes. No fever/chills.   Review of Systems As above.    Patient Active Problem List   Diagnosis Date Noted  . Chronic low back pain 07/22/2012  . Elevated cholesterol 05/12/2012  . Seasonal allergies 05/12/2012  . Hepatitis B 09/22/2011     Prior to Admission medications   Medication Sig Start Date End Date Taking? Authorizing Provider  acetaminophen (TYLENOL) 325 MG tablet Take 2 tablets (650 mg total) by mouth every 6 (six) hours as needed for fever. 11/04/15  Yes Tereasa Coop, PA-C  tenofovir (VIREAD) 300 MG tablet Take 300 mg by mouth daily. Reported on 01/11/2016   Yes Historical Provider, MD     No Known Allergies     Objective:  Physical Exam  Constitutional: He is oriented to person, place, and time. He appears well-developed and well-nourished. He is active and cooperative. No distress.  BP 118/80 mmHg  Pulse 88  Temp(Src) 98.2 F (36.8 C) (Oral)  Resp 18  Ht 5\' 8"  (1.727 m)   Wt 163 lb 6.4 oz (74.118 kg)  BMI 24.85 kg/m2  SpO2 98%  HENT:  Head: Normocephalic and atraumatic.  Right Ear: Hearing, tympanic membrane, external ear and ear canal normal.  Left Ear: Hearing, tympanic membrane, external ear and ear canal normal.  Nose: Nose normal.  Mouth/Throat: Uvula is midline, oropharynx is clear and moist and mucous membranes are normal. No oral lesions. No uvula swelling.  Eyes: Conjunctivae, EOM and lids are normal. Pupils are equal, round, and reactive to light. No scleral icterus.  Neck: Normal range of motion, full passive range of motion without pain and phonation normal. Neck supple. No thyromegaly present.  Cardiovascular: Normal rate, regular rhythm and normal heart sounds.   Pulses:      Radial pulses are 2+ on the right side, and 2+ on the left side.  Pulmonary/Chest: Effort normal and breath sounds normal.  Musculoskeletal:       Right hip: Normal.       Left hip: Normal.       Cervical back: Normal.       Thoracic back: Normal.       Lumbar back: Normal.  Lymphadenopathy:       Head (right side): No tonsillar, no preauricular, no posterior auricular and no occipital adenopathy present.       Head (left side): No tonsillar,  no preauricular, no posterior auricular and no occipital adenopathy present.    He has no cervical adenopathy.       Right: No supraclavicular adenopathy present.       Left: No supraclavicular adenopathy present.  Neurological: He is alert and oriented to person, place, and time. He has normal strength. No sensory deficit.  Reflex Scores:      Patellar reflexes are 2+ on the right side and 2+ on the left side.      Achilles reflexes are 2+ on the right side and 2+ on the left side. Skin: Skin is warm, dry and intact. No rash noted. No cyanosis or erythema. Nails show no clubbing.  Psychiatric: He has a normal mood and affect. His speech is normal and behavior is normal.       Dg Lumbar Spine Complete  01/11/2016   CLINICAL DATA:  Bilateral low back pain. EXAM: LUMBAR SPINE - COMPLETE 4+ VIEW COMPARISON:  11/20/2011 FINDINGS: Mild degenerative spurring throughout the lumbar spine anteriorly. Disc spaces are maintained. Normal alignment. No fracture. SI joints are symmetric and unremarkable. IMPRESSION: No acute findings. Electronically Signed   By: Rolm Baptise M.D.   On: 01/11/2016 13:51       Assessment & Plan:   1. Seasonal allergies He may also use OTC oral antihistamine PRN. - fluticasone (FLONASE) 50 MCG/ACT nasal spray; Place 2 sprays into both nostrils daily.  Dispense: 16 g; Refill: 12  2. Bilateral low back pain without sciatica 3. DDD (degenerative disc disease), lumbar Discussed radiologic findings. He is not interested in daily NSAIDS at this point, and will continue with stretching and staying active. - DG Lumbar Spine Complete; Future     Fara Chute, PA-C Physician Assistant-Certified [facility]

## 2016-01-11 NOTE — Patient Instructions (Addendum)
There is some arthritis in your low back, and that likely contributes to the stiffness in your back in the mornings. At this point, I recommend staying active, and stretching in the mornings rather than taking medication. If you do have pain, you can use acetaminophen or ibuprofen.  For the allergies, I recommend OTC Claritin (loratadine) 10 mg each day and the Flonase nasal spray (sent to the pharmacy) 2 sprays in each nostril one time each day.    IF you received an x-ray today, you will receive an invoice from [facility]. Please contact [facility] at [phone] with questions or concerns regarding your invoice.   IF you received labwork today, you will receive an invoice from [employer]. Please contact [facility] at [phone] with questions or concerns regarding your invoice.   Our billing staff will not be able to assist you with questions regarding bills from these companies.  You will be contacted with the lab results as soon as they are available. The fastest way to get your results is to activate your My Chart account. Instructions are located on the last page of this paperwork. If you have not heard from [place] regarding the results in 2 weeks, please contact this office.

## 2016-02-29 ENCOUNTER — Ambulatory Visit (INDEPENDENT_AMBULATORY_CARE_PROVIDER_SITE_OTHER): Payer: PRIVATE HEALTH INSURANCE | Admitting: Physician Assistant

## 2016-02-29 VITALS — BP 120/78 | HR 81 | Temp 97.9°F | Resp 18 | Ht 68.0 in | Wt 164.0 lb

## 2016-02-29 DIAGNOSIS — M479 Spondylosis, unspecified: Secondary | ICD-10-CM

## 2016-02-29 DIAGNOSIS — M47816 Spondylosis without myelopathy or radiculopathy, lumbar region: Secondary | ICD-10-CM

## 2016-02-29 NOTE — Progress Notes (Signed)
   02/29/2016 2:27 PM   DOB: [birth year] / MRN: [id number]  SUBJECTIVE:  Grant Wilson is a 50 y.o. male presenting for the evaluation of stable "achy" mid line low back back pain that started 4 months ago. Associated symptoms include no other symptoms, and he denies weakness, numbness, tingling, dysuria, leg pain, leg weakness, incontinence.Treatments tried thus far include stretching. He denies fever, nausea, dysuria, frequency and urgency.   He has No Known Allergies.   He  has a past medical history of Hepatitis B; GERD (gastroesophageal reflux disease); Allergy; and Hyperlipidemia.    He  reports that he has never smoked. He has never used smokeless tobacco. He reports that he does not drink alcohol or use illicit drugs. He  has no sexual activity history on file. The patient  has past surgical history that includes No past surgeries.  His family history is not on file.  Review of Systems  Constitutional: Negative for fever.  Gastrointestinal: Negative for nausea.  Genitourinary: Negative for dysuria, urgency and frequency.  Neurological: Negative for dizziness and headaches.    Problem list and medications reviewed and updated by myself where necessary, and exist elsewhere in the encounter.   OBJECTIVE:  BP 120/78 mmHg  Pulse 81  Temp(Src) 97.9 F (36.6 C) (Oral)  Resp 18  Ht 5\' 8"  (1.727 m)  Wt 164 lb (74.39 kg)  BMI 24.94 kg/m2  SpO2 98%   Physical Exam  Constitutional: He is oriented to person, place, and time. He appears well-developed. He does not appear ill.  Eyes: Conjunctivae and EOM are normal. Pupils are equal, round, and reactive to light.  Cardiovascular: Normal rate.   Pulmonary/Chest: Effort normal.  Abdominal: He exhibits no distension.  Musculoskeletal: Normal range of motion.  Neurological: He is alert and oriented to person, place, and time. He has normal strength and normal reflexes. He displays no atrophy and no tremor. No cranial nerve deficit or  sensory deficit. He exhibits normal muscle tone. He displays a negative Romberg sign. He displays no seizure activity. Coordination and gait normal. GCS eye subscore is 4. GCS verbal subscore is 5. GCS motor subscore is 6.  Skin: Skin is warm and dry. He is not diaphoretic.  Psychiatric: He has a normal mood and affect.  Nursing note and vitals reviewed.   Lab Results  Component Value Date   ALT 30 12/08/2014   AST 24 12/08/2014   ALKPHOS 38* 12/08/2014   BILITOT 0.6 12/08/2014     No results found for this or any previous visit (from the past 48 hour(s)).  No results found.  ASSESSMENT AND PLANf  Grant Wilson was seen today for back pain.  Diagnoses and all orders for this visit:  Arthritis, lumbar spine: Exam intact.  Rads reviewed and showing he has some arthritis.  Advised he continue a physically active lifestyle, avoid heavy lifting and take tylenol otc as needed.     The patient was advised to call or return to clinic if he does not see an improvement in symptoms or to seek the care of the closest emergency department if he worsens with the above plan.   Philis Fendt, MHS, PA-C [facility] 02/29/2016 2:27 PM

## 2016-02-29 NOTE — Patient Instructions (Addendum)
Take Tylenol over the counter as needed exactly how it is written on the bottle. Continue exercising.     IF you received an x-ray today, you will receive an invoice from [facility]. Please contact [facility] at [phone] with questions or concerns regarding your invoice.   IF you received labwork today, you will receive an invoice from [employer]. Please contact [facility] at [phone] with questions or concerns regarding your invoice.   Our billing staff will not be able to assist you with questions regarding bills from these companies.  You will be contacted with the lab results as soon as they are available. The fastest way to get your results is to activate your My Chart account. Instructions are located on the last page of this paperwork. If you have not heard from [place] regarding the results in 2 weeks, please contact this office.

## 2016-05-24 ENCOUNTER — Ambulatory Visit (INDEPENDENT_AMBULATORY_CARE_PROVIDER_SITE_OTHER): Payer: PRIVATE HEALTH INSURANCE | Admitting: Physician Assistant

## 2016-05-24 VITALS — BP 120/72 | HR 79 | Temp 98.2°F | Resp 16 | Ht 66.0 in | Wt 158.4 lb

## 2016-05-24 DIAGNOSIS — Z114 Encounter for screening for human immunodeficiency virus [HIV]: Secondary | ICD-10-CM | POA: Diagnosis not present

## 2016-05-24 DIAGNOSIS — H539 Unspecified visual disturbance: Secondary | ICD-10-CM | POA: Diagnosis not present

## 2016-05-24 DIAGNOSIS — Z131 Encounter for screening for diabetes mellitus: Secondary | ICD-10-CM

## 2016-05-24 DIAGNOSIS — Z1159 Encounter for screening for other viral diseases: Secondary | ICD-10-CM

## 2016-05-24 DIAGNOSIS — E7800 Pure hypercholesterolemia, unspecified: Secondary | ICD-10-CM

## 2016-05-24 DIAGNOSIS — Z130 Encounter for screening for diseases of the blood and blood-forming organs and certain disorders involving the immune mechanism: Secondary | ICD-10-CM

## 2016-05-24 DIAGNOSIS — Z113 Encounter for screening for infections with a predominantly sexual mode of transmission: Secondary | ICD-10-CM

## 2016-05-24 DIAGNOSIS — Z0000 Encounter for general adult medical examination without abnormal findings: Secondary | ICD-10-CM | POA: Diagnosis not present

## 2016-05-24 DIAGNOSIS — Z23 Encounter for immunization: Secondary | ICD-10-CM

## 2016-05-24 LAB — CBC WITH DIFFERENTIAL/PLATELET
Basophils Absolute: 28 cells/uL (ref 0–200)
Basophils Relative: 1 %
EOS PCT: 2 %
Eosinophils Absolute: 56 cells/uL (ref 15–500)
HCT: 46.5 % (ref 38.5–50.0)
Hemoglobin: 15.9 g/dL (ref 13.2–17.1)
LYMPHS ABS: 1316 {cells}/uL (ref 850–3900)
Lymphocytes Relative: 47 %
MCH: 31.3 pg (ref 27.0–33.0)
MCHC: 34.2 g/dL (ref 32.0–36.0)
MCV: 91.5 fL (ref 80.0–100.0)
MPV: 9.4 fL (ref 7.5–12.5)
Monocytes Absolute: 364 cells/uL (ref 200–950)
Monocytes Relative: 13 %
Neutro Abs: 1036 cells/uL — ABNORMAL LOW (ref 1500–7800)
Neutrophils Relative %: 37 %
PLATELETS: 215 10*3/uL (ref 140–400)
RBC: 5.08 MIL/uL (ref 4.20–5.80)
RDW: 13.1 % (ref 11.0–15.0)
WBC: 2.8 10*3/uL — AB (ref 3.8–10.8)

## 2016-05-24 LAB — HIV ANTIBODY (ROUTINE TESTING W REFLEX): HIV 1&2 Ab, 4th Generation: NONREACTIVE

## 2016-05-24 LAB — LIPID PANEL
CHOLESTEROL: 219 mg/dL — AB (ref 125–200)
HDL: 49 mg/dL (ref >=40)
LDL Cholesterol: 162 mg/dL — ABNORMAL HIGH (ref <130)
Total CHOL/HDL Ratio: 4.5 Ratio (ref <=5.0)
Triglycerides: 41 mg/dL (ref <150)
VLDL: 8 mg/dL (ref <30)

## 2016-05-24 LAB — HEMOGLOBIN A1C
HEMOGLOBIN A1C: 5.7 % — AB (ref <5.7)
MEAN PLASMA GLUCOSE: 117 mg/dL

## 2016-05-24 LAB — HEPATITIS C ANTIBODY: HCV AB: NEGATIVE

## 2016-05-24 NOTE — Patient Instructions (Addendum)
Keeping you healthy  Get these tests  Blood pressure- Have your blood pressure checked once a year by your healthcare provider.  Normal blood pressure is 120/80  Weight- Have your body mass index (BMI) calculated to screen for obesity.  BMI is a measure of body fat based on height and weight. You can also calculate your own BMI at [email or web address].[email or web address]/[email or web address]/.  Cholesterol- Have your cholesterol checked every year.  Diabetes- Have your blood sugar checked regularly if you have high blood pressure, high cholesterol, have a family history of diabetes or if you are overweight.  Screening for Colon Cancer- Colonoscopy starting at age 50.  Screening may begin sooner depending on your family history and other health conditions. Follow up colonoscopy as directed by your Gastroenterologist.  Screening for Prostate Cancer- Both blood work (PSA) and a rectal exam help screen for Prostate Cancer.  Screening begins at age 40 with African-American men and at age 50 with Caucasian men.  Screening may begin sooner depending on your family history.  Take these medicines  Aspirin- One aspirin daily can help prevent Heart disease and Stroke.  Flu shot- Every fall.  Tetanus- Every 10 years.  Zostavax- Once after the age of 60 to prevent Shingles.  Pneumonia shot- Once after the age of 65; if you are younger than 65, ask your healthcare provider if you need a Pneumonia shot.  Take these steps  Don't smoke- If you do smoke, talk to your doctor about quitting.  For tips on how to quit, go to www.smokefree.gov or call 1-800-QUIT-NOW.  Be physically active- Exercise 5 days a week for at least 30 minutes.  If you are not already physically active start slow and gradually work up to 30 minutes of moderate physical activity.  Examples of moderate activity include walking briskly, mowing the yard, dancing, swimming, bicycling, etc.  Eat a healthy diet- Eat a variety of healthy food such as fruits, vegetables, low  fat milk, low fat cheese, yogurt, lean meant, poultry, fish, beans, tofu, etc. For more information go to www.thenutritionsource.org  Drink alcohol in moderation- Limit alcohol intake to less than two drinks a day. Never drink and drive.  Dentist- Brush and floss twice daily; visit your dentist twice a year.  Depression- Your emotional health is as important as your physical health. If you're feeling down, or losing interest in things you would normally enjoy please talk to your healthcare provider.  Eye exam- Visit your eye doctor every year.  Safe sex- If you may be exposed to a sexually transmitted infection, use a condom.  Seat belts- Seat belts can save your life; always wear one.  Smoke/Carbon Monoxide detectors- These detectors need to be installed on the appropriate level of your home.  Replace batteries at least once a year.  Skin cancer- When out in the sun, cover up and use sunscreen 15 SPF or higher.  Violence- If anyone is threatening you, please tell your healthcare provider.  Living Will/ Health care power of attorney- Speak with your healthcare provider and family.    IF you received an x-ray today, you will receive an invoice from [facility]. Please contact [facility] at [phone] with questions or concerns regarding your invoice.   IF you received labwork today, you will receive an invoice from [facility]. Please contact [facility] at [phone] with questions or concerns regarding your invoice.   Our billing staff will not be able to assist you with questions regarding bills from these companies.    You will be contacted with the lab results as soon as they are available. The fastest way to get your results is to activate your My Chart account. Instructions are located on the last page of this paperwork. If you have not heard from us regarding the results in 2 weeks, please contact this office.      

## 2016-05-24 NOTE — Progress Notes (Signed)
Grant Wilson  MRN: [id number] DOB: [birth year]  Subjective:  Pt presents to clinic for a CPE.  He is having no problems  Last dental exam: within the last year Last vision exam: 2 weeks ago - thinking about possible surgery on his left eye for what sounds like floaters  Last colonoscopy: he thinks about 5 years ago with dr Amedeo Plenty Vaccinations - UTD  Exercise - currently none due to his eye - before his eye was causing his problems he would exercise almost daily Diet - eats 3 meals a day, will sometimes eat peanuts for snacks   Patient Active Problem List   Diagnosis Date Noted  . DDD (degenerative disc disease), lumbar 01/11/2016  . Chronic low back pain 07/22/2012  . Elevated cholesterol 05/12/2012  . Seasonal allergies 05/12/2012  . Hepatitis B 09/22/2011    Current Outpatient Prescriptions on File Prior to Visit  Medication Sig Dispense Refill  . fluticasone (FLONASE) 50 MCG/ACT nasal spray Place 2 sprays into both nostrils daily. 16 g 12  . tenofovir (VIREAD) 300 MG tablet Take 300 mg by mouth daily. Reported on 01/11/2016     No current facility-administered medications on file prior to visit.     No Known Allergies  Social History   Social History  . Marital status: Single    Spouse name: n/a  . Number of children: 0  . Years of education: associates   Occupational History  . IT department     [employer]   Social History Main Topics  . Smoking status: Never Smoker  . Smokeless tobacco: Never Used  . Alcohol use No  . Drug use: No  . Sexual activity: Yes    Partners: Female    Birth control/ protection: Condom   Other Topics Concern  . None   Social History Narrative   From [place]. Came to the [place] in 1997.   Lives alone.   No children    Past Surgical History:  Procedure Laterality Date  . NO PAST SURGERIES      History reviewed. No pertinent family history.  Review of Systems  Constitutional: Negative.   HENT: Negative.   Eyes:  Positive for visual disturbance (left eye - seeing an ophthalmologist).  Respiratory: Negative.   Cardiovascular: Negative.   Gastrointestinal: Negative.   Endocrine: Negative.   Genitourinary: Negative.   Musculoskeletal: Negative.   Skin: Negative.   Allergic/Immunologic: Negative.   Neurological: Negative.   Hematological: Negative.   Psychiatric/Behavioral: Negative.    Objective:  BP 120/72   Pulse 79   Temp 98.2 F (36.8 C) (Oral)   Resp 16   Ht 5\' 6"  (1.676 m)   Wt 158 lb 6.4 oz (71.8 kg)   SpO2 98%   BMI 25.57 kg/m   Physical Exam  Constitutional: He is oriented to person, place, and time and well-developed, well-nourished, and in no distress.  HENT:  Head: Normocephalic and atraumatic.  Right Ear: Hearing, tympanic membrane, external ear and ear canal normal.  Left Ear: Hearing, tympanic membrane, external ear and ear canal normal.  Nose: Nose normal.  Mouth/Throat: Uvula is midline, oropharynx is clear and moist and mucous membranes are normal.  Eyes: Conjunctivae and EOM are normal. Pupils are equal, round, and reactive to light.  Neck: Trachea normal and normal range of motion. Neck supple. No thyroid mass and no thyromegaly present.  Cardiovascular: Normal rate, regular rhythm and normal heart sounds.   No murmur heard. Pulmonary/Chest: Effort normal and breath sounds  normal.  Abdominal: Soft. Bowel sounds are normal.  Musculoskeletal: Normal range of motion.  Neurological: He is alert and oriented to person, place, and time. Gait normal.  Skin: Skin is warm and dry.  Psychiatric: Mood, memory, affect and judgment normal.    Visual Acuity Screening   Right eye Left eye Both eyes  Without correction: 20/40 20/100 20/25  With correction:       Assessment and Plan :  Annual physical exam -  Pt needs a form filled out for work - I will fill out and fax in for him because that is what his company wants  Elevated cholesterol - Plan: Lipid panel  Screening  for diabetes mellitus (DM) - Plan: Hemoglobin A1c  Screening for deficiency anemia - Plan: CBC with Differential/Platelet  Screen for STD (sexually transmitted disease) - Plan: RPR, Trichomonas vaginalis RNA, Ql,Males, GC/Chlamydia Probe Amp  Need for hepatitis C screening test - Plan: Hepatitis C antibody  Screening for HIV (human immunodeficiency virus) - Plan: HIV antibody  Flu vaccine need - Plan: Flu Vaccine QUAD 36+ mos IM  Vision disturbance - continue f/u with ophthalmology  Windell Hummingbird PA-C  [facility] 05/24/2016 11:06 AM

## 2016-05-27 LAB — TRICHOMONAS VAGINALIS RNA, QL,MALES: TRICHOMONAS VAGINALIS RNA: NOT DETECTED

## 2016-05-27 LAB — GC/CHLAMYDIA PROBE AMP
CT Probe RNA: NOT DETECTED
GC PROBE AMP APTIMA: NOT DETECTED

## 2016-05-27 LAB — RPR

## 2016-09-30 ENCOUNTER — Other Ambulatory Visit: Payer: Self-pay | Admitting: Gastroenterology

## 2016-09-30 DIAGNOSIS — B181 Chronic viral hepatitis B without delta-agent: Secondary | ICD-10-CM

## 2016-10-09 ENCOUNTER — Ambulatory Visit: Payer: Self-pay | Admitting: Registered Nurse

## 2016-10-09 VITALS — BP 114/85 | HR 116 | Temp 98.6°F

## 2016-10-09 DIAGNOSIS — H6592 Unspecified nonsuppurative otitis media, left ear: Secondary | ICD-10-CM

## 2016-10-09 DIAGNOSIS — J209 Acute bronchitis, unspecified: Secondary | ICD-10-CM

## 2016-10-09 DIAGNOSIS — J301 Allergic rhinitis due to pollen: Secondary | ICD-10-CM

## 2016-10-09 MED ORDER — ALBUTEROL SULFATE HFA 108 (90 BASE) MCG/ACT IN AERS: 1.0000 | INHALATION_SPRAY | RESPIRATORY_TRACT | 0 refills | Status: DC | PRN

## 2016-10-09 MED ORDER — BENZONATATE 200 MG PO CAPS: 200.0000 mg | ORAL_CAPSULE | Freq: Two times a day (BID) | ORAL | 0 refills | Status: DC | PRN

## 2016-10-09 MED ORDER — AMOXICILLIN-POT CLAVULANATE 875-125 MG PO TABS: 1.0000 | ORAL_TABLET | Freq: Two times a day (BID) | ORAL | 0 refills | Status: DC

## 2016-10-09 NOTE — Progress Notes (Signed)
Pt c/o cough and burning chest pain r/t cough since yesterday. Denies fever/chills, ear pain, itchy/watery eyes. Just started using Flonase yesterday.

## 2016-10-09 NOTE — Progress Notes (Signed)
Subjective:    Patient ID: Grant Wilson, male    DOB: 1966/02/19, 51 y.o.   MRN: [id number]  50y/o african [place] male here for evaluation cough, chest tightness, runny nose and nasal congestion/sore throat.  Started yesterday.  Typically gets spring allergies and takes claritin and flonase.  Started flonase this am.  Exposed to sick coworkers.  Has had itchy eyes.  Drank carbonated water drink today denied coffee/soda/caffeine.  Denied nausea/vomiting/hemoptysis/rash/fever/chills/ear pain/sinus pain.      Review of Systems  Constitutional: Negative for activity change, appetite change, chills, diaphoresis, fatigue, fever and unexpected weight change.  HENT: Positive for congestion, postnasal drip, rhinorrhea, sneezing and sore throat. Negative for dental problem, drooling, ear discharge, ear pain, facial swelling, hearing loss, mouth sores, nosebleeds, sinus pain, sinus pressure, tinnitus, trouble swallowing and voice change.   Eyes: Negative for photophobia, pain, discharge, redness, itching and visual disturbance.  Respiratory: Positive for cough and chest tightness. Negative for choking, shortness of breath, wheezing and stridor.   Cardiovascular: Negative for chest pain, palpitations and leg swelling.  Gastrointestinal: Negative for abdominal distention, abdominal pain, blood in stool, constipation, diarrhea, nausea and vomiting.  Endocrine: Negative for cold intolerance and heat intolerance.  Genitourinary: Negative for dysuria.  Musculoskeletal: Negative for arthralgias, back pain, gait problem, joint swelling, myalgias, neck pain and neck stiffness.  Skin: Negative for color change, pallor, rash and wound.  Allergic/Immunologic: Positive for environmental allergies. Negative for food allergies and immunocompromised state.  Neurological: Positive for headaches. Negative for dizziness, tremors, seizures, syncope, facial asymmetry, speech difficulty, weakness, light-headedness and  numbness.  Hematological: Negative for adenopathy. Does not bruise/bleed easily.  Psychiatric/Behavioral: Negative for agitation, behavioral problems, confusion and sleep disturbance.       Objective:   Physical Exam  Constitutional: He is oriented to person, place, and time. He appears well-developed and well-nourished. He is active and cooperative.  Non-toxic appearance. He does not have a sickly appearance. He does not appear ill. No distress.  HENT:  Head: Normocephalic and atraumatic.  Right Ear: Hearing, external ear and ear canal normal. A middle ear effusion is present.  Left Ear: Hearing, external ear and ear canal normal. A middle ear effusion is present.  Nose: Mucosal edema and rhinorrhea present. No nose lacerations, sinus tenderness, nasal deformity, septal deviation or nasal septal hematoma. No epistaxis.  No foreign bodies. Right sinus exhibits no maxillary sinus tenderness and no frontal sinus tenderness. Left sinus exhibits no maxillary sinus tenderness and no frontal sinus tenderness.  Mouth/Throat: Uvula is midline and mucous membranes are normal. Mucous membranes are not pale, not dry and not cyanotic. He does not have dentures. No oral lesions. No trismus in the jaw. Normal dentition. No dental abscesses, uvula swelling, lacerations or dental caries. Posterior oropharyngeal edema and posterior oropharyngeal erythema present. No oropharyngeal exudate or tonsillar abscesses.  Cobblestoning posterior pharynx; bilateral TMs with air fluid level right clear; left central opacity and slight erythema; macular erythema oropharynx; bilateral nasal turbinates edema/erythema  Eyes: Conjunctivae, EOM and lids are normal. Pupils are equal, round, and reactive to light. Right eye exhibits no chemosis, no discharge, no exudate and no hordeolum. No foreign body present in the right eye. Left eye exhibits no chemosis, no discharge, no exudate and no hordeolum. No foreign body present in the left  eye. Right conjunctiva is not injected. Right conjunctiva has no hemorrhage. Left conjunctiva is not injected. Left conjunctiva has no hemorrhage. No scleral icterus. Right eye exhibits normal extraocular motion and  no nystagmus. Left eye exhibits normal extraocular motion and no nystagmus. Right pupil is round and reactive. Left pupil is round and reactive. Pupils are equal.  Neck: Trachea normal and normal range of motion. Neck supple. No tracheal tenderness, no spinous process tenderness and no muscular tenderness present. No neck rigidity. No tracheal deviation, no edema, no erythema and normal range of motion present. No thyroid mass and no thyromegaly present.  Cardiovascular: Normal rate, regular rhythm, S1 normal, S2 normal, normal heart sounds and intact distal pulses.  PMI is not displaced.  Exam reveals no gallop and no friction rub.   No murmur heard. Pulmonary/Chest: Effort normal. No stridor. No respiratory distress. He has no decreased breath sounds. He has wheezes in the right lower field. He has no rhonchi. He has no rales.  Speaks full sentences; cough not observed; inspiratory wheeze fine right LL  Abdominal: Soft. He exhibits no distension.  Musculoskeletal: Normal range of motion. He exhibits no edema or tenderness.       Right shoulder: Normal.       Left shoulder: Normal.       Right elbow: Normal.      Left elbow: Normal.       Right wrist: Normal.       Left wrist: Normal.       Right hip: Normal.       Left hip: Normal.       Right knee: Normal.       Left knee: Normal.       Cervical back: Normal.       Right hand: Normal.       Left hand: Normal.  Lymphadenopathy:       Head (right side): No submental, no submandibular, no tonsillar, no preauricular, no posterior auricular and no occipital adenopathy present.       Head (left side): No submental, no submandibular, no tonsillar, no preauricular, no posterior auricular and no occipital adenopathy present.    He has  no cervical adenopathy.       Right cervical: No superficial cervical, no deep cervical and no posterior cervical adenopathy present.      Left cervical: No superficial cervical, no deep cervical and no posterior cervical adenopathy present.  Neurological: He is alert and oriented to person, place, and time. He displays no atrophy and no tremor. No cranial nerve deficit or sensory deficit. He exhibits normal muscle tone. He displays no seizure activity. Coordination and gait normal. GCS eye subscore is 4. GCS verbal subscore is 5. GCS motor subscore is 6.  Skin: Skin is warm, dry and intact. No abrasion, no bruising, no burn, no ecchymosis, no laceration, no lesion, no petechiae and no rash noted. He is not diaphoretic. No cyanosis or erythema. No pallor. Nails show no clubbing.  Psychiatric: He has a normal mood and affect. His speech is normal and behavior is normal. Judgment and thought content normal. Cognition and memory are normal.  Nursing note and vitals reviewed.         Assessment & Plan:  A-acute bronchitis, left otitis media nonsupportive acute; allergic rhinitis  P- Hydrate discussed with patient heart rate increased from normal illness can cause that along with dehydration.  Keep urine pale yellow clear Restart claritin 10mg  by mouth daily Continue flonase nasal spray 1 spray each nostril twice a day at home Start nasal saline 2 sprays each nostril every 2 hours while awake Augmentin 875mg  by mouth twice a day Tessalon pearles 200mg  by  mouth three times per day as needed for cough Albuterol 54mcg MDI 1-2 puffs by mouth every 4-6 hours as needed for chest tightness/wheezing/protracted cough Shower twice a day  Supportive treatment.   No evidence of invasive bacterial infection, non toxic and well hydrated.  This is most likely self limiting viral infection.  I do not see where any further testing or imaging is necessary at this time.   I will suggest supportive care, rest, good  hygiene and encourage the patient to take adequate fluids.  The patient is to return to clinic or EMERGENCY ROOM if symptoms worsen or change significantly e.g. ear pain, fever, purulent discharge from ears or bleeding.  Augmentin for bronchitis also covers otitis media and sinusitis discussed with patient. Patient verbalized agreement and understanding of treatment plan and had no further questions at this time.  Restart flonase 1 spray each nostril BID, saline 2 sprays each nostril q2h prn congestion.  If no improvement with 48 hours of saline and flonase use start augmentin 875mg  po BID x 10 days.  Rx given.  No evidence of systemic bacterial infection, non toxic and well hydrated.  I do not see where any further testing or imaging is necessary at this time.   I will suggest supportive care, rest, good hygiene and encourage the patient to take adequate fluids.  The patient is to return to clinic or EMERGENCY ROOM if symptoms worsen or change significantly.  Exitcare handout on sinusitis given to patient.  Patient verbalized agreement and understanding of treatment plan and had no further questions at this time.   P2:  Hand washing and cover cough  Rx albuterol MDI and tessalon pearles given to patient electronically to pharmacy of choice.  augmentin 875mg  po BID x 10 days #20 RF0 from [facility] due to wheezing/chest tightness lower lobe and tachycardia.  Bronchitis simple, community acquired, may have started as viral (probably respiratory syncytial, parainfluenza, influenza, or adenovirus), but now evidence of acute purulent bronchitis with resultant bronchial edema and mucus formation.  Viruses are the most common cause of bronchial inflammation in otherwise healthy adults with acute bronchitis.  The appearance of sputum is not predictive of whether a bacterial infection is present.  Purulent sputum is most often caused by viral infections.  There are a small portion of those caused by non-viral agents being  Mycoplamsa pneumonia.  Microscopic examination or C&S of sputum in the healthy adult with acute bronchitis is generally not helpful (usually negative or normal respiratory flora) other considerations being cough from upper respiratory tract infections, sinusitis or allergic syndromes (mild asthma or viral pneumonia).  Differential Diagnosis:  reactive airway disease (asthma, allergic aspergillosis (eosinophilia), chronic bronchitis, respiratory infection (Sinusitis, Common cold, pneumonia), congestive heart failure, reflux esophagitis, bronchogenic tumor, aspiration syndromes and/or exposure irritants/tobacco smoke.  In this case, there is no evidence of any invasive bacterial illness.  Most likely viral etiology so will hold on antibiotic treatment.  Advise supportive care with rest, encourage fluids, good hygiene and watch for any worsening symptoms.  If they were to develop:  come back to the office or go to the emergency room if after hours. Without high fever, severe dyspnea, lack of physical findings or other risk factors, I will hold on a chest radiograph and CBC at this time. I discussed that approximately 50% of patients with acute bronchitis have a cough that lasts up to three weeks, and 25% for over a month.  Tylenol, one to two tablets every four hours as  needed for fever or myalgias.   No aspirin.  Patient instructed to follow up in one week or sooner if symptoms worsen. Patient verbalized agreement and understanding of treatment plan.  P2:  hand washing and cover cough  Patient may use normal saline nasal spray as needed.  Consider antihistamine or nasal steroid use.  Avoid triggers if possible.  Shower prior to bedtime if exposed to triggers.  If allergic dust/dust mites recommend mattress/pillow covers/encasements; washing linens, vacuuming, sweeping, dusting weekly.  Call or return to clinic as needed if these symptoms worsen or fail to improve as anticipated.   Patient verbalized understanding  of instructions, agreed with plan of care and had no further questions at this time.  P2:  Avoidance and hand washing.

## 2016-10-09 NOTE — Patient Instructions (Signed)
Hydrate Restart claritin 10mg  by mouth daily Continue flonase nasal spray 1 spray each nostril twice a day at home Start nasal saline 2 sprays each nostril every 2 hours while awake Augmentin 875mg  by mouth twice a day Tessalon pearles 200mg  by mouth three times per day as needed for cough Albuterol 35mcg MDI 1-2 puffs by mouth every 4-6 hours as needed for chest tightness/wheezing/protracted cough Shower twice a day

## 2016-10-10 ENCOUNTER — Ambulatory Visit
Admission: RE | Admit: 2016-10-10 | Discharge: 2016-10-10 | Disposition: A | Discharge status: Home / Self Care | Payer: PRIVATE HEALTH INSURANCE | Source: Ambulatory Visit | Attending: Gastroenterology | Admitting: Gastroenterology

## 2016-10-10 ENCOUNTER — Other Ambulatory Visit: Payer: PRIVATE HEALTH INSURANCE

## 2016-10-10 DIAGNOSIS — B181 Chronic viral hepatitis B without delta-agent: Secondary | ICD-10-CM

## 2016-10-11 ENCOUNTER — Ambulatory Visit (INDEPENDENT_AMBULATORY_CARE_PROVIDER_SITE_OTHER): Payer: PRIVATE HEALTH INSURANCE | Admitting: Physician Assistant

## 2016-10-11 VITALS — BP 124/61 | HR 89 | Temp 98.8°F | Ht 66.0 in | Wt 159.0 lb

## 2016-10-11 DIAGNOSIS — J22 Unspecified acute lower respiratory infection: Secondary | ICD-10-CM

## 2016-10-11 NOTE — Patient Instructions (Addendum)
Continue to hydrate well. Take the antibiotic to completion, even if you feel better.       IF you received an x-ray today, you will receive an invoice from [facility]. Please contact [facility] at [phone] with questions or concerns regarding your invoice.   IF you received labwork today, you will receive an invoice from [facility]. Please contact [facility] at [phone] with questions or concerns regarding your invoice.   Our billing staff will not be able to assist you with questions regarding bills from these companies.  You will be contacted with the lab results as soon as they are available. The fastest way to get your results is to activate your My Chart account. Instructions are located on the last page of this paperwork. If you have not heard from [place] regarding the results in 2 weeks, please contact this office.

## 2016-10-13 NOTE — Progress Notes (Signed)
[facility] [address], [place] [address] [phone]  Date:  10/11/2016   Name:  Grant Wilson   DOB:  12/23/1965   MRN:  [id number]  PCP:  No primary care provider on file.    History of Present Illness:  Grant Wilson is a 51 y.o. male patient who presents to [facility] for cc of cough, warm sensation.    Patient was having several days of coughing, difficulty with breathing and fever.  Three days ago, he was treated by his work provider with Augmentin, supportive cough treatment, and breathing treatment.  He states that he has taken three days of the Augmentin.  He is having great improvement of his symptoms.  But at night, he is feeling very warm.  No dizziness.  He is tolerating fluids well.  He is not checking his temperature.  No sob or dyspnea.  His coughing is improved and non-productive.       Patient Active Problem List   Diagnosis Date Noted  . DDD (degenerative disc disease), lumbar 01/11/2016  . Chronic low back pain 07/22/2012  . Elevated cholesterol 05/12/2012  . Seasonal allergies 05/12/2012  . Hepatitis B 09/22/2011    Past Medical History:  Diagnosis Date  . Allergy   . GERD (gastroesophageal reflux disease)   . Hepatitis B   . Hyperlipidemia     Past Surgical History:  Procedure Laterality Date  . NO PAST SURGERIES      Social History  Substance Use Topics  . Smoking status: Never Smoker  . Smokeless tobacco: Never Used  . Alcohol use No    History reviewed. No pertinent family history.  No Known Allergies  Medication list has been reviewed and updated.  Current Outpatient Prescriptions on File Prior to Visit  Medication Sig Dispense Refill  . albuterol (PROVENTIL HFA;VENTOLIN HFA) 108 (90 Base) MCG/ACT inhaler Inhale 1-2 puffs into the lungs every 4 (four) hours as needed for wheezing or shortness of breath. 1 Inhaler 0  . amoxicillin-clavulanate (AUGMENTIN) 875-125 MG tablet Take 1 tablet by mouth every 12 (twelve)  hours. 14 tablet 0  . benzonatate (TESSALON) 200 MG capsule Take 1 capsule (200 mg total) by mouth 2 (two) times daily as needed for cough. 20 capsule 0  . esomeprazole (NEXIUM) 20 MG capsule Take 40 mg by mouth daily at 12 noon.    . fluticasone (FLONASE) 50 MCG/ACT nasal spray Place 2 sprays into both nostrils daily. 16 g 12  . tenofovir (VIREAD) 300 MG tablet Take 300 mg by mouth daily. Reported on 01/11/2016     No current facility-administered medications on file prior to visit.     ROS ROS otherwise unremarkable unless listed above.   Physical Examination: BP 124/61 (BP Location: Right Arm, Patient Position: Sitting, Cuff Size: Small)   Pulse 89   Temp 98.8 F (37.1 C) (Oral)   Ht 5\' 6"  (1.676 m)   Wt 159 lb (72.1 kg)   SpO2 97%   BMI 25.66 kg/m  Ideal Body Weight: Weight in (lb) to have BMI = 25: 154.6  Physical Exam  Constitutional: He is oriented to person, place, and time. He appears well-developed and well-nourished. No distress.  HENT:  Head: Normocephalic and atraumatic.  Right Ear: Tympanic membrane, external ear and ear canal normal.  Left Ear: Tympanic membrane, external ear and ear canal normal.  Nose: No mucosal edema or rhinorrhea. Right sinus exhibits no maxillary sinus tenderness and no frontal sinus  tenderness. Left sinus exhibits no maxillary sinus tenderness and no frontal sinus tenderness.  Mouth/Throat: No uvula swelling. No oropharyngeal exudate, posterior oropharyngeal edema or posterior oropharyngeal erythema.  Eyes: Conjunctivae, EOM and lids are normal. Pupils are equal, round, and reactive to light. Right eye exhibits normal extraocular motion. Left eye exhibits normal extraocular motion.  Neck: Trachea normal and full passive range of motion without pain. No edema and no erythema present.  Cardiovascular: Normal rate.   Pulmonary/Chest: Effort normal. No accessory muscle usage. No apnea. No respiratory distress. He has no decreased breath sounds. He  has no wheezes. He has no rhonchi.  Neurological: He is alert and oriented to person, place, and time.  Skin: Skin is warm and dry. He is not diaphoretic.  Psychiatric: He has a normal mood and affect. His behavior is normal.     Assessment and Plan: Grant Wilson is a 51 y.o. male who is here today for cough, and warmth sensation. Advised to purchase a thermometer.  Symptoms appear to improving.  Continue prescriptions, and anti-pyretic use advised.  The Augmentin will cover for a typical pneumonia.  Advised to follow up if fever continues after 48 hours, sob, dyspnea, etc.  Advised hydration. Lower respiratory infection (e.g., bronchitis, pneumonia, pneumonitis, pulmonitis)  Ivar Drape, PA-C [facility] 2/26/20187:53 AM

## 2016-11-08 ENCOUNTER — Ambulatory Visit (INDEPENDENT_AMBULATORY_CARE_PROVIDER_SITE_OTHER): Payer: PRIVATE HEALTH INSURANCE | Admitting: Emergency Medicine

## 2016-11-08 VITALS — BP 135/74 | HR 68 | Temp 97.8°F | Resp 16 | Ht 66.0 in | Wt 157.6 lb

## 2016-11-08 DIAGNOSIS — K219 Gastro-esophageal reflux disease without esophagitis: Secondary | ICD-10-CM | POA: Diagnosis not present

## 2016-11-08 DIAGNOSIS — R1013 Epigastric pain: Secondary | ICD-10-CM | POA: Diagnosis not present

## 2016-11-08 DIAGNOSIS — R1011 Right upper quadrant pain: Secondary | ICD-10-CM | POA: Insufficient documentation

## 2016-11-08 LAB — POCT URINALYSIS DIP (MANUAL ENTRY)
BILIRUBIN UA: NEGATIVE
BILIRUBIN UA: NEGATIVE
GLUCOSE UA: NEGATIVE
LEUKOCYTES UA: NEGATIVE
NITRITE UA: NEGATIVE
Protein Ur, POC: NEGATIVE
Spec Grav, UA: 1.015 (ref 1.030–1.035)
UROBILINOGEN UA: 0.2 (ref ?–2.0)
pH, UA: 7 (ref 5.0–8.0)

## 2016-11-08 MED ORDER — RANITIDINE HCL 300 MG PO TABS: 300.0000 mg | ORAL_TABLET | Freq: Every day | ORAL | 2 refills | Status: DC

## 2016-11-08 MED ORDER — OMEPRAZOLE 40 MG PO CPDR: 40.0000 mg | DELAYED_RELEASE_CAPSULE | Freq: Every day | ORAL | 3 refills | Status: DC

## 2016-11-08 NOTE — Progress Notes (Signed)
Grant Wilson 51 y.o.   Chief Complaint  Patient presents with  . Abdominal Pain    mild pain and burning sensation off and on    HISTORY OF PRESENT ILLNESS: This is a 51 y.o. male complaining of burning upper abdominal pain on and off for several months. Abdomen U/S done 10/10/16 reviewed. Unremarkable except for hepatic hemangioma. Abdominal Pain  This is a recurrent problem. The current episode started more than 1 month ago. The onset quality is gradual. The problem occurs intermittently. The problem has been waxing and waning. The pain is located in the epigastric region. The pain is at a severity of 3/10. The pain is mild. The quality of the pain is burning. The abdominal pain does not radiate. Pertinent negatives include no anorexia, constipation, diarrhea, dysuria, fever, headaches, hematochezia, hematuria, melena, myalgias, nausea, vomiting or weight loss. Nothing aggravates the pain. The pain is relieved by eating. He has tried H2 blockers for the symptoms. Prior diagnostic workup includes ultrasound. His past medical history is significant for GERD. There is no history of abdominal surgery, colon cancer, Crohn's disease, gallstones, pancreatitis, PUD or ulcerative colitis.     Prior to Admission medications   Medication Sig Start Date End Date Taking? Authorizing Provider  tenofovir (VIREAD) 300 MG tablet Take 300 mg by mouth daily. Reported on 01/11/2016   Yes Historical Provider, MD  albuterol (PROVENTIL HFA;VENTOLIN HFA) 108 (90 Base) MCG/ACT inhaler Inhale 1-2 puffs into the lungs every 4 (four) hours as needed for wheezing or shortness of breath. Patient not taking: Reported on 11/08/2016 10/09/16   Olen Cordial, NP  amoxicillin-clavulanate (AUGMENTIN) 875-125 MG tablet Take 1 tablet by mouth every 12 (twelve) hours. Patient not taking: Reported on 11/08/2016 10/09/16   Olen Cordial, NP  benzonatate (TESSALON) 200 MG capsule Take 1 capsule (200 mg total) by mouth 2 (two)  times daily as needed for cough. Patient not taking: Reported on 11/08/2016 10/09/16   Olen Cordial, NP  esomeprazole (NEXIUM) 20 MG capsule Take 40 mg by mouth daily at 12 noon.    Historical Provider, MD  fluticasone (FLONASE) 50 MCG/ACT nasal spray Place 2 sprays into both nostrils daily. Patient not taking: Reported on 11/08/2016 01/11/16   Harrison Mons, PA-C    No Known Allergies  Patient Active Problem List   Diagnosis Date Noted  . DDD (degenerative disc disease), lumbar 01/11/2016  . Chronic low back pain 07/22/2012  . Elevated cholesterol 05/12/2012  . Seasonal allergies 05/12/2012  . Hepatitis B 09/22/2011    Past Medical History:  Diagnosis Date  . Allergy   . GERD (gastroesophageal reflux disease)   . Hepatitis B   . Hyperlipidemia     Past Surgical History:  Procedure Laterality Date  . NO PAST SURGERIES      Social History   Social History  . Marital status: Single    Spouse name: n/a  . Number of children: 0  . Years of education: associates   Occupational History  . IT department     [employer]   Social History Main Topics  . Smoking status: Never Smoker  . Smokeless tobacco: Never Used  . Alcohol use No  . Drug use: No  . Sexual activity: Yes    Partners: Female    Birth control/ protection: Condom   Other Topics Concern  . Not on file   Social History Narrative   From [place]. Came to the [place] in 1997.   Lives alone.  No children    No family history on file.   Review of Systems  Constitutional: Negative for fever and weight loss.  HENT: Negative.   Eyes: Negative.   Respiratory: Negative for cough, hemoptysis, shortness of breath and wheezing.   Cardiovascular: Negative for chest pain, palpitations and leg swelling.  Gastrointestinal: Positive for abdominal pain. Negative for anorexia, constipation, diarrhea, hematochezia, melena, nausea and vomiting.  Genitourinary: Negative for dysuria, flank pain and hematuria.    Musculoskeletal: Negative for back pain, joint pain, myalgias and neck pain.  Skin: Negative for rash.  Neurological: Negative for dizziness, sensory change, focal weakness and headaches.  Endo/Heme/Allergies: Negative.   Psychiatric/Behavioral: Negative.   All other systems reviewed and are negative.   Vitals:   11/08/16 0825  BP: 135/74  Pulse: 68  Resp: 16  Temp: 97.8 F (36.6 C)    Physical Exam  Constitutional: He is oriented to person, place, and time. He appears well-developed and well-nourished.  HENT:  Head: Normocephalic and atraumatic.  Right Ear: External ear normal.  Left Ear: External ear normal.  Nose: Nose normal.  Mouth/Throat: Oropharynx is clear and moist. No oropharyngeal exudate.  Eyes: Conjunctivae and EOM are normal. Pupils are equal, round, and reactive to light.  Neck: Normal range of motion. Neck supple. No JVD present. No thyromegaly present.  Cardiovascular: Normal rate, regular rhythm, normal heart sounds and intact distal pulses.   Pulmonary/Chest: Effort normal and breath sounds normal.  Abdominal: Soft. Bowel sounds are normal. He exhibits no distension. There is no tenderness.  Musculoskeletal: Normal range of motion.  Lymphadenopathy:    He has no cervical adenopathy.  Neurological: He is alert and oriented to person, place, and time. No sensory deficit. He exhibits normal muscle tone.  Skin: Skin is warm and dry. Capillary refill takes less than 2 seconds.  Psychiatric: He has a normal mood and affect. His behavior is normal.  Vitals reviewed.    ASSESSMENT & PLAN: Grant Wilson was seen today for abdominal pain.  Diagnoses and all orders for this visit:  Abdominal pain, epigastric -     CBC with Differential/Platelet -     Comprehensive metabolic panel -     POCT urinalysis dipstick -     H. pylori breath test -     Ambulatory referral to Gastroenterology  Gastroesophageal reflux disease without esophagitis -     Ambulatory referral  to Gastroenterology  Other orders -     omeprazole (PRILOSEC) 40 MG capsule; Take 1 capsule (40 mg total) by mouth daily. -     ranitidine (ZANTAC) 300 MG tablet; Take 1 tablet (300 mg total) by mouth at bedtime.    Patient Instructions       IF you received an x-ray today, you will receive an invoice from [facility]. Please contact [facility] at [phone] with questions or concerns regarding your invoice.   IF you received labwork today, you will receive an invoice from [facility]. Please contact [facility] at [phone] with questions or concerns regarding your invoice.   Our billing staff will not be able to assist you with questions regarding bills from these companies.  You will be contacted with the lab results as soon as they are available. The fastest way to get your results is to activate your My Chart account. Instructions are located on the last page of this paperwork. If you have not heard from [place] regarding the results in 2 weeks, please contact this office.  Abdominal Pain, Adult Many things can cause belly (abdominal) pain. Most times, belly pain is not dangerous. Many cases of belly pain can be watched and treated at home. Sometimes belly pain is serious, though. Your doctor will try to find the cause of your belly pain. Follow these instructions at home:  Take over-the-counter and prescription medicines only as told by your doctor. Do not take medicines that help you poop (laxatives) unless told to by your doctor.  Drink enough fluid to keep your pee (urine) clear or pale yellow.  Watch your belly pain for any changes.  Keep all follow-up visits as told by your doctor. This is important. Contact a doctor if:  Your belly pain changes or gets worse.  You are not hungry, or you lose weight without trying.  You are having trouble pooping (constipated) or have watery poop (diarrhea) for more than 2-3 days.  You have pain when you pee or  poop.  Your belly pain wakes you up at night.  Your pain gets worse with meals, after eating, or with certain foods.  You are throwing up and cannot keep anything down.  You have a fever. Get help right away if:  Your pain does not go away as soon as your doctor says it should.  You cannot stop throwing up.  Your pain is only in areas of your belly, such as the right side or the left lower part of the belly.  You have bloody or black poop, or poop that looks like tar.  You have very bad pain, cramping, or bloating in your belly.  You have signs of not having enough fluid or water in your body (dehydration), such as:  Dark pee, very little pee, or no pee.  Cracked lips.  Dry mouth.  Sunken eyes.  Sleepiness.  Weakness. This information is not intended to replace advice given to you by your health care provider. Make sure you discuss any questions you have with your health care provider. Document Released: 01/21/2008 Document Revised: 02/22/2016 Document Reviewed: 01/16/2016 Elsevier Interactive Patient Education  2017 Else[employer] Inc.      Agustina Caroli, MD [facility]

## 2016-11-08 NOTE — Patient Instructions (Addendum)
     IF you received an x-ray today, you will receive an invoice from [facility]. Please contact [facility] at [phone] with questions or concerns regarding your invoice.   IF you received labwork today, you will receive an invoice from [facility]. Please contact [facility] at [phone] with questions or concerns regarding your invoice.   Our billing staff will not be able to assist you with questions regarding bills from these companies.  You will be contacted with the lab results as soon as they are available. The fastest way to get your results is to activate your My Chart account. Instructions are located on the last page of this paperwork. If you have not heard from us regarding the results in 2 weeks, please contact this office.     Abdominal Pain, Adult Many things can cause belly (abdominal) pain. Most times, belly pain is not dangerous. Many cases of belly pain can be watched and treated at home. Sometimes belly pain is serious, though. Your doctor will try to find the cause of your belly pain. Follow these instructions at home:  Take over-the-counter and prescription medicines only as told by your doctor. Do not take medicines that help you poop (laxatives) unless told to by your doctor.  Drink enough fluid to keep your pee (urine) clear or pale yellow.  Watch your belly pain for any changes.  Keep all follow-up visits as told by your doctor. This is important. Contact a doctor if:  Your belly pain changes or gets worse.  You are not hungry, or you lose weight without trying.  You are having trouble pooping (constipated) or have watery poop (diarrhea) for more than 2-3 days.  You have pain when you pee or poop.  Your belly pain wakes you up at night.  Your pain gets worse with meals, after eating, or with certain foods.  You are throwing up and cannot keep anything down.  You have a fever. Get help right away if:  Your pain does not go away  as soon as your doctor says it should.  You cannot stop throwing up.  Your pain is only in areas of your belly, such as the right side or the left lower part of the belly.  You have bloody or black poop, or poop that looks like tar.  You have very bad pain, cramping, or bloating in your belly.  You have signs of not having enough fluid or water in your body (dehydration), such as:  Dark pee, very little pee, or no pee.  Cracked lips.  Dry mouth.  Sunken eyes.  Sleepiness.  Weakness. This information is not intended to replace advice given to you by your health care provider. Make sure you discuss any questions you have with your health care provider. Document Released: 01/21/2008 Document Revised: 02/22/2016 Document Reviewed: 01/16/2016 Elsevier Interactive Patient Education  2017 [employer].  

## 2016-11-09 LAB — CBC WITH DIFFERENTIAL/PLATELET
BASOS ABS: 0 10*3/uL (ref 0.0–0.2)
Basos: 0 %
EOS (ABSOLUTE): 0.1 10*3/uL (ref 0.0–0.4)
Eos: 2 %
Hematocrit: 48.5 % (ref 37.5–51.0)
Hemoglobin: 16.6 g/dL (ref 13.0–17.7)
IMMATURE GRANULOCYTES: 0 %
Immature Grans (Abs): 0 10*3/uL (ref 0.0–0.1)
Lymphocytes Absolute: 1.9 10*3/uL (ref 0.7–3.1)
Lymphs: 55 %
MCH: 31.8 pg (ref 26.6–33.0)
MCHC: 34.2 g/dL (ref 31.5–35.7)
MCV: 93 fL (ref 79–97)
Monocytes Absolute: 0.4 10*3/uL (ref 0.1–0.9)
Monocytes: 13 %
NEUTROS PCT: 30 %
Neutrophils Absolute: 1 10*3/uL — ABNORMAL LOW (ref 1.4–7.0)
PLATELETS: 230 10*3/uL (ref 150–379)
RBC: 5.22 x10E6/uL (ref 4.14–5.80)
RDW: 13.6 % (ref 12.3–15.4)
WBC: 3.4 10*3/uL (ref 3.4–10.8)

## 2016-11-09 LAB — COMPREHENSIVE METABOLIC PANEL
A/G RATIO: 1.5 (ref 1.2–2.2)
ALT: 34 IU/L (ref 0–44)
AST: 28 IU/L (ref 0–40)
Albumin: 4.6 g/dL (ref 3.5–5.5)
Alkaline Phosphatase: 53 IU/L (ref 39–117)
BUN/Creatinine Ratio: 13 (ref 9–20)
BUN: 14 mg/dL (ref 6–24)
Bilirubin Total: 0.6 mg/dL (ref 0.0–1.2)
CHLORIDE: 99 mmol/L (ref 96–106)
CO2: 26 mmol/L (ref 18–29)
CREATININE: 1.1 mg/dL (ref 0.76–1.27)
Calcium: 9.4 mg/dL (ref 8.7–10.2)
GFR calc Af Amer: 89 mL/min/{1.73_m2} (ref >59)
GFR calc non Af Amer: 77 mL/min/{1.73_m2} (ref >59)
Globulin, Total: 3 g/dL (ref 1.5–4.5)
Glucose: 94 mg/dL (ref 65–99)
POTASSIUM: 4.8 mmol/L (ref 3.5–5.2)
Sodium: 140 mmol/L (ref 134–144)
Total Protein: 7.6 g/dL (ref 6.0–8.5)

## 2016-11-10 LAB — H.PYLORI BREATH TEST (REFLEX): H. PYLORI BREATH TEST: NEGATIVE

## 2016-11-10 LAB — H. PYLORI BREATH TEST

## 2017-01-22 ENCOUNTER — Ambulatory Visit: Payer: Self-pay | Admitting: Registered Nurse

## 2017-01-22 VITALS — BP 136/86 | HR 80 | Temp 97.8°F

## 2017-01-22 DIAGNOSIS — M5441 Lumbago with sciatica, right side: Secondary | ICD-10-CM

## 2017-01-22 MED ORDER — ACETAMINOPHEN 500 MG PO TABS: 1000.0000 mg | ORAL_TABLET | Freq: Four times a day (QID) | ORAL | 0 refills | Status: AC | PRN

## 2017-01-22 MED ORDER — MENTHOL (TOPICAL ANALGESIC) 4 % EX GEL: 1.0000 "application " | Freq: Four times a day (QID) | CUTANEOUS | 0 refills | Status: AC | PRN

## 2017-01-22 NOTE — Patient Instructions (Signed)
Radicular Pain Radicular pain is a type of pain that spreads from your back or neck along a spinal nerve. Spinal nerves are nerves that leave the spinal cord and go to the muscles. Radicular pain occurs when one of these nerves becomes irritated or squeezed (compressed). Radicular pain is sometimes called radiculopathy, radiculitis, or a pinched nerve. When you have this type of pain, you may also have weakness, numbness, or tingling in the area of your body that is supplied by the nerve. The pain may feel sharp and burning. Spinal nerves leave the spinal cord through openings between the 24 bones (vertebrae) that make up the spine. Radicular pain is often caused by something pushing on a spinal nerve. This pushing may be done by a vertebra or by one of the round cushions between vertebrae (intervertebral disks). This can result from an injury, from wear and tear or aging of a disk, or from the growth of a bone spur that pushes on the nerve. Radicular pain can occur in various areas depending on which spinal nerve is affected:  Cervical radicular pain occurs in the neck. You may also feel pain, numbness, weakness, or tingling in the arms.  Thoracic radicular pain occurs in the mid-spine area. You would feel this pain in the back and chest. This type is rare.  Lumbar radicular pain occurs in the lower back area. You would feel this pain as low back pain. You may feel pain, numbness, weakness, or tingling in the buttocks or legs. Sciatica is a type of lumbar radicular pain that shoots down the back of the leg.  Radicular pain often goes away when you follow instructions from your health care provider for relieving pain at home. Follow these instructions at home: Managing pain  If directed, apply ice to the affected area: ? Put ice in a plastic bag. ? Place a towel between your skin and the bag. ? Leave the ice on for 20 minutes, 2-3 times a day.  If directed, apply heat to the affected area as often  as told by your health care provider. Use the heat source that your health care provider recommends, such as a moist heat pack or a heating pad. ? Place a towel between your skin and the heat source. ? Leave the heat on for 20-30 minutes. ? Remove the heat if your skin turns bright red. This is especially important if you are unable to feel pain, heat, or cold. You may have a greater risk of getting burned. Activity   Do not sit or rest in bed for long periods of time.  Try to stay as active as possible. Ask your health care provider what type of exercise or activity is best for you.  Avoid activities that make your pain worse, such as bending and lifting.  Do not lift anything that is heavier than 10 lb (4.5 kg). Practice using proper technique when lifting items. Proper lifting technique involves bending your knees and rising up.  Do strength and range-of-motion exercises only as told by your health care provider. General instructions  Take over-the-counter and prescription medicines only as told by your health care provider.  Pay attention to any changes in your symptoms.  Keep all follow-up visits as told by your health care provider. This is important. Contact a health care provider if:  Your pain and other symptoms get worse.  Your pain medicine is not helping.  Your pain has not improved after a few weeks of home care.    You have a fever. Get help right away if:  You have severe pain, weakness, or numbness.  You have difficulty with bladder or bowel control. This information is not intended to replace advice given to you by your health care provider. Make sure you discuss any questions you have with your health care provider. Document Released: 09/11/2004 Document Revised: 01/10/2016 Document Reviewed: 02/28/2015 Elsevier Interactive Patient Education  2018 [place] therapy can help ease sore, stiff, injured, and tight muscles and joints. Heat  relaxes your muscles, which may help ease your pain. What are the risks? If you have any of the following conditions, do not use heat therapy unless your health care provider has approved:  Poor circulation.  Healing wounds or scarred skin in the area being treated.  Diabetes, heart disease, or high blood pressure.  Not being able to feel (numbness) the area being treated.  Unusual swelling of the area being treated.  Active infections.  Blood clots.  Cancer.  Inability to communicate pain. This may include young children and people who have problems with their brain function (dementia).  Pregnancy.  Heat therapy should only be used on old, pre-existing, or long-lasting (chronic) injuries. Do not use heat therapy on new injuries unless directed by your health care provider. How to use heat therapy There are several different kinds of heat therapy, including:  Moist heat pack.  Warm water bath.  Hot water bottle.  Electric heating pad.  Heated gel pack.  Heated wrap.  Electric heating pad.  Use the heat therapy method suggested by your health care provider. Follow your health care provider's instructions on when and how to use heat therapy. General heat therapy recommendations  Do not sleep while using heat therapy. Only use heat therapy while you are awake.  Your skin may turn pink while using heat therapy. Do not use heat therapy if your skin turns red.  Do not use heat therapy if you have new pain.  High heat or long exposure to heat can cause burns. Be careful when using heat therapy to avoid burning your skin.  Do not use heat therapy on areas of your skin that are already irritated, such as with a rash or sunburn. Contact a health care provider if:  You have blisters, redness, swelling, or numbness.  You have new pain.  Your pain is worse. This information is not intended to replace advice given to you by your health care provider. Make sure you  discuss any questions you have with your health care provider. Document Released: 10/27/2011 Document Revised: 01/10/2016 Document Reviewed: 09/27/2013 Elsevier Interactive Patient Education  2018 [place]. Cryotherapy WHAT IS CRYOTHERAPY? Cryotherapy, or cold therapy, is a treatment that uses cold temperatures to treat an injury or medical condition. It includes using cold packs or ice packs to reduce pain and swelling. WHO SHOULD NOT USE CRYOTHERAPY? Cryotherapy is not safe for people who cannot tell you if they are in pain, such as small children and people who have dementia. Cryotherapy is also not safe for people with certain conditions, such as:  Raynaud phenomenon.  Cold hypersensitivity.  Numbness or loss of feeling in the area being iced.  Cryotherapy may or may not be safe for people with certain other conditions. Do not use cryotherapy without your health care provider's approval if you have:  A heart condition.  High blood pressure.  Open or healing wounds.  An infection.  Rheumatoid arthritis.  Poor circulation.  Diabetes.  Certain skin conditions.  HOW DO I USE CRYOTHERAPY? To use cryotherapy at home to reduce pain and swelling:  Place a towel between the cold source and your skin.  Apply the cold source for no more than 20 minutes at a time.  Check your skin after 5 minutes to make sure there are no signs of a poor response to cold or skin damage. Check for: ? White spots on your skin. Your skin may look blotchy or mottled. ? Skin that looks blue or pale. ? Skin that feels waxy or hard.  Repeat these steps as many times each day as told by your health care provider.  HOW CAN I MAKE A COLD PACK? When using a cold pack at home to reduce pain and swelling, you can use:  A silica gel cold pack that has been left in the freezer. You can buy this online or in stores.  A plastic bag of frozen vegetables.  A sealable plastic bag that has been filled  with crushed ice.  Always wrap the pack in a dry or damp towel to avoid direct contact with your skin. WHEN SHOULD I CALL MY HEALTH CARE PROVIDER? Call your health care provider if:  You develop white spots on your skin. This may give your skin a blotchy or mottled look.  Your skin turns blue or pale.  Your skin becomes waxy or hard.  Your swelling gets worse.  This information is not intended to replace advice given to you by your health care provider. Make sure you discuss any questions you have with your health care provider. Document Released: 03/31/2011 Document Revised: 01/10/2016 Document Reviewed: 04/18/2015 Elsevier Interactive Patient Education  2017 [place]. Sciatica Rehab Ask your health care provider which exercises are safe for you. Do exercises exactly as told by your health care provider and adjust them as directed. It is normal to feel mild stretching, pulling, tightness, or discomfort as you do these exercises, but you should stop right away if you feel sudden pain or your pain gets worse.Do not begin these exercises until told by your health care provider. Stretching and range of motion exercises These exercises warm up your muscles and joints and improve the movement and flexibility of your hips and your back. These exercises also help to relieve pain, numbness, and tingling. Exercise A: Sciatic nerve glide 1. Sit in a chair with your head facing down toward your chest. Place your hands behind your back. Let your shoulders slump forward. 2. Slowly straighten one of your knees while you tilt your head back as if you are looking toward the ceiling. Only straighten your leg as far as you can without making your symptoms worse. 3. Hold for __________ seconds. 4. Slowly return to the starting position. 5. Repeat with your other leg. Repeat __________ times. Complete this exercise __________ times a day. Exercise B: Knee to chest with hip adduction and internal  rotation  1. Lie on your back on a firm surface with both legs straight. 2. Bend one of your knees and move it up toward your chest until you feel a gentle stretch in your lower back and buttock. Then, move your knee toward the shoulder that is on the opposite side from your leg. ? Hold your leg in this position by holding onto the front of your knee. 3. Hold for __________ seconds. 4. Slowly return to the starting position. 5. Repeat with your other leg. Repeat __________ times. Complete this exercise __________ times a  day. Exercise C: Prone extension on elbows  1. Lie on your abdomen on a firm surface. A bed may be too soft for this exercise. 2. Prop yourself up on your elbows. 3. Use your arms to help lift your chest up until you feel a gentle stretch in your abdomen and your lower back. ? This will place some of your body weight on your elbows. If this is uncomfortable, try stacking pillows under your chest. ? Your hips should stay down, against the surface that you are lying on. Keep your hip and back muscles relaxed. 4. Hold for __________ seconds. 5. Slowly relax your upper body and return to the starting position. Repeat __________ times. Complete this exercise __________ times a day. Strengthening exercises These exercises build strength and endurance in your back. Endurance is the ability to use your muscles for a long time, even after they get tired. Exercise D: Pelvic tilt 1. Lie on your back on a firm surface. Bend your knees and keep your feet flat. 2. Tense your abdominal muscles. Tip your pelvis up toward the ceiling and flatten your lower back into the floor. ? To help with this exercise, you may place a small towel under your lower back and try to push your back into the towel. 3. Hold for __________ seconds. 4. Let your muscles relax completely before you repeat this exercise. Repeat __________ times. Complete this exercise __________ times a day. Exercise E:  Alternating arm and leg raises  1. Get on your hands and knees on a firm surface. If you are on a hard floor, you may want to use padding to cushion your knees, such as an exercise mat. 2. Line up your arms and legs. Your hands should be below your shoulders, and your knees should be below your hips. 3. Lift your left leg behind you. At the same time, raise your right arm and straighten it in front of you. ? Do not lift your leg higher than your hip. ? Do not lift your arm higher than your shoulder. ? Keep your abdominal and back muscles tight. ? Keep your hips facing the ground. ? Do not arch your back. ? Keep your balance carefully, and do not hold your breath. 4. Hold for __________ seconds. 5. Slowly return to the starting position and repeat with your right leg and your left arm. Repeat __________ times. Complete this exercise __________ times a day. Posture and body mechanics  Body mechanics refers to the movements and positions of your body while you do your daily activities. Posture is part of body mechanics. Good posture and healthy body mechanics can help to relieve stress in your body's tissues and joints. Good posture means that your spine is in its natural S-curve position (your spine is neutral), your shoulders are pulled back slightly, and your head is not tipped forward. The following are general guidelines for applying improved posture and body mechanics to your everyday activities. Standing   When standing, keep your spine neutral and your feet about hip-width apart. Keep a slight bend in your knees. Your ears, shoulders, and hips should line up.  When you do a task in which you stand in one place for a long time, place one foot up on a stable object that is 2-4 inches (5-10 cm) high, such as a footstool. This helps keep your spine neutral. Sitting   When sitting, keep your spine neutral and keep your feet flat on the floor. Use a footrest, if necessary,  and keep your  thighs parallel to the floor. Avoid rounding your shoulders, and avoid tilting your head forward.  When working at a desk or a computer, keep your desk at a height where your hands are slightly lower than your elbows. Slide your chair under your desk so you are close enough to maintain good posture.  When working at a computer, place your monitor at a height where you are looking straight ahead and you do not have to tilt your head forward or downward to look at the screen. Resting   When lying down and resting, avoid positions that are most painful for you.  If you have pain with activities such as sitting, bending, stooping, or squatting (flexion-based activities), lie in a position in which your body does not bend very much. For example, avoid curling up on your side with your arms and knees near your chest (fetal position).  If you have pain with activities such as standing for a long time or reaching with your arms (extension-based activities), lie with your spine in a neutral position and bend your knees slightly. Try the following positions: ? Lying on your side with a pillow between your knees. ? Lying on your back with a pillow under your knees. Lifting   When lifting objects, keep your feet at least shoulder-width apart and tighten your abdominal muscles.  Bend your knees and hips and keep your spine neutral. It is important to lift using the strength of your legs, not your back. Do not lock your knees straight out.  Always ask for help to lift heavy or awkward objects. This information is not intended to replace advice given to you by your health care provider. Make sure you discuss any questions you have with your health care provider. Document Released: 08/04/2005 Document Revised: 04/10/2016 Document Reviewed: 04/20/2015 Elsevier Interactive Patient Education  [employer].

## 2017-01-22 NOTE — Progress Notes (Signed)
Subjective:    Patient ID: Grant Wilson, male    DOB: October 10, 1965, 51 y.o.   MRN: [id number]  51y/o african [place] male reports waking up with lower back pain x5 days now. Gradually improves after stretching and moving throughout the day. Starts at beginning each day; improves with stretching/work. In addition to stretching, using epsom salt and biofreeze spray. No known injury. Previously has had similar pain >1 year ago per chart review 2014 strain lumbar given diclofenac.  Patient does not want to take diclofenac again as worried his GERD worse now could worsen it.  Denied loss of bowel/bladder control, saddle paresthesias, arm/leg weakness.  Has noticed that pain radiates from lower back to front of right upper thigh occasionally.  Muscles feel tight in morning and AROM improves as day progresses.  Has never used muscle relaxers.  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      Review of Systems  Constitutional: Negative for activity change, appetite change, chills, diaphoresis, fatigue and fever.  HENT: Negative for congestion, ear pain and sore throat.   Eyes: Negative for pain and discharge.  Respiratory: Negative for cough and wheezing.   Cardiovascular: Negative for chest pain and leg swelling.  Gastrointestinal: Positive for abdominal pain. Negative for blood in stool, constipation, diarrhea, nausea and vomiting.  Genitourinary: Negative for difficulty urinating, dysuria and hematuria.  Musculoskeletal: Positive for back pain and myalgias. Negative for arthralgias, gait problem, joint swelling, neck pain and neck stiffness.  Skin: Negative for color change, pallor, rash and wound.  Allergic/Immunologic: Negative for environmental allergies and food allergies.  Neurological: Negative for headaches.  Hematological: Negative for adenopathy. Does not bruise/bleed easily.  Psychiatric/Behavioral: Negative for agitation, confusion and sleep disturbance. The  patient is not nervous/anxious.        Objective:   Physical Exam  Constitutional: He is oriented to person, place, and time. Vital signs are normal. He appears well-developed and well-nourished. He is cooperative.  Non-toxic appearance. He does not have a sickly appearance. He does not appear ill. No distress.  HENT:  Head: Normocephalic and atraumatic.  Right Ear: Hearing and external ear normal.  Left Ear: Hearing and external ear normal.  Nose: Nose normal.  Mouth/Throat: Uvula is midline, oropharynx is clear and moist and mucous membranes are normal. No oropharyngeal exudate, posterior oropharyngeal edema or posterior oropharyngeal erythema.  Eyes: Conjunctivae, EOM and lids are normal. Pupils are equal, round, and reactive to light. Right eye exhibits no discharge. Left eye exhibits no discharge. No scleral icterus.  Neck: Trachea normal, normal range of motion and phonation normal. Neck supple. No spinous process tenderness and no muscular tenderness present. No neck rigidity. No tracheal deviation, no edema, no erythema and normal range of motion present. No thyromegaly present.  Cardiovascular: Normal rate, regular rhythm, normal heart sounds and intact distal pulses.   Pulses:      Radial pulses are 2+ on the right side, and 2+ on the left side.  Pulmonary/Chest: Effort normal and breath sounds normal. No accessory muscle usage or stridor. No respiratory distress. He has no decreased breath sounds. He has no wheezes. He has no rhonchi. He has no rales.  Abdominal: Soft. He exhibits no distension. There is no tenderness. There is no rebound and no guarding.  Musculoskeletal: Normal range of motion. He exhibits tenderness. He exhibits no edema or deformity.       Right shoulder: Normal.       Left shoulder: Normal.  Right elbow: Normal.      Left elbow: Normal.       Right wrist: Normal.       Left wrist: Normal.       Right hip: Normal.       Left hip: Normal.       Right  knee: Normal.       Left knee: Normal.       Right ankle: Normal.       Left ankle: Normal.       Cervical back: Normal.       Thoracic back: Normal.       Lumbar back: He exhibits pain. He exhibits normal range of motion, no tenderness, no bony tenderness, no swelling, no edema, no deformity, no laceration, no spasm and normal pulse.       Back:  Right rotation right lumbar discomfort; feels tightness with forward flexion; able to touch toes; full arom; DTRs equal patellar 2+/4 brisk; bilateral negative straight leg raise; pain not increased with extension lumbar; lumbar pain with abdominal crunch/sitting up on exam table  Lymphadenopathy:       Head (right side): No submental, no submandibular, no tonsillar, no preauricular, no posterior auricular and no occipital adenopathy present.       Head (left side): No submental, no submandibular, no tonsillar, no preauricular, no posterior auricular and no occipital adenopathy present.    He has no cervical adenopathy.       Right cervical: No superficial cervical, no deep cervical and no posterior cervical adenopathy present.      Left cervical: No superficial cervical, no deep cervical and no posterior cervical adenopathy present.  Neurological: He is alert and oriented to person, place, and time. He has normal strength. He displays no atrophy and no tremor. No cranial nerve deficit or sensory deficit. He exhibits normal muscle tone. He displays no seizure activity. Coordination and gait normal. GCS eye subscore is 4. GCS verbal subscore is 5. GCS motor subscore is 6.  Reflex Scores:      Brachioradialis reflexes are 1+ on the right side and 1+ on the left side.      Patellar reflexes are 2+ on the right side and 2+ on the left side. Bilateral hand grasp equal 5/5; on/off exam table/in/out of chair without difficulty  Skin: Skin is warm, dry and intact. No rash noted. He is not diaphoretic. No cyanosis or erythema. No pallor. Nails show no clubbing.   Psychiatric: He has a normal mood and affect. His speech is normal and behavior is normal. Judgment and thought content normal. Cognition and memory are normal.  Nursing note and vitals reviewed.         Assessment & Plan:  A-acute bilateral lumbago with right sciatica  P-Patient given 5 UD tylenol 1000mg  po q6h prn pain; biofreeze 4% gel UD packages x 4 and 1 lumbar thermacare l/xl from clinic stock.  Helped with application of thermacare wrap lumbar.  Discussed to keep on x 8 hours.  Given 1 hot/cold reusable pack from clinic stock.  Discussed use/safety in microwave.  Patient refused nsaids/muscle relaxers. For acute pain, rest, and intermittent application of heat (do not sleep on heating pad) and /or ice 15 minutes QID.  I discussed longer-term treatment plan of PRN tylenol and I discussed a home back care exercise program with a strengthening and flexibility exercise.  Patient given [facility] handout on lumbago/sciatica with rehab exercises/stretches/breaks out of desk hourly.  Proper avoidance of heavy lifting  discussed.  Consider physical therapy or chiropractic care and radiology if not improving.  Call or return to clinic as needed if these symptoms worsen or fail to improve as anticipated especially leg weakness, loss of bowel/bladder control or saddle paresthesias.   Patient verbalized understanding of instructions/information and agreed with plan of care.  P2:  Injury Prevention, fitness

## 2017-02-10 IMAGING — CR DG CHEST 2V
2 series · 2 of 2 positions shown · non-contrast
Comparison: 10/04/2012

CLINICAL DATA: Shortness of breath

EXAM:
CHEST  2 VIEW

[PA]
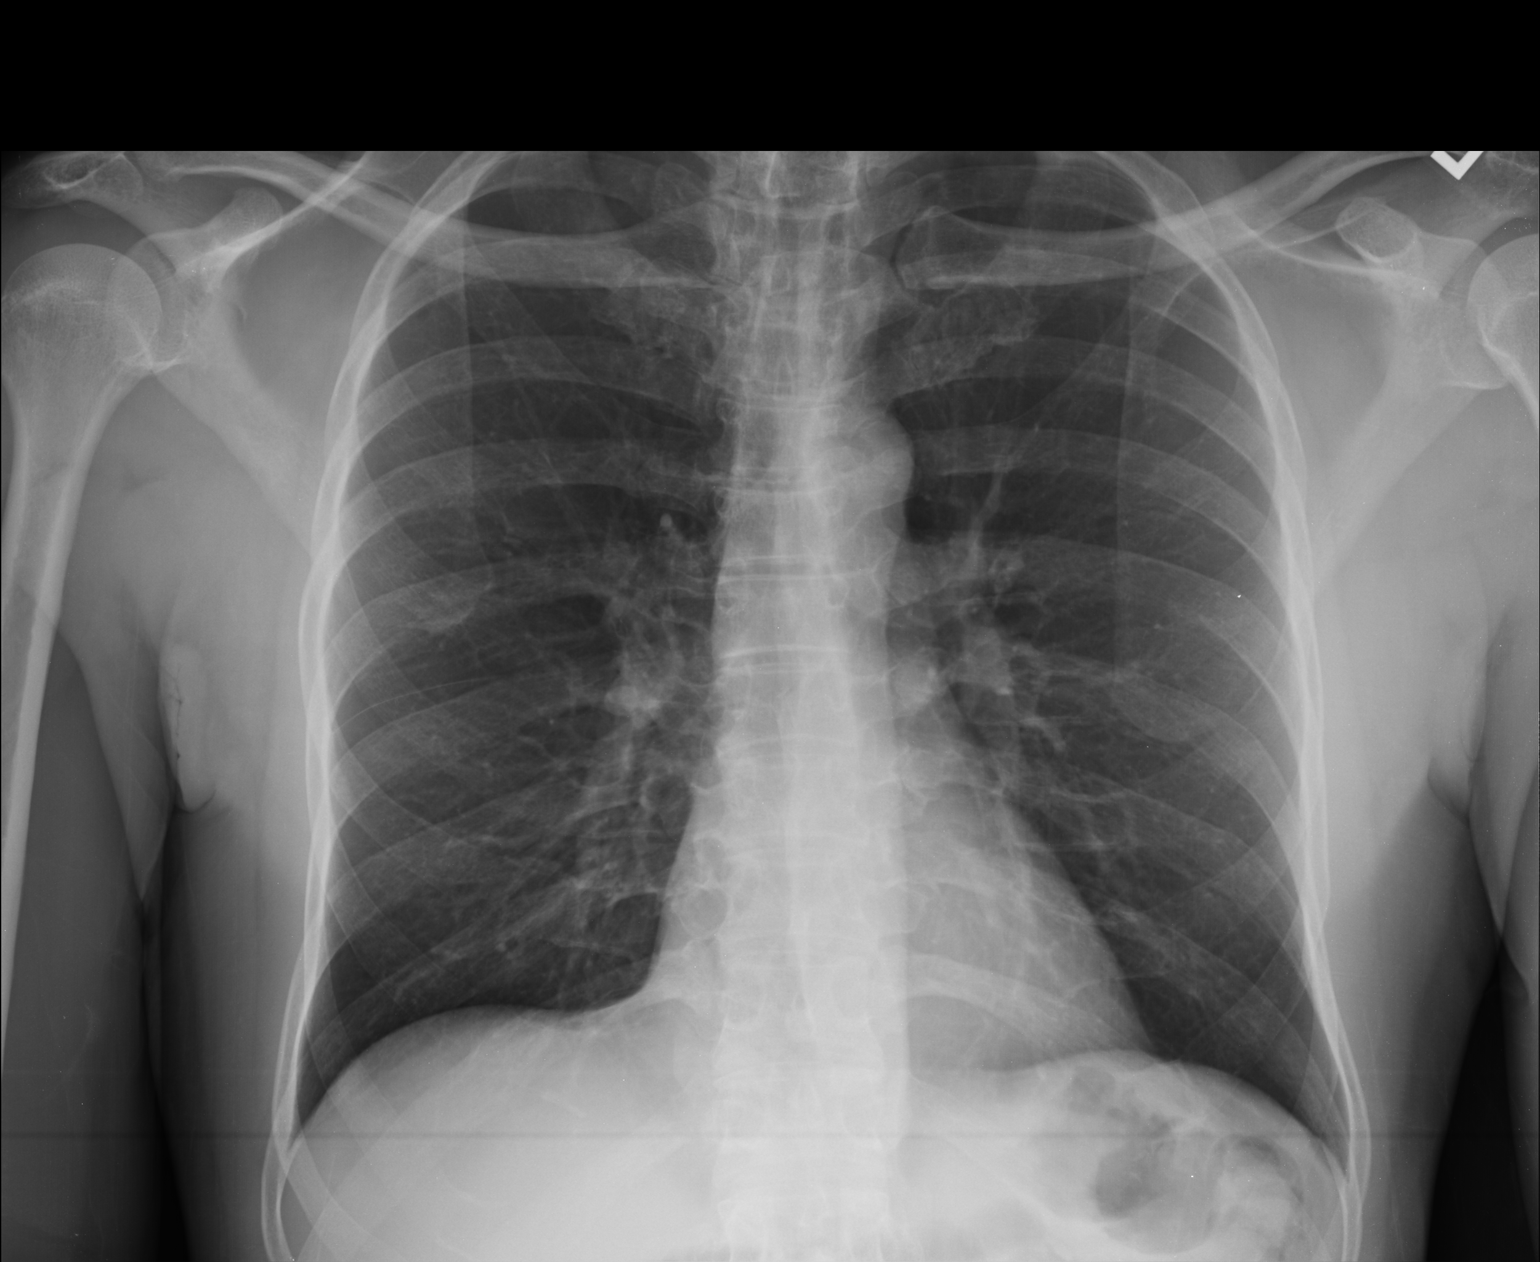

[lateral]
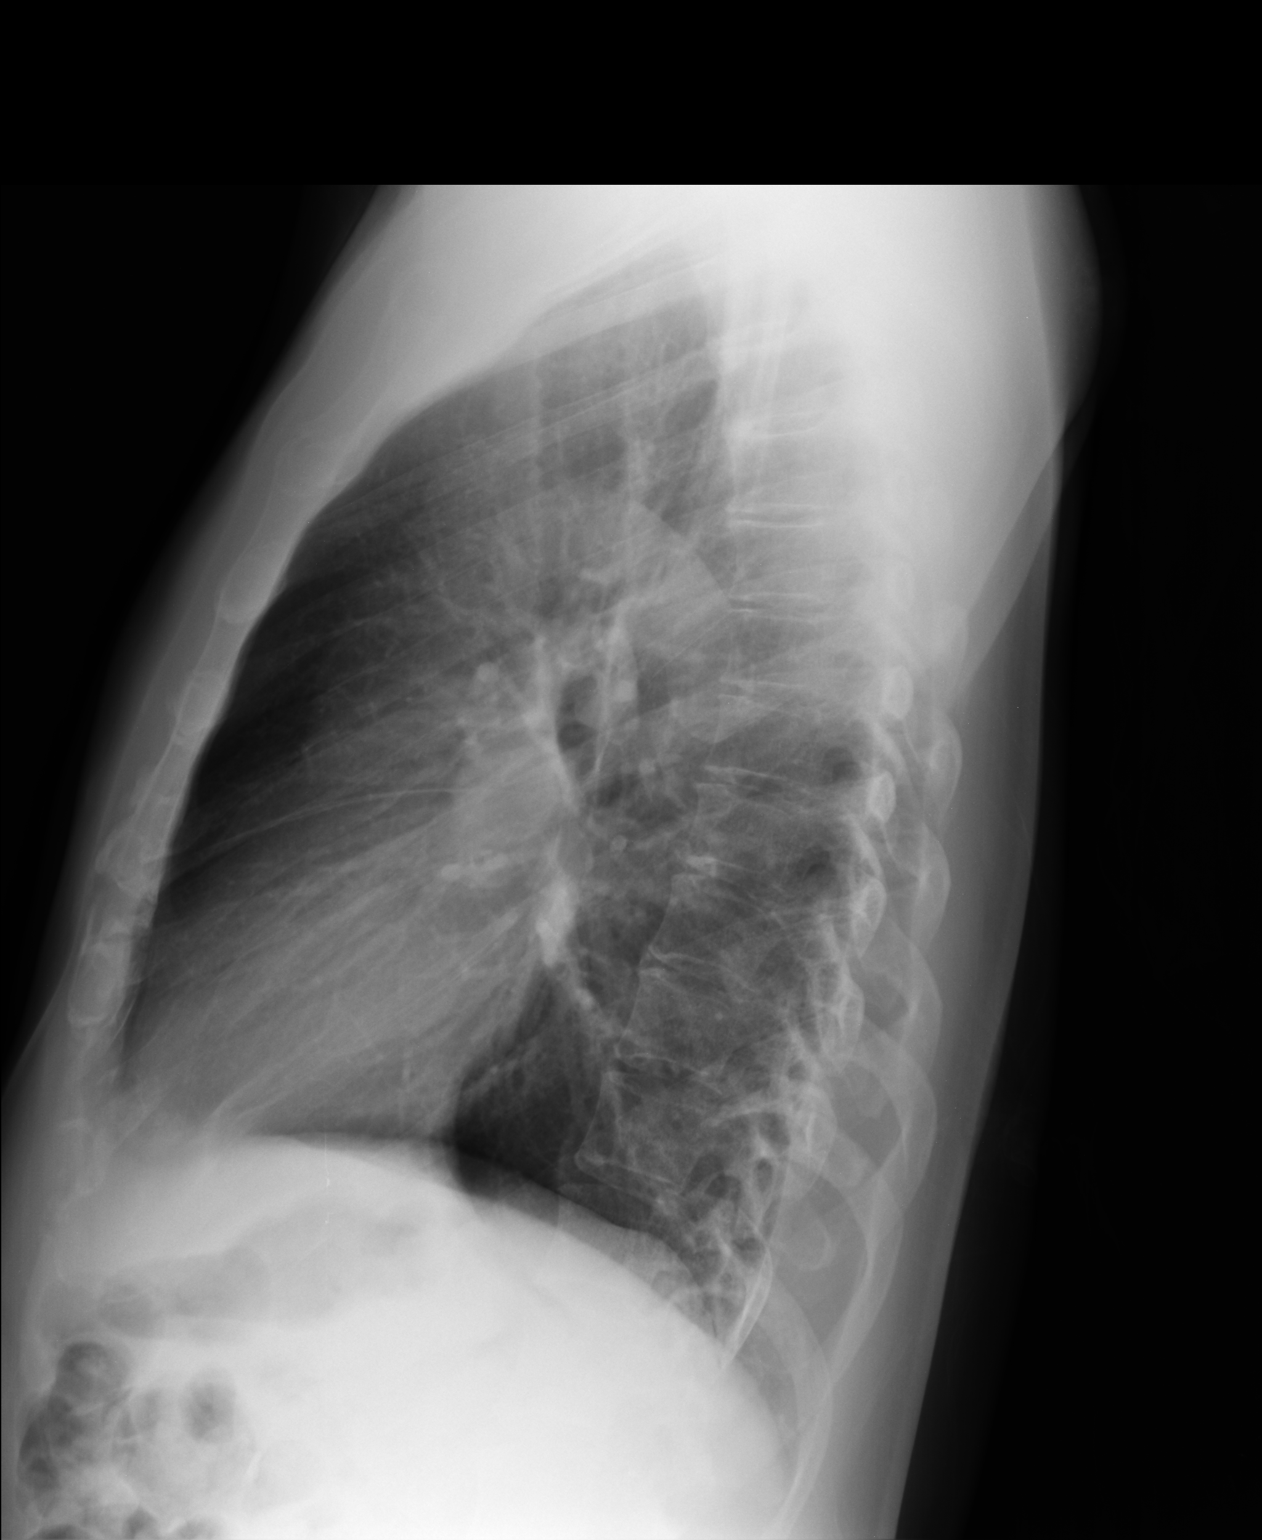

[2 of 2 positions shown; findings below may reference images not displayed]

FINDINGS: Mild hyperinflation of the lungs, stable. Heart and mediastinal
contours are within normal limits. No focal opacities or effusions.
No acute bony abnormality.
IMPRESSION: Stable mild hyperinflation.  No active disease.

## 2017-04-03 ENCOUNTER — Ambulatory Visit: Payer: Self-pay | Admitting: *Deleted

## 2017-04-03 VITALS — BP 140/86 | Ht 66.0 in | Wt 160.0 lb

## 2017-04-03 DIAGNOSIS — Z0000 Encounter for general adult medical examination without abnormal findings: Secondary | ICD-10-CM

## 2017-04-03 NOTE — Progress Notes (Signed)
[employer] insurance premium discount evaluation: Labs Drawn. Replacements ROI form signed. Tobacco Free Attestation form signed.  Forms placed in paper chart.  No pcp to route results to. Pt uses [facility] as primary care.

## 2017-04-04 ENCOUNTER — Encounter: Payer: Self-pay | Admitting: Physician Assistant

## 2017-04-04 ENCOUNTER — Ambulatory Visit (INDEPENDENT_AMBULATORY_CARE_PROVIDER_SITE_OTHER): Payer: PRIVATE HEALTH INSURANCE | Admitting: Physician Assistant

## 2017-04-04 VITALS — BP 127/83 | HR 78 | Temp 98.3°F | Resp 16 | Ht 66.0 in | Wt 160.2 lb

## 2017-04-04 DIAGNOSIS — R51 Headache: Secondary | ICD-10-CM | POA: Diagnosis not present

## 2017-04-04 DIAGNOSIS — R519 Headache, unspecified: Secondary | ICD-10-CM

## 2017-04-04 LAB — CMP12+LP+TP+TSH+6AC+PSA+CBC…
ALK PHOS: 46 IU/L (ref 39–117)
ALT: 22 IU/L (ref 0–44)
AST: 28 IU/L (ref 0–40)
Albumin/Globulin Ratio: 1.9 (ref 1.2–2.2)
Albumin: 5 g/dL (ref 3.5–5.5)
BASOS: 1 %
BUN/Creatinine Ratio: 12 (ref 9–20)
BUN: 15 mg/dL (ref 6–24)
Basophils Absolute: 0 10*3/uL (ref 0.0–0.2)
Bilirubin Total: 0.5 mg/dL (ref 0.0–1.2)
CHOL/HDL RATIO: 4.2 ratio (ref 0.0–5.0)
Calcium: 9.5 mg/dL (ref 8.7–10.2)
Chloride: 98 mmol/L (ref 96–106)
Cholesterol, Total: 212 mg/dL — ABNORMAL HIGH (ref 100–199)
Creatinine, Ser: 1.21 mg/dL (ref 0.76–1.27)
EOS (ABSOLUTE): 0 10*3/uL (ref 0.0–0.4)
EOS: 1 %
Estimated CHD Risk: 0.8 times avg. (ref 0.0–1.0)
Free Thyroxine Index: 1.6 (ref 1.2–4.9)
GFR calc Af Amer: 80 mL/min/{1.73_m2} (ref >59)
GFR calc non Af Amer: 69 mL/min/{1.73_m2} (ref >59)
GGT: 25 IU/L (ref 0–65)
GLOBULIN, TOTAL: 2.7 g/dL (ref 1.5–4.5)
Glucose: 85 mg/dL (ref 65–99)
HDL: 50 mg/dL (ref >39)
HEMATOCRIT: 46.9 % (ref 37.5–51.0)
HEMOGLOBIN: 15.7 g/dL (ref 13.0–17.7)
IMMATURE GRANS (ABS): 0 10*3/uL (ref 0.0–0.1)
IMMATURE GRANULOCYTES: 0 %
Iron: 80 ug/dL (ref 38–169)
LDH: 207 IU/L (ref 121–224)
LDL CALC: 154 mg/dL — AB (ref 0–99)
LYMPHS ABS: 1.6 10*3/uL (ref 0.7–3.1)
LYMPHS: 51 %
MCH: 31.1 pg (ref 26.6–33.0)
MCHC: 33.5 g/dL (ref 31.5–35.7)
MCV: 93 fL (ref 79–97)
MONOS ABS: 0.4 10*3/uL (ref 0.1–0.9)
Monocytes: 12 %
NEUTROS PCT: 35 %
Neutrophils Absolute: 1.1 10*3/uL — ABNORMAL LOW (ref 1.4–7.0)
PROSTATE SPECIFIC AG, SERUM: 1 ng/mL (ref 0.0–4.0)
Phosphorus: 3.5 mg/dL (ref 2.5–4.5)
Platelets: 212 10*3/uL (ref 150–379)
Potassium: 4.4 mmol/L (ref 3.5–5.2)
RBC: 5.05 x10E6/uL (ref 4.14–5.80)
RDW: 14.7 % (ref 12.3–15.4)
Sodium: 138 mmol/L (ref 134–144)
T3 Uptake Ratio: 24 % (ref 24–39)
T4, Total: 6.7 ug/dL (ref 4.5–12.0)
TSH: 1.46 u[IU]/mL (ref 0.450–4.500)
Total Protein: 7.7 g/dL (ref 6.0–8.5)
Triglycerides: 40 mg/dL (ref 0–149)
URIC ACID: 6 mg/dL (ref 3.7–8.6)
VLDL Cholesterol Cal: 8 mg/dL (ref 5–40)
WBC: 3.1 10*3/uL — AB (ref 3.4–10.8)

## 2017-04-04 LAB — HGB A1C W/O EAG: Hgb A1c MFr Bld: 5.9 % — ABNORMAL HIGH (ref 4.8–5.6)

## 2017-04-04 MED ORDER — LORATADINE 10 MG PO TABS: 10.0000 mg | ORAL_TABLET | Freq: Every day | ORAL | 0 refills | Status: DC

## 2017-04-04 MED ORDER — IBUPROFEN 800 MG PO TABS: 400.0000 mg | ORAL_TABLET | Freq: Three times a day (TID) | ORAL | 0 refills | Status: AC

## 2017-04-04 NOTE — Progress Notes (Signed)
04/04/2017 4:47 PM   DOB: [birth year] / MRN: [id number]  SUBJECTIVE:  Grant Wilson is a 51 y.o. male presenting for HA that started that started yesterday and is not getting better or worse.  Has not tried any medication as of yet.  Denies neck pain. Does associate some abnormal sneezing with mild cough. No ear, eye, mouth itching.    Tells me that he has some stiffness in his left 5th digit that seems stiff in the morning and takes about 30 seconds to work out and then will be gone for the rest of the day.   He has No Known Allergies.   He  has a past medical history of Allergy; GERD (gastroesophageal reflux disease); Hepatitis B; and Hyperlipidemia.    He  reports that he has never smoked. He has never used smokeless tobacco. He reports that he does not drink alcohol or use drugs. He  reports that he currently engages in sexual activity and has had male partners. He reports using the following method of birth control/protection: Condom. The patient  has a past surgical history that includes No past surgeries.  His family history is not on file.  Review of Systems  Constitutional: Negative for chills, diaphoresis and fever.  Respiratory: Negative for cough, hemoptysis, sputum production, shortness of breath and wheezing.   Cardiovascular: Negative for chest pain, orthopnea and leg swelling.  Gastrointestinal: Negative for nausea.  Skin: Negative for rash.  Neurological: Negative for dizziness, tingling, tremors, sensory change, speech change, focal weakness, seizures and loss of consciousness.    The problem list and medications were reviewed and updated by myself where necessary and exist elsewhere in the encounter.   OBJECTIVE:  BP 127/83 (BP Location: Right Arm, Patient Position: Sitting, Cuff Size: Large)   Pulse 78   Temp 98.3 F (36.8 C) (Oral)   Resp 16   Ht 5\' 6"  (1.676 m)   Wt 160 lb 3.2 oz (72.7 kg)   SpO2 98%   BMI 25.86 kg/m   Physical Exam    Constitutional: He is oriented to person, place, and time. He appears well-developed. He is active and cooperative.  Non-toxic appearance.  Eyes: Pupils are equal, round, and reactive to light. EOM are normal.  Cardiovascular: Normal rate.   Pulmonary/Chest: Effort normal. No tachypnea.  Neurological: He is alert and oriented to person, place, and time. He has normal strength and normal reflexes. He is not disoriented. No cranial nerve deficit or sensory deficit. He exhibits normal muscle tone. Coordination and gait normal.  Sinus HA worse with hip and neck flexion  Skin: Skin is warm and dry. He is not diaphoretic. No pallor.  Psychiatric: His behavior is normal.  Vitals reviewed.    No results found.  ASSESSMENT AND PLAN:  Grant Wilson was seen today for headache and hand pain.  Diagnoses and all orders for this visit:  Sinus headache: Likely allergic.  Will try claritin for the next 48 hours and if needed he can try 400 mg ibuprofen in a short course.  -     loratadine (CLARITIN) 10 MG tablet; Take 1 tablet (10 mg total) by mouth daily. -     ibuprofen (ADVIL,MOTRIN) 800 MG tablet; Take 0.5 tablets (400 mg total) by mouth 3 (three) times daily. Take with food.    The patient is advised to call or return to clinic if he does not see an improvement in symptoms, or to seek the care of the closest emergency department  if he worsens with the above plan.   Philis Fendt, MHS, PA-C [facility] 04/04/2017 4:47 PM

## 2017-04-06 NOTE — Progress Notes (Signed)
Results reviewed with pt. A1c, LDL and total chol elevated. Diet and exercise recommendations discussed. Handout on chol given. Referenced to [facility] website for additional diet info for prediabetes. Pt was seen at pcp the day after [facility] appt. BP was more controlled there, 127/83. He reports sinus HA was worse at [facility] appt than at pcp and thinks pain caused increased BP. WBC & neutrophils chronically low. Routine f/u pcp as pt reports pcp did not mention or review labs at last appt. Copy provided to pt. Pt declines having results routed as he uses [facility] as [facility], no specific provider. No further questions/concerns.

## 2017-06-08 ENCOUNTER — Other Ambulatory Visit: Payer: Self-pay | Admitting: Gastroenterology

## 2017-06-08 DIAGNOSIS — R1011 Right upper quadrant pain: Secondary | ICD-10-CM

## 2017-06-08 DIAGNOSIS — B181 Chronic viral hepatitis B without delta-agent: Secondary | ICD-10-CM

## 2017-06-19 ENCOUNTER — Ambulatory Visit
Admission: RE | Admit: 2017-06-19 | Discharge: 2017-06-19 | Disposition: A | Discharge status: Home / Self Care | Payer: PRIVATE HEALTH INSURANCE | Source: Ambulatory Visit | Attending: Gastroenterology | Admitting: Gastroenterology

## 2017-06-19 DIAGNOSIS — B181 Chronic viral hepatitis B without delta-agent: Secondary | ICD-10-CM

## 2017-06-19 DIAGNOSIS — R1011 Right upper quadrant pain: Secondary | ICD-10-CM

## 2017-10-20 ENCOUNTER — Ambulatory Visit: Payer: Self-pay | Admitting: Registered Nurse

## 2017-10-20 VITALS — BP 145/86 | HR 86 | Temp 98.0°F

## 2017-10-20 DIAGNOSIS — M5136 Other intervertebral disc degeneration, lumbar region: Secondary | ICD-10-CM

## 2017-10-20 DIAGNOSIS — R202 Paresthesia of skin: Secondary | ICD-10-CM

## 2017-10-20 NOTE — Patient Instructions (Addendum)
Zinc Salts tablets or capsules What is this medicine? Zinc (zingk) is needed by the body to maintain normal structure and function. It is added to a healthy diet to prevent or to treat low zinc levels. It is also used to treat Wilson's disease. This supplement may be used for other purposes; ask your health care provider or pharmacist if you have questions. This medicine may be used for other purposes; ask your health care provider or pharmacist if you have questions. COMMON BRAND NAME(S): Galzin, Zincate What should I tell my health care provider before I take this medicine? They need to know if you have any of these conditions: -too little copper in the blood -an unusual or allergic reaction to zinc, other medicines, foods, dyes, or preservatives -pregnant or trying to get pregnant -breast-feeding  What should I watch for while using this medicine? Follow a good diet. Taking a supplement does not replace the need for a balanced diet. Some foods that have zinc naturally are lean red meats, seafood (especially herring and oysters), peas, and beans. Too much of this supplement can be unsafe. Talk to your doctor or health care provider about how much is right for you. What side effects may I notice from receiving this medicine? Side effects that you should report to your doctor or health care professional as soon as possible: -allergic reactions like skin rash, itching or hives, swelling of the face, lips, or tongue -breathing problems -chest pain -feeling faint or lightheaded, falls -fever, chills, or sore throat -nausea, vomiting -ulcers or sores in mouth that do not heal -unusually weak or tired Side effects that usually do not require medical attention (report to your doctor or health care professional if they continue or are bothersome): -heartburn -stomach upset This list may not describe all possible side effects. Call your doctor for medical advice about side effects. You may  report side effects to FDA at 1-800-FDA-1088. Pyridoxine, Vitamin B6 tablets What is this medicine? PYRIDOXINE (peer i DOX een) is vitamin B6. It is added to a healthy diet to prevent or to treat low vitamin B6 levels. This medicine may be used for other purposes; ask your health care provider or pharmacist if you have questions. COMMON BRAND NAME(S): [id number] What should I tell my health care provider before I take this medicine? They need to know if you have any of the following conditions: -heart disease -an unusual or allergic reaction to B vitamins, other medicines, foods, dyes, or preservatives -pregnant or trying to get pregnant -breast-feeding How should I use this medicine? Take this medicine by mouth with a glass of water. Follow the directions on the package or prescription label. For best results take this vitamin with food. Take your medicine at regular intervals. Do not take your medicine more often than directed. Talk to your pediatrician regarding the use of this medicine in children. While this drug may be prescribed for selected conditions, precautions do apply. Overdosage: If you think you have taken too much of this medicine contact a poison control center or emergency room at once. NOTE: This medicine is only for you. Do not share this medicine with others. What if I miss a dose? If you miss a dose, take it as soon as you can. If it is almost time for your next dose, take only that dose. Do not take double or extra doses. What should I watch for while using this medicine? Follow a healthy diet. Taking a vitamin supplement does not replace the  need for a balanced diet. Some foods that have this vitamin naturally are beans, grains, vegetables, liver, meat, and eggs. Too much of this vitamin can be unsafe. Talk to your doctor or health care provider about how much is right for you. What side effects may I notice from receiving this medicine? Side effects that you should  report to your doctor or health care professional as soon as possible: -allergic reactions like skin rash, itching or hives, swelling of the face, lips, or tongue -pain, tingling, numbness in the hands or feet -seizures Side effects that usually do not require medical attention (report to your doctor or health care professional if they continue or are bothersome): -headache -upset stomach This list may not describe all possible side effects. Call your doctor for medical advice about side effects. You may report side effects to FDA at 1-800-FDA-1088. Vitamin B12 Deficiency Vitamin B12 deficiency means that your body is not getting enough vitamin B12. Your body needs vitamin B12 for important bodily functions. If you do not have enough vitamin B12 in your body, you can have health problems. Follow these instructions at home:  Take supplements only as told by your doctor. Follow the directions carefully.  Get any shots (injections) as told by your doctor. Do not miss your visits to the doctor.  Eat lots of healthy foods that contain vitamin B12. Ask your doctor if you should work with someone who is trained in how food affects health (dietitian). Foods that contain vitamin B12 include: ? Meat. ? Meat from birds (poultry). ? Fish. ? Eggs. ? Cereal and dairy products that are fortified. This means that vitamin B12 has been added to the food. Check the label on the package to see if the food is fortified.  Do not drink too much (do not abuse) alcohol.  Keep all follow-up visits as told by your doctor. This is important. Contact a doctor if:  Your symptoms come back. Get help right away if:  You have trouble breathing.  You have chest pain.  You get dizzy.  You pass out (lose consciousness). This information is not intended to replace advice given to you by your health care provider. Make sure you discuss any questions you have with your health care provider. Document Released:  07/24/2011 Document Revised: 01/10/2016 Document Reviewed: 12/20/2014 Elsevier Interactive Patient Education  2018 [place] [facility] Ask your health care provider which exercises are safe for you. Do exercises exactly as told by your health care provider and adjust them as directed. It is normal to feel mild stretching, pulling, tightness, or discomfort as you do these exercises, but you should stop right away if you feel sudden pain or your pain gets worse. Do not begin these exercises until told by your health care provider. Stretching and range of motion exercises These exercises warm up your muscles and joints and improve the movement and flexibility of your back. These exercises also help to relieve pain, numbness, and tingling. Exercise A: Single knee to chest  1. Lie on your back on a firm surface with both legs straight. 2. Bend one of your knees. Use your hands to move your knee up toward your chest until you feel a gentle stretch in your lower back and buttock. ? Hold your leg in this position by holding onto the front of your knee. ? Keep your other leg as straight as possible. 3. Hold for __________ seconds. 4. Slowly return to the starting position. 5. Repeat  with your other leg. Repeat __________ times. Complete this exercise __________ times a day. Exercise B: Prone extension on elbows  1. Lie on your abdomen on a firm surface. 2. Prop yourself up on your elbows. 3. Use your arms to help lift your chest up until you feel a gentle stretch in your abdomen and your lower back. ? This will place some of your body weight on your elbows. If this is uncomfortable, try stacking pillows under your chest. ? Your hips should stay down, against the surface that you are lying on. Keep your hip and back muscles relaxed. 4. Hold for __________ seconds. 5. Slowly relax your upper body and return to the starting position. Repeat __________ times. Complete this exercise  __________ times a day. Strengthening exercises These exercises build strength and endurance in your back. Endurance is the ability to use your muscles for a long time, even after they get tired. Exercise C: Pelvic tilt 1. Lie on your back on a firm surface. Bend your knees and keep your feet flat. 2. Tense your abdominal muscles. Tip your pelvis up toward the ceiling and flatten your lower back into the floor. ? To help with this exercise, you may place a small towel under your lower back and try to push your back into the towel. 3. Hold for __________ seconds. 4. Let your muscles relax completely before you repeat this exercise. Repeat __________ times. Complete this exercise __________ times a day. Exercise D: Alternating arm and leg raises  1. Get on your hands and knees on a firm surface. If you are on a hard floor, you may want to use padding to cushion your knees, such as an exercise mat. 2. Line up your arms and legs. Your hands should be below your shoulders, and your knees should be below your hips. 3. Lift your left leg behind you. At the same time, raise your right arm and straighten it in front of you. ? Do not lift your leg higher than your hip. ? Do not lift your arm higher than your shoulder. ? Keep your abdominal and back muscles tight. ? Keep your hips facing the ground. ? Do not arch your back. ? Keep your balance carefully, and do not hold your breath. 4. Hold for __________ seconds. 5. Slowly return to the starting position and repeat with your right leg and your left arm. Repeat __________ times. Complete this exercise __________times a day. Exercise J: Single leg lower with bent knees 1. Lie on your back on a firm surface. 2. Tense your abdominal muscles and lift your feet off the floor, one foot at a time, so your knees and hips are bent in an "L" shape (at about 90 degrees). ? Your knees should be over your hips and your lower legs should be parallel to the  floor. 3. Keeping your abdominal muscles tense and your knee bent, slowly lower one of your legs so your toe touches the ground. 4. Lift your leg back up to return to the starting position. ? Do not hold your breath. ? Do not let your back arch. Keep your back flat against the ground. 5. Repeat with your other leg. Repeat __________ times. Complete this exercise __________ times a day. Posture and body mechanics  Body mechanics refers to the movements and positions of your body while you do your daily activities. Posture is part of body mechanics. Good posture and healthy body mechanics can help to relieve stress in your body's tissues  and joints. Good posture means that your spine is in its natural S-curve position (your spine is neutral), your shoulders are pulled back slightly, and your head is not tipped forward. The following are general guidelines for applying improved posture and body mechanics to your everyday activities. Standing   When standing, keep your spine neutral and your feet about hip-width apart. Keep a slight bend in your knees. Your ears, shoulders, and hips should line up.  When you do a task in which you stand in one place for a long time, place one foot up on a stable object that is 2-4 inches (5-10 cm) high, such as a footstool. This helps keep your spine neutral. Sitting   When sitting, keep your spine neutral and keep your feet flat on the floor. Use a footrest, if necessary, and keep your thighs parallel to the floor. Avoid rounding your shoulders, and avoid tilting your head forward.  When working at a desk or a computer, keep your desk at a height where your hands are slightly lower than your elbows. Slide your chair under your desk so you are close enough to maintain good posture.  When working at a computer, place your monitor at a height where you are looking straight ahead and you do not have to tilt your head forward or downward to look at the  screen. Resting   When lying down and resting, avoid positions that are most painful for you.  If you have pain with activities such as sitting, bending, stooping, or squatting (flexion-based activities), lie in a position in which your body does not bend very much. For example, avoid curling up on your side with your arms and knees near your chest (fetal position).  If you have pain with activities such as standing for a long time or reaching with your arms (extension-based activities), lie with your spine in a neutral position and bend your knees slightly. Try the following positions: ? Lying on your side with a pillow between your knees. ? Lying on your back with a pillow under your knees. Lifting   When lifting objects, keep your feet at least shoulder-width apart and tighten your abdominal muscles.  Bend your knees and hips and keep your spine neutral. It is important to lift using the strength of your legs, not your back. Do not lock your knees straight out.  Always ask for help to lift heavy or awkward objects. This information is not intended to replace advice given to you by your health care provider. Make sure you discuss any questions you have with your health care provider. Document Released: 08/04/2005 Document Revised: 04/10/2016 Document Reviewed: 05/16/2015 Elsevier Interactive Patient Education  2018 [place]. Degenerative Disk Disease Degenerative disk disease is a condition caused by the changes that occur in spinal disks as you grow older. Spinal disks are soft and compressible disks located between the bones of your spine (vertebrae). These disks act like shock absorbers. Degenerative disk disease can affect the whole spine. However, the neck and lower back are most commonly affected. Many changes can occur in the spinal disks with aging, such as:  The spinal disks may dry and shrink.  Small tears may occur in the tough, outer covering of the disk  (annulus).  The disk space may become smaller due to loss of water.  Abnormal growths in the bone (spurs) may occur. This can put pressure on the nerve roots exiting the spinal canal, causing pain.  The spinal canal may  become narrowed.  What increases the risk?  Being overweight.  Having a family history of degenerative disk disease.  Smoking.  There is increased risk if you are doing heavy lifting or have a sudden injury. What are the signs or symptoms? Symptoms vary from person to person and may include:  Pain that varies in intensity. Some people have no pain, while others have severe pain. The location of the pain depends on the part of your backbone that is affected. ? You will have neck or arm pain if a disk in the neck area is affected. ? You will have pain in your back, buttocks, or legs if a disk in the lower back is affected.  Pain that becomes worse while bending, reaching up, or with twisting movements.  Pain that may start gradually and then get worse as time passes. It may also start after a major or minor injury.  Numbness or tingling in the arms or legs.  How is this diagnosed? Your health care provider will ask you about your symptoms and about activities or habits that may cause the pain. He or she may also ask about any injuries, diseases, or treatments you have had. Your health care provider will examine you to check for the range of movement that is possible in the affected area, to check for strength in your extremities, and to check for sensation in the areas of the arms and legs supplied by different nerve roots. You may also have:  An X-ray of the spine.  Other imaging tests, such as MRI.  How is this treated? Your health care provider will advise you on the best plan for treatment. Treatment may include:  Medicines.  Rehabilitation exercises.  Follow these instructions at home:  Follow proper lifting and walking techniques as advised by your  health care provider.  Maintain good posture.  Exercise regularly as advised by your health care provider.  Perform relaxation exercises.  Change your sitting, standing, and sleeping habits as advised by your health care provider.  Change positions frequently.  Lose weight or maintain a healthy weight as advised by your health care provider.  Do not use any tobacco products, including cigarettes, chewing tobacco, or electronic cigarettes. If you need help quitting, ask your health care provider.  Wear supportive footwear.  Take medicines only as directed by your health care provider. Contact a health care provider if:  Your pain does not go away within 1-4 weeks.  You have significant appetite or weight loss. Get help right away if:  Your pain is severe.  You notice weakness in your arms, hands, or legs.  You begin to lose control of your bladder or bowel movements.  You have fevers or night sweats. This information is not intended to replace advice given to you by your health care provider. Make sure you discuss any questions you have with your health care provider. Document Released: 06/01/2007 Document Revised: 01/10/2016 Document Reviewed: 12/06/2013 Elsevier Interactive Patient Education  2018 [place] Nerve A pinched nerve is a type of injury that occurs when too much pressure is placed on a nerve. This pressure can cause pain, burning, and muscle weakness in places such as your arm, hand, back, leg, or neck. A nerve can become permanently damaged if it is severely pinched or if it has been pinched for a long time. What are the causes? This condition may be caused by:  The passing of a nerve through a narrow area between bones or  other body structures.  Loss of blood supply to a nerve.  A nerve being stretched from an injury.  A sudden injury with swelling.  Wear and tear that occurs over several years.  Changes that occur in the spine with  age.  What are the signs or symptoms? The most common symptom of a pinched nerve is a tingling feeling or numbness. Other symptoms include:  Pain that radiates from the affected nerve to the body part that the nerve supplies.  A burning feeling.  Muscle weakness in the muscles supplied by the injured nerve.  How is this diagnosed? This condition is diagnosed with a physical exam. During the exam, a health care provider will check for numbness and muscle weakness and move the affected body parts to test for pain. You may also have other tests, such as:  X-rays to check for bone damage.  An MRI or CT scan to check for nerve damage.  Electromyography (EMG) to check for electrical signals passing through nerves to muscles.  How is this treated? The first treatment for a pinched nerve is usually rest and supportive devices, such as a splint, brace, or neck collar. Additional treatment depends on symptoms and the amount of nerve damage. This can include:  Medicines, such as: ? Numbing medicine injections. ? Nonsteroidal anti-inflammatory drugs (NSAIDs). ? Steroid medicines in pill form or by injection.  Physical therapy to relieve pain, maintain movement, and improve muscle strength.  Surgery. This may be done if other treatments do not work.  Follow these instructions at home:  Only take medicines as directed by your health care provider.  Wear supportive or protective devices as directed by your health care provider.  Do stretching and strengthening exercises at home as directed by your health care provider.  Rest as needed.  Keep all follow-up visits as directed by your health care provider. This is important. Contact a health care provider if:  Your condition does not improve with treatment.  Your pain, numbness, or weakness suddenly gets worse. This information is not intended to replace advice given to you by your health care provider. Make sure you discuss any  questions you have with your health care provider. Document Released: 07/25/2002 Document Revised: 01/10/2016 Document Reviewed: 05/10/2014 Elsevier Interactive Patient Education  [employer].

## 2017-10-20 NOTE — Progress Notes (Signed)
Subjective:    Patient ID: Grant Wilson, male    DOB: May 21, 1966, 52 y.o.   MRN: [id number]  51y/o african american male established Pt sts for past 2 weeks, lateral aspect R foot has warm sensation upon standing.  Low back stiff on awakening improves with walking around and stretching. This resolves upon sitting or walking. Is not painful, is just warm and present. Does not describe as a numbing sensation. Also concerned that when he sleeps on his sides, that respective arm will fall asleep and become numb but once he is no longer on side, and begins moving arm, the numbness resolves. He sts this has not happened to him prior to the last few weeks. Stopped eating red meat when told cholesterol high.  A friend told him to stop eating beans said it was bad for him so he cut those out also.  Eating mainly chicken breasts, some nuts and vegetables.  No matter what I do my blood sugar slowly going up.   Rash and dry skin bilateral feet so he has been applying coconut oil to bilateral feet for dry skin.  PMHx elevated HgbA1c and cholesterol, Hepatitis B, GERD, degenerative disc disease L5-S1, spurring L4-L5 on imaging, renal and liver cysts      Review of Systems  Constitutional: Negative for activity change, appetite change, chills and fever.  HENT: Negative for congestion, ear pain, sore throat, trouble swallowing and voice change.   Eyes: Negative for pain and discharge.  Respiratory: Negative for cough and wheezing.   Cardiovascular: Negative for chest pain and leg swelling.  Gastrointestinal: Negative for blood in stool, constipation, diarrhea, nausea and vomiting.  Endocrine: Negative for cold intolerance and heat intolerance.  Genitourinary: Negative for difficulty urinating, dysuria, enuresis, flank pain and hematuria.  Musculoskeletal: Positive for back pain and myalgias. Negative for arthralgias, gait problem, joint swelling, neck pain and neck stiffness.  Skin: Positive for color  change and rash. Negative for pallor and wound.  Allergic/Immunologic: Positive for environmental allergies. Negative for food allergies.  Neurological: Positive for numbness. Negative for dizziness, tremors, seizures, syncope, facial asymmetry, speech difficulty, weakness, light-headedness and headaches.  Hematological: Negative for adenopathy. Does not bruise/bleed easily.  Psychiatric/Behavioral: Negative for agitation, confusion and sleep disturbance. The patient is not nervous/anxious.        Objective:   Physical Exam  Constitutional: He is oriented to person, place, and time. Vital signs are normal. He appears well-developed and well-nourished. He is active and cooperative.  Non-toxic appearance. He does not have a sickly appearance. He does not appear ill. No distress.  HENT:  Head: Normocephalic and atraumatic.  Right Ear: Hearing and external ear normal.  Left Ear: Hearing and external ear normal.  Nose: Nose normal.  Mouth/Throat: Uvula is midline, oropharynx is clear and moist and mucous membranes are normal. Mucous membranes are not pale, not dry and not cyanotic. He does not have dentures. No oral lesions. No trismus in the jaw. Normal dentition. No dental abscesses, uvula swelling, lacerations or dental caries. No oropharyngeal exudate, posterior oropharyngeal edema, posterior oropharyngeal erythema or tonsillar abscesses.  Eyes: Conjunctivae, EOM and lids are normal. Pupils are equal, round, and reactive to light. Right eye exhibits no discharge. Left eye exhibits no discharge. No scleral icterus.  Neck: Trachea normal, normal range of motion and phonation normal. Neck supple. No muscular tenderness present. No neck rigidity. No tracheal deviation, no edema, no erythema and normal range of motion present.  Cardiovascular: Normal rate, regular  rhythm, S1 normal, normal heart sounds and intact distal pulses. Exam reveals no gallop, no distant heart sounds and no friction rub.  No  murmur heard. Pulses:      Radial pulses are 2+ on the right side, and 2+ on the left side.       Dorsalis pedis pulses are 2+ on the right side, and 2+ on the left side.       Posterior tibial pulses are 2+ on the right side, and 2+ on the left side.  Pulmonary/Chest: Effort normal and breath sounds normal. No accessory muscle usage or stridor. No respiratory distress. He has no decreased breath sounds. He has no wheezes. He has no rhonchi. He has no rales. He exhibits no tenderness.  Abdominal: Soft. Normal appearance. He exhibits no distension, no fluid wave and no ascites. There is no rigidity and no guarding.  Musculoskeletal: Normal range of motion. He exhibits no edema, tenderness or deformity.       Right shoulder: Normal.       Left shoulder: Normal.       Right elbow: Normal.      Left elbow: Normal.       Right hip: Normal.       Left hip: Normal.       Right knee: Normal.       Left knee: Normal.       Right ankle: Normal.       Left ankle: Normal.       Cervical back: Normal.       Thoracic back: Normal.       Lumbar back: He exhibits pain. He exhibits normal range of motion, no tenderness, no bony tenderness, no swelling, no edema, no deformity, no laceration, no spasm and normal pulse.       Back:       Right hand: Normal.       Left hand: Normal.       Right lower leg: Normal.       Left lower leg: Normal.       Right foot: Normal.       Left foot: Normal.       Feet:  Negative straight leg raise full AROM;  Right leg raise with assist results in slight right paraspinal pain at waistband on jeans on right side only mild per patient; on/off exam table sitting/lying prone/supine without difficulty and quickly moves from each position smoothly; able to touch toes with slightly bending knees; bilateral feed nontender to palpation no swelling  Lymphadenopathy:    He has no cervical adenopathy.  Neurological: He is alert and oriented to person, place, and time. He has  normal strength. He is not disoriented. He displays abnormal reflex. He displays no atrophy and no tremor. No cranial nerve deficit or sensory deficit. He exhibits normal muscle tone. He displays no seizure activity. Coordination and gait normal. GCS eye subscore is 4. GCS verbal subscore is 5. GCS motor subscore is 6.  Reflex Scores:      Patellar reflexes are 3+ on the right side and 3+ on the left side.      Achilles reflexes are 3+ on the right side and 3+ on the left side. hyperreflexive DTRs; normal heel toe gait  Skin: Skin is warm, dry and intact. Rash noted. No abrasion, no bruising, no burn, no ecchymosis, no laceration, no lesion, no petechiae and no purpura noted. Rash is macular. Rash is not papular, not maculopapular, not nodular, not pustular,  not vesicular and not urticarial. He is not diaphoretic. No cyanosis or erythema. No pallor. Nails show no clubbing.  Medial soles of feet grouped hyperpigmented nummular areas over plantar fascia; entire sole of foot with callous/increased thickness skin some scaling  Psychiatric: He has a normal mood and affect. His speech is normal and behavior is normal. Judgment and thought content normal. Cognition and memory are normal.          Assessment & Plan:  A-DDD L5-S1, paresthesias right foot  P-Reviewed Epic Care everywhere patient with history DDD/DJD lumbar imaging on file spurring L4-L5 and decreased disc height L5-S1.  Could be symptoms of pinching nerve L5 with lateral foot paresthesias, elevated blood sugar ? Neuropathy related and/or B6/B12/zinc deficiency as patient has been altering diet to help his cholesterol cut out all red meat, beans, not much dairy or seafood intake.  Mostly chicken and some nuts his only lean protein intake.  Given handout on zinc/B6/B12 food sources and try to eat 1-2 of these foods every days at a meal and monitor symptoms.  Back stiffness worse in am better upon walking around and stretching.  Cold weather has  been in area x 1 month and again this week lower than normal temperatures.  Discussed heat, AROM, tylenol 1000mg  po QID prn pain. Avoid weight gain Follow up if loss of bowel/bladder control, saddle paresthesias, arm/leg weakness same day.  Consider reimaging if no improvement in symptoms with plan of care.  Exitcare handout printed on pinched nerve, low back strain with rehab exercises DDD, paresthesias, zinc/B6/B12 supplements/food sources given to patient.  Discussed start with 3 reps hold for 5 seconds and increase to 5 reps next week.  Do each exercise once per day does not have to be all exercises at one time.  Better if spread out.  Apply coconut oil more frequently to feet as still somewhat dry skin, burning could be fungal/tinea infection also DDx but hyperpigmentation not over area patient points too but opposite side of foot.  Patient verbalized understanding information/instructions, agreed with plan of care and had no further questions at this time.

## 2017-10-24 ENCOUNTER — Ambulatory Visit: Payer: PRIVATE HEALTH INSURANCE | Admitting: Family Medicine

## 2017-10-24 ENCOUNTER — Encounter: Payer: Self-pay | Admitting: Family Medicine

## 2017-10-24 VITALS — BP 127/89 | HR 71 | Temp 98.3°F | Resp 16 | Ht 66.0 in | Wt 160.0 lb

## 2017-10-24 DIAGNOSIS — M25649 Stiffness of unspecified hand, not elsewhere classified: Secondary | ICD-10-CM | POA: Diagnosis not present

## 2017-10-24 DIAGNOSIS — R7303 Prediabetes: Secondary | ICD-10-CM

## 2017-10-24 NOTE — Progress Notes (Signed)
   3/9/20199:36 AM  [place] 08-13-1966, 52 y.o. male [id number]  Chief Complaint  Patient presents with  . Numbness    In fingers of left hand, describes as stiff when sleeping on left side  . Hyperglycemia    Patient wouldl like glucose level checked today    HPI:   Patient is a 52 y.o. male with past medical history significant for pre-diabetes who presents today for  Concerns of waking up with 4th and 5th fingers flexed and stiff. He states that it only happens if he sleeps on his side and it will affect the hand under him. If he sleeps on his back then it does not happen. He denies any pain, numbness, tingling. He denies any symptoms during the day. This has been going for several month. He denies any neck pain.   He is also requesting glucose check, he is a pre-diabetic. Last a1c was 5.9 in 03/2017. He tries to follow a healthy diet.   Depression screen [facility] 2/9 10/24/2017 04/04/2017 11/08/2016  Decreased Interest 0 0 0  Down, Depressed, Hopeless 0 0 0  PHQ - 2 Score 0 0 0    No Known Allergies  Prior to Admission medications   Medication Sig Start Date End Date Taking? Authorizing Provider  tenofovir (VIREAD) 300 MG tablet Take 300 mg by mouth daily. Reported on 01/11/2016   Yes [provider]    Past Medical History:  Diagnosis Date  . Allergy   . GERD (gastroesophageal reflux disease)   . Hepatitis B   . Hyperlipidemia   . Prediabetes     Past Surgical History:  Procedure Laterality Date  . NO PAST SURGERIES      Social History   Tobacco Use  . Smoking status: Never Smoker  . Smokeless tobacco: Never Used  Substance Use Topics  . Alcohol use: No    Alcohol/week: 0.0 oz    History reviewed. No pertinent family history.  ROS Per hpi  OBJECTIVE:  Blood pressure 127/89, pulse 71, temperature 98.3 F (36.8 C), temperature source Oral, resp. rate 16, height 5\' 6"  (1.676 m), weight 160 lb (72.6 kg), SpO2 97 %.  Physical Exam  Gen: AAOx3,  NAD Neck; FROM, non tender CV: RRR no m/r/g Pulm: CTAB no w/r/r MSK: Shoulder, Elbows, wrists and finger FROM, strength, reflexes and sensation intact. Negative phalen, tinel at elbow and elbow flexion test.    ASSESSMENT and PLAN  1. Prediabetes At goal, cont with LFM - Hemoglobin A1c  2. Stiffness of finger joint, unspecified laterality Per history suggestive of ulnar neuropathy triggered by sleeping with bent elbow. Exam normal. Discussed avoidance of activities that cause symptoms.  Return in about 6 months (around 04/26/2018) for annual physical exam.    Rutherford Guys, MD [facility] [address], [place] [address] Ph.  [phone] Fax [phone]

## 2017-10-24 NOTE — Patient Instructions (Addendum)
   IF you received an x-ray today, you will receive an invoice from [facility]. Please contact [facility] at [phone] with questions or concerns regarding your invoice.   IF you received labwork today, you will receive an invoice from [facility]. Please contact [facility] at [phone] with questions or concerns regarding your invoice.   Our billing staff will not be able to assist you with questions regarding bills from these companies.  You will be contacted with the lab results as soon as they are available. The fastest way to get your results is to activate your My Chart account. Instructions are located on the last page of this paperwork. If you have not heard from us regarding the results in 2 weeks, please contact this office.    Prediabetes Eating Plan Prediabetes-also called impaired glucose tolerance or impaired fasting glucose-is a condition that causes blood sugar (blood glucose) levels to be higher than normal. Following a healthy diet can help to keep prediabetes under control. It can also help to lower the risk of type 2 diabetes and heart disease, which are increased in people who have prediabetes. Along with regular exercise, a healthy diet:  Promotes weight loss.  Helps to control blood sugar levels.  Helps to improve the way that the body uses insulin.  What do I need to know about this eating plan?  Use the glycemic index (GI) to plan your meals. The index tells you how quickly a food will raise your blood sugar. Choose low-GI foods. These foods take a longer time to raise blood sugar.  Pay close attention to the amount of carbohydrates in the food that you eat. Carbohydrates increase blood sugar levels.  Keep track of how many calories you take in. Eating the right amount of calories will help you to achieve a healthy weight. Losing about 7 percent of your starting weight can help to prevent type 2 diabetes.  You may want to follow a  Mediterranean diet. This diet includes a lot of vegetables, lean meats or fish, whole grains, fruits, and healthy oils and fats. What foods can I eat? Grains Whole grains, such as whole-wheat or whole-grain breads, crackers, cereals, and pasta. Unsweetened oatmeal. Bulgur. Barley. Quinoa. Brown rice. Corn or whole-wheat flour tortillas or taco shells. Vegetables Lettuce. Spinach. Peas. Beets. Cauliflower. Cabbage. Broccoli. Carrots. Tomatoes. Squash. Eggplant. Herbs. Peppers. Onions. Cucumbers. Brussels sprouts. Fruits Berries. Bananas. Apples. Oranges. Grapes. Papaya. Mango. Pomegranate. Kiwi. Grapefruit. Cherries. Meats and Other Protein Sources Seafood. Lean meats, such as chicken and turkey or lean cuts of pork and beef. Tofu. Eggs. Nuts. Beans. Dairy Low-fat or fat-free dairy products, such as yogurt, cottage cheese, and cheese. Beverages Water. Tea. Coffee. Sugar-free or diet soda. Seltzer water. Milk. Milk alternatives, such as soy or almond milk. Condiments Mustard. Relish. Low-fat, low-sugar ketchup. Low-fat, low-sugar barbecue sauce. Low-fat or fat-free mayonnaise. Sweets and Desserts Sugar-free or low-fat pudding. Sugar-free or low-fat ice cream and other frozen treats. Fats and Oils Avocado. Walnuts. Olive oil. The items listed above may not be a complete list of recommended foods or beverages. Contact your dietitian for more options. What foods are not recommended? Grains Refined white flour and flour products, such as bread, pasta, snack foods, and cereals. Beverages Sweetened drinks, such as sweet iced tea and soda. Sweets and Desserts Baked goods, such as cake, cupcakes, pastries, cookies, and cheesecake. The items listed above may not be a complete list of foods and beverages to avoid. Contact your dietitian for more information.   This information is not intended to replace advice given to you by your health care provider. Make sure you discuss any questions you have with  your health care provider. Document Released: 12/19/2014 Document Revised: 01/10/2016 Document Reviewed: 08/30/2014 Elsevier Interactive Patient Education  2017 Elsevier Inc.  

## 2017-10-25 LAB — HEMOGLOBIN A1C
Est. average glucose Bld gHb Est-mCnc: 123 mg/dL
Hgb A1c MFr Bld: 5.9 % — ABNORMAL HIGH (ref 4.8–5.6)

## 2017-11-03 ENCOUNTER — Ambulatory Visit: Payer: PRIVATE HEALTH INSURANCE | Admitting: Emergency Medicine

## 2017-11-03 ENCOUNTER — Encounter: Payer: Self-pay | Admitting: Emergency Medicine

## 2017-11-03 VITALS — BP 142/82 | HR 75 | Temp 98.6°F | Resp 17 | Ht 67.5 in | Wt 162.0 lb

## 2017-11-03 DIAGNOSIS — M5432 Sciatica, left side: Secondary | ICD-10-CM | POA: Diagnosis not present

## 2017-11-03 DIAGNOSIS — R202 Paresthesia of skin: Secondary | ICD-10-CM | POA: Diagnosis not present

## 2017-11-03 MED ORDER — CYCLOBENZAPRINE HCL 10 MG PO TABS: 10.0000 mg | ORAL_TABLET | Freq: Three times a day (TID) | ORAL | 0 refills | Status: DC | PRN

## 2017-11-03 NOTE — Patient Instructions (Addendum)
IF you received an x-ray today, you will receive an invoice from [facility]. Please contact [facility] at [phone] with questions or concerns regarding your invoice.   IF you received labwork today, you will receive an invoice from [facility]. Please contact [facility] at [phone] with questions or concerns regarding your invoice.   Our billing staff will not be able to assist you with questions regarding bills from these companies.  You will be contacted with the lab results as soon as they are available. The fastest way to get your results is to activate your My Chart account. Instructions are located on the last page of this paperwork. If you have not heard from [place] regarding the results in 2 weeks, please contact this office.      Sciatica Sciatica is pain, numbness, weakness, or tingling along your sciatic nerve. The sciatic nerve starts in the lower back and goes down the back of each leg. Sciatica happens when this nerve is pinched or has pressure put on it. Sciatica usually goes away on its own or with treatment. Sometimes, sciatica may keep coming back (recur). Follow these instructions at home: Medicines  Take over-the-counter and prescription medicines only as told by your doctor.  Do not drive or use heavy machinery while taking prescription pain medicine. Managing pain  If directed, put ice on the affected area. ? Put ice in a plastic bag. ? Place a towel between your skin and the bag. ? Leave the ice on for 20 minutes, 2-3 times a day.  After icing, apply heat to the affected area before you exercise or as often as told by your doctor. Use the heat source that your doctor tells you to use, such as a moist heat pack or a heating pad. ? Place a towel between your skin and the heat source. ? Leave the heat on for 20-30 minutes. ? Remove the heat if your skin turns bright red. This is especially important if you are unable to feel pain, heat, or  cold. You may have a greater risk of getting burned. Activity  Return to your normal activities as told by your doctor. Ask your doctor what activities are safe for you. ? Avoid activities that make your sciatica worse.  Take short rests during the day. Rest in a lying or standing position. This is usually better than sitting to rest. ? When you rest for a long time, do some physical activity or stretching between periods of rest. ? Avoid sitting for a long time without moving. Get up and move around at least one time each hour.  Exercise and stretch regularly, as told by your doctor.  Do not lift anything that is heavier than 10 lb (4.5 kg) while you have symptoms of sciatica. ? Avoid lifting heavy things even when you do not have symptoms. ? Avoid lifting heavy things over and over.  When you lift objects, always lift in a way that is safe for your body. To do this, you should: ? Bend your knees. ? Keep the object close to your body. ? Avoid twisting. General instructions  Use good posture. ? Avoid leaning forward when you are sitting. ? Avoid hunching over when you are standing.  Stay at a healthy weight.  Wear comfortable shoes that support your feet. Avoid wearing high heels.  Avoid sleeping on a mattress that is too soft or too hard. You might have less pain if you sleep on a mattress that is firm  enough to support your back.  Keep all follow-up visits as told by your doctor. This is important. Contact a doctor if:  You have pain that: ? Wakes you up when you are sleeping. ? Gets worse when you lie down. ? Is worse than the pain you have had in the past. ? Lasts longer than 4 weeks.  You lose weight for without trying. Get help right away if:  You cannot control when you pee (urinate) or poop (have a bowel movement).  You have weakness in any of these areas and it gets worse. ? Lower back. ? Lower belly (pelvis). ? Butt (buttocks). ? Legs.  You have redness  or swelling of your back.  You have a burning feeling when you pee. This information is not intended to replace advice given to you by your health care provider. Make sure you discuss any questions you have with your health care provider. Document Released: 05/13/2008 Document Revised: 01/10/2016 Document Reviewed: 04/13/2015 Elsevier Interactive Patient Education  2018 [employer].  Paresthesia Paresthesia is a burning or prickling feeling. This feeling can happen in any part of the body. It often happens in the hands, arms, legs, or feet. Usually, it is not painful. In most cases, the feeling goes away in a short time and is not a sign of a serious problem. Follow these instructions at home:  Avoid drinking alcohol.  Try massage or needle therapy (acupuncture) to help with your problems.  Keep all follow-up visits as told by your doctor. This is important. Contact a doctor if:  You keep on having episodes of paresthesia.  Your burning or prickling feeling gets worse when you walk.  You have pain or cramps.  You feel dizzy.  You have a rash. Get help right away if:  You feel weak.  You have trouble walking or moving.  You have problems speaking, understanding, or seeing.  You feel confused.  You cannot control when you pee (urinate) or poop (bowel movement).  You lose feeling (numbness) after an injury.  You pass out (faint). This information is not intended to replace advice given to you by your health care provider. Make sure you discuss any questions you have with your health care provider. Document Released: 07/17/2008 Document Revised: 01/10/2016 Document Reviewed: 07/31/2014 Elsevier Interactive Patient Education  [employer].

## 2017-11-03 NOTE — Progress Notes (Signed)
Grant Wilson 52 y.o.   Chief Complaint  Patient presents with  . burning in hands and feet    HISTORY OF PRESENT ILLNESS: This is a 52 y.o. male complaining of intermittent pains to his left foot along with tingling in some of the toes.  At times he wakes up with pain to the left lumbar area that gets better with physical activity soon after getting up.  At times also feels numbness and tingling of his fingers with no particular triggering event.  No other significant symptoms.  Chronic problems.  Has been seen before in the ER and also at this office for same issues.  HPI   Prior to Admission medications   Medication Sig Start Date End Date Taking? Authorizing Provider  tenofovir (VIREAD) 300 MG tablet Take 300 mg by mouth daily. Reported on 01/11/2016   Yes [provider]    No Known Allergies  Patient Active Problem List   Diagnosis Date Noted  . Prediabetes 10/24/2017  . Abdominal pain, epigastric 11/08/2016  . Gastroesophageal reflux disease without esophagitis 11/08/2016  . DDD (degenerative disc disease), lumbar 01/11/2016  . Chronic low back pain 07/22/2012  . Elevated cholesterol 05/12/2012  . Seasonal allergies 05/12/2012  . Hepatitis B 09/22/2011    Past Medical History:  Diagnosis Date  . Allergy   . GERD (gastroesophageal reflux disease)   . Hepatitis B   . Hyperlipidemia   . Prediabetes     Past Surgical History:  Procedure Laterality Date  . NO PAST SURGERIES      Social History   Socioeconomic History  . Marital status: Single    Spouse name: n/a  . Number of children: 0  . Years of education: associates  . Highest education level: Not on file  Social Needs  . Financial resource strain: Not on file  . Food insecurity - worry: Not on file  . Food insecurity - inability: Not on file  . Transportation needs - medical: Not on file  . Transportation needs - non-medical: Not on file  Occupational History  . Occupation: IT department     Comment: [employer]  Tobacco Use  . Smoking status: Never Smoker  . Smokeless tobacco: Never Used  Substance and Sexual Activity  . Alcohol use: No    Alcohol/week: 0.0 oz  . Drug use: No  . Sexual activity: Yes    Partners: Female    Birth control/protection: Condom  Other Topics Concern  . Not on file  Social History Narrative   From [place]. Came to the [place] in 1997.   Lives alone.   No children    No family history on file.   Review of Systems  Constitutional: Negative.  Negative for chills, fever and weight loss.  HENT: Negative.  Negative for congestion, nosebleeds and sore throat.   Eyes: Negative.  Negative for blurred vision and double vision.  Respiratory: Negative.  Negative for cough and shortness of breath.   Cardiovascular: Negative.  Negative for chest pain, palpitations and leg swelling.  Gastrointestinal: Negative.  Negative for abdominal pain, diarrhea, nausea and vomiting.  Genitourinary: Negative.  Negative for dysuria and hematuria.  Musculoskeletal: Positive for back pain. Negative for myalgias and neck pain.  Skin: Negative.   Neurological: Positive for sensory change. Negative for dizziness, focal weakness, seizures, loss of consciousness and headaches.  Endo/Heme/Allergies: Negative.   All other systems reviewed and are negative.   Vitals:   11/03/17 1736  BP: (!) 142/82  Pulse:  75  Resp: 17  Temp: 98.6 F (37 C)  SpO2: 98%    Physical Exam  Constitutional: He is oriented to person, place, and time. He appears well-developed and well-nourished.  HENT:  Head: Normocephalic and atraumatic.  Nose: Nose normal.  Mouth/Throat: Oropharynx is clear and moist.  Eyes: Conjunctivae and EOM are normal. Pupils are equal, round, and reactive to light.  Neck: Normal range of motion. Neck supple. No thyromegaly present.  Cardiovascular: Normal rate, regular rhythm and normal heart sounds.  Pulmonary/Chest: Effort normal and breath sounds normal.    Abdominal: Soft. He exhibits no distension. There is no tenderness.  Musculoskeletal: Normal range of motion. He exhibits no edema or tenderness.  Lower extremities:NVI.  Full range of motion.  No erythema or bruising.  No tenderness.  Within normal limits.  Lymphadenopathy:    He has no cervical adenopathy.  Neurological: He is alert and oriented to person, place, and time. He displays normal reflexes. No cranial nerve deficit or sensory deficit. He exhibits normal muscle tone. Coordination normal.  Skin: Skin is warm and dry. Capillary refill takes less than 2 seconds. No rash noted.  Psychiatric: He has a normal mood and affect.  Vitals reviewed.    ASSESSMENT & PLAN: Grant Wilson was seen today for burning in hands and feet.  Diagnoses and all orders for this visit:  Paresthesia  Sciatica of left side -     cyclobenzaprine (FLEXERIL) 10 MG tablet; Take 1 tablet (10 mg total) by mouth 3 (three) times daily as needed for muscle spasms.   A total of 25 minutes was spent in the room with the patient, greater than 50% of which was in counseling/coordination of care.  Patient Instructions       IF you received an x-ray today, you will receive an invoice from [facility]. Please contact [facility] at [phone] with questions or concerns regarding your invoice.   IF you received labwork today, you will receive an invoice from [place]. Please contact [facility] at [phone] with questions or concerns regarding your invoice.   Our billing staff will not be able to assist you with questions regarding bills from these companies.  You will be contacted with the lab results as soon as they are available. The fastest way to get your results is to activate your My Chart account. Instructions are located on the last page of this paperwork. If you have not heard from [place] regarding the results in 2 weeks, please contact this office.      Sciatica Sciatica is pain,  numbness, weakness, or tingling along your sciatic nerve. The sciatic nerve starts in the lower back and goes down the back of each leg. Sciatica happens when this nerve is pinched or has pressure put on it. Sciatica usually goes away on its own or with treatment. Sometimes, sciatica may keep coming back (recur). Follow these instructions at home: Medicines  Take over-the-counter and prescription medicines only as told by your doctor.  Do not drive or use heavy machinery while taking prescription pain medicine. Managing pain  If directed, put ice on the affected area. ? Put ice in a plastic bag. ? Place a towel between your skin and the bag. ? Leave the ice on for 20 minutes, 2-3 times a day.  After icing, apply heat to the affected area before you exercise or as often as told by your doctor. Use the heat source that your doctor tells you to use, such as a moist heat  pack or a heating pad. ? Place a towel between your skin and the heat source. ? Leave the heat on for 20-30 minutes. ? Remove the heat if your skin turns bright red. This is especially important if you are unable to feel pain, heat, or cold. You may have a greater risk of getting burned. Activity  Return to your normal activities as told by your doctor. Ask your doctor what activities are safe for you. ? Avoid activities that make your sciatica worse.  Take short rests during the day. Rest in a lying or standing position. This is usually better than sitting to rest. ? When you rest for a long time, do some physical activity or stretching between periods of rest. ? Avoid sitting for a long time without moving. Get up and move around at least one time each hour.  Exercise and stretch regularly, as told by your doctor.  Do not lift anything that is heavier than 10 lb (4.5 kg) while you have symptoms of sciatica. ? Avoid lifting heavy things even when you do not have symptoms. ? Avoid lifting heavy things over and  over.  When you lift objects, always lift in a way that is safe for your body. To do this, you should: ? Bend your knees. ? Keep the object close to your body. ? Avoid twisting. General instructions  Use good posture. ? Avoid leaning forward when you are sitting. ? Avoid hunching over when you are standing.  Stay at a healthy weight.  Wear comfortable shoes that support your feet. Avoid wearing high heels.  Avoid sleeping on a mattress that is too soft or too hard. You might have less pain if you sleep on a mattress that is firm enough to support your back.  Keep all follow-up visits as told by your doctor. This is important. Contact a doctor if:  You have pain that: ? Wakes you up when you are sleeping. ? Gets worse when you lie down. ? Is worse than the pain you have had in the past. ? Lasts longer than 4 weeks.  You lose weight for without trying. Get help right away if:  You cannot control when you pee (urinate) or poop (have a bowel movement).  You have weakness in any of these areas and it gets worse. ? Lower back. ? Lower belly (pelvis). ? Butt (buttocks). ? Legs.  You have redness or swelling of your back.  You have a burning feeling when you pee. This information is not intended to replace advice given to you by your health care provider. Make sure you discuss any questions you have with your health care provider. Document Released: 05/13/2008 Document Revised: 01/10/2016 Document Reviewed: 04/13/2015 Elsevier Interactive Patient Education  2018 [employer].  Paresthesia Paresthesia is a burning or prickling feeling. This feeling can happen in any part of the body. It often happens in the hands, arms, legs, or feet. Usually, it is not painful. In most cases, the feeling goes away in a short time and is not a sign of a serious problem. Follow these instructions at home:  Avoid drinking alcohol.  Try massage or needle therapy (acupuncture) to help with your  problems.  Keep all follow-up visits as told by your doctor. This is important. Contact a doctor if:  You keep on having episodes of paresthesia.  Your burning or prickling feeling gets worse when you walk.  You have pain or cramps.  You feel dizzy.  You have a  rash. Get help right away if:  You feel weak.  You have trouble walking or moving.  You have problems speaking, understanding, or seeing.  You feel confused.  You cannot control when you pee (urinate) or poop (bowel movement).  You lose feeling (numbness) after an injury.  You pass out (faint). This information is not intended to replace advice given to you by your health care provider. Make sure you discuss any questions you have with your health care provider. Document Released: 07/17/2008 Document Revised: 01/10/2016 Document Reviewed: 07/31/2014 Elsevier Interactive Patient Education  2018 Elsevier Inc.      Agustina Caroli, MD [facility]

## 2017-12-14 IMAGING — US US ABDOMEN COMPLETE
1 series · 14 of 25 positions shown · non-contrast
Comparison: 01/13/2014

CLINICAL DATA: Epigastric abdominal pain x1 month

EXAM:
ABDOMEN ULTRASOUND COMPLETE

[Series 1: us abdomen complete · 0.24mm/px · 14 of 90 slices shown]
[im 1/90]
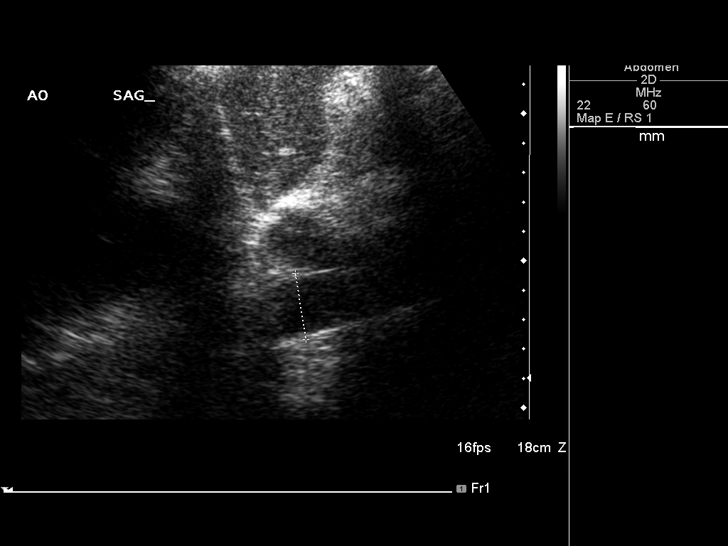
[im 8/90]
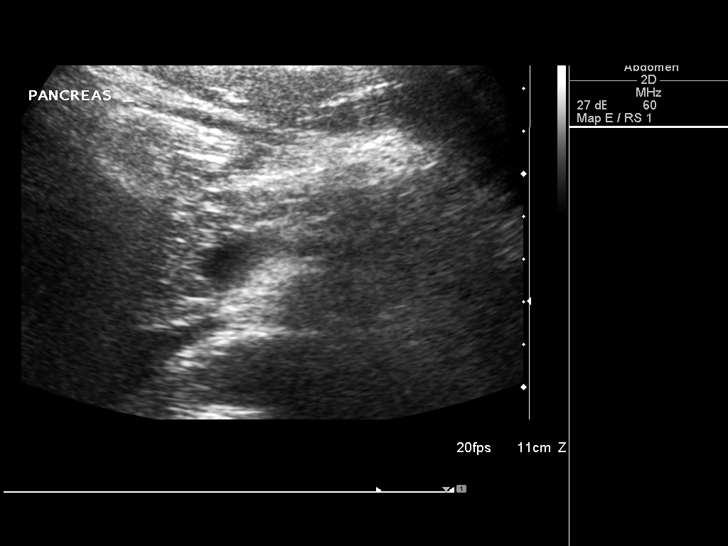
[im 15/90]
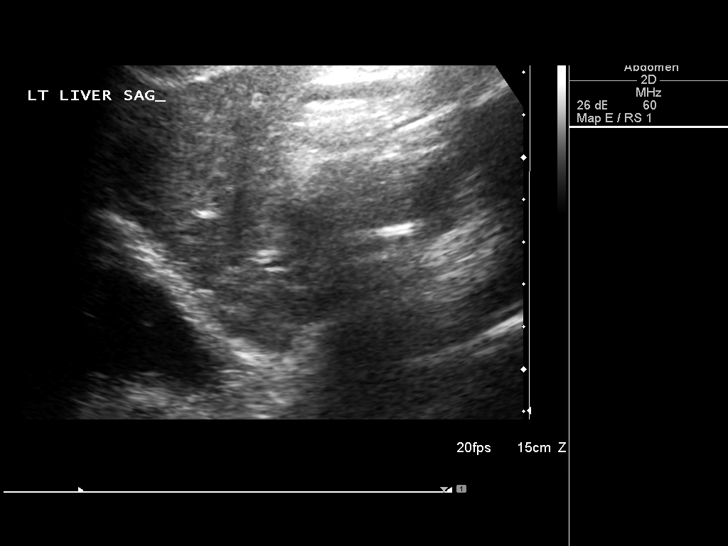
[im 23/90]
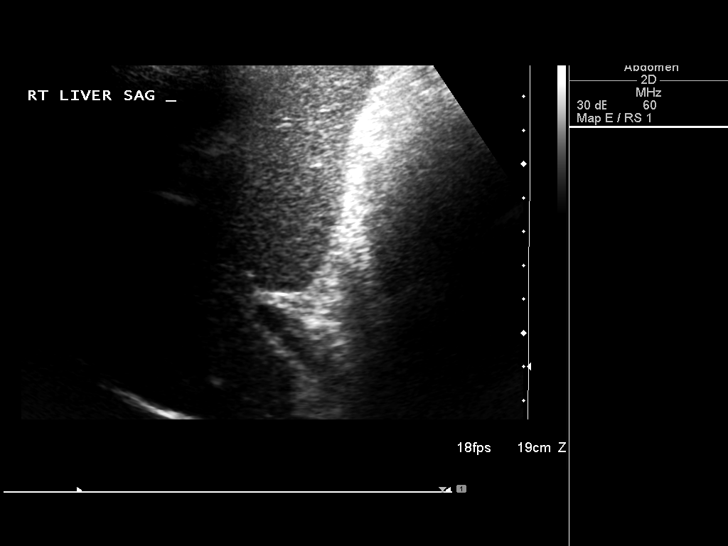
[im 30/90]
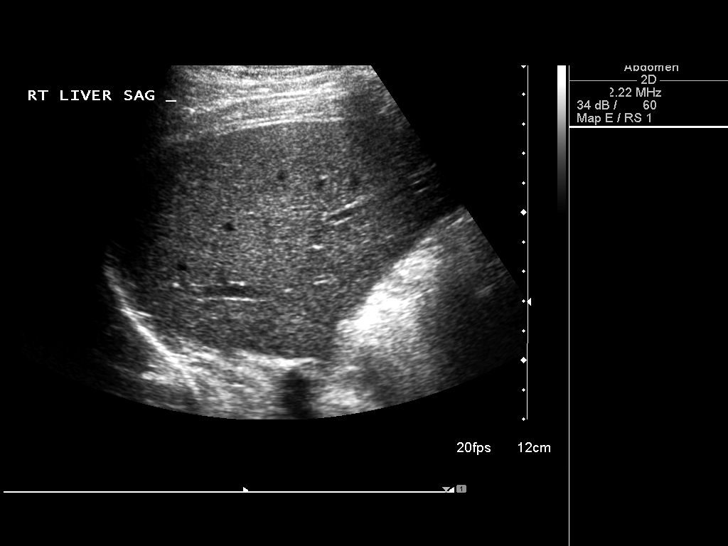
[im 34/90]
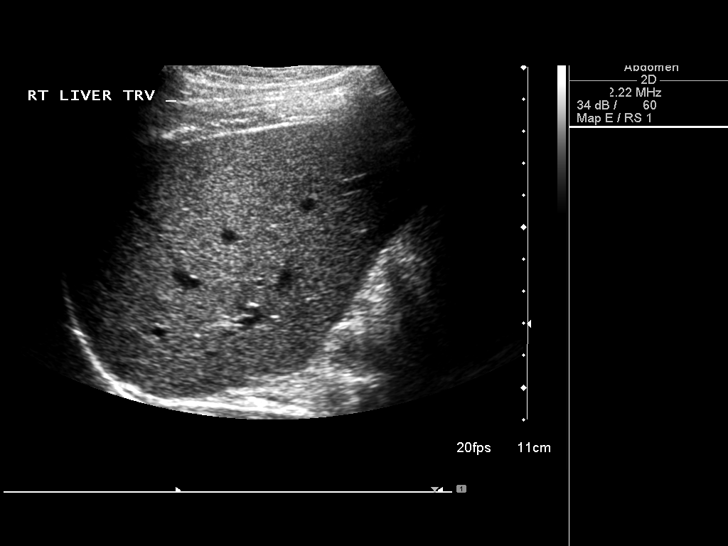
[im 41/90]
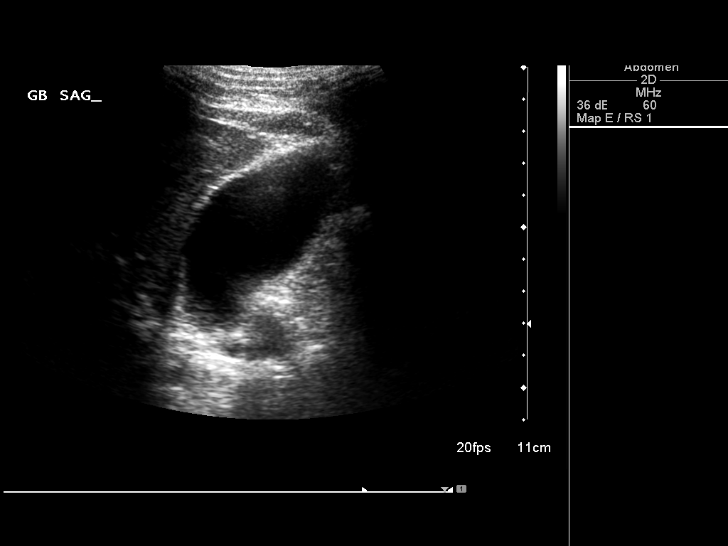
[im 49/90]
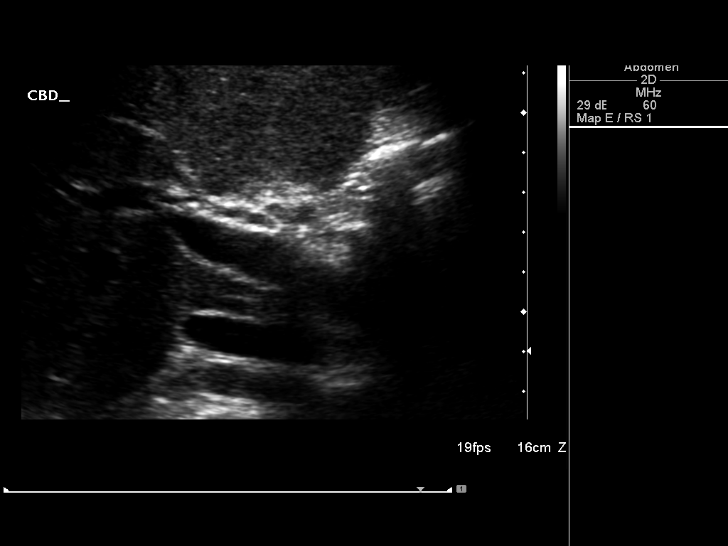
[im 56/90]
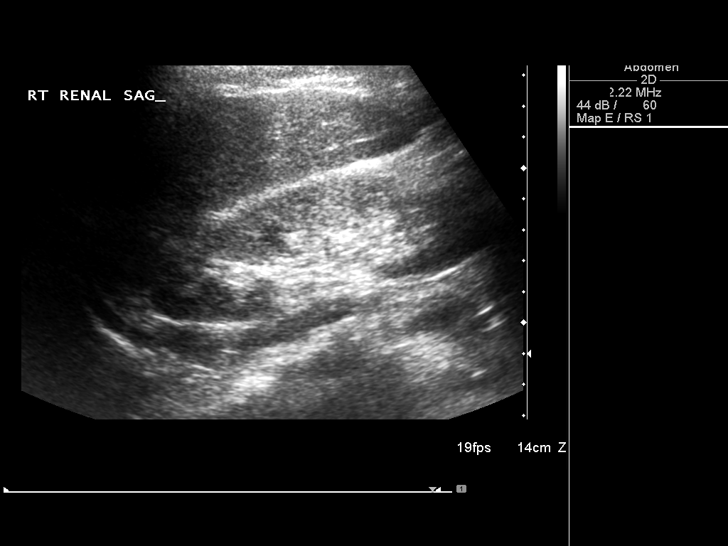
[im 60/90]
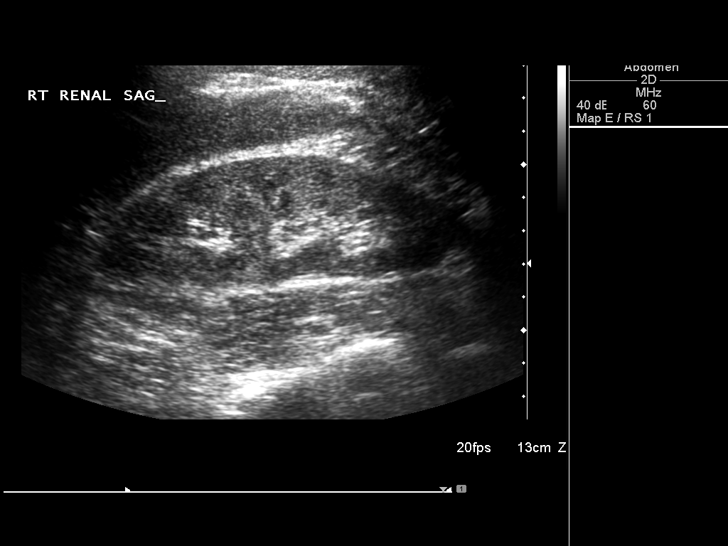
[im 67/90]
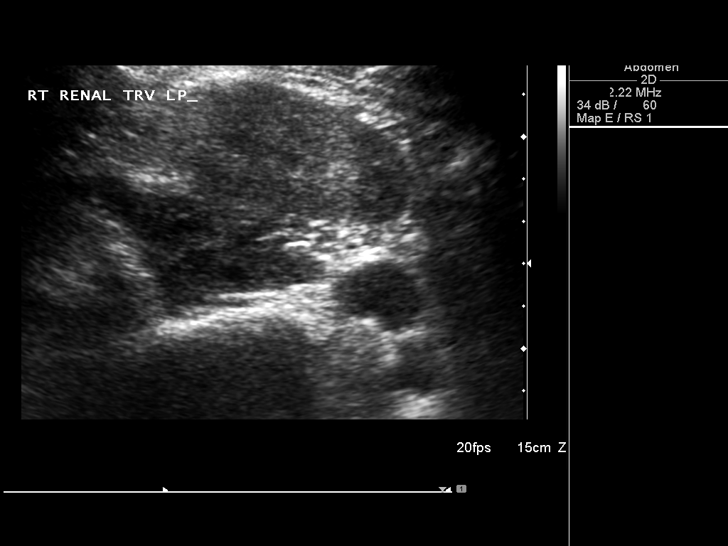
[im 75/90]
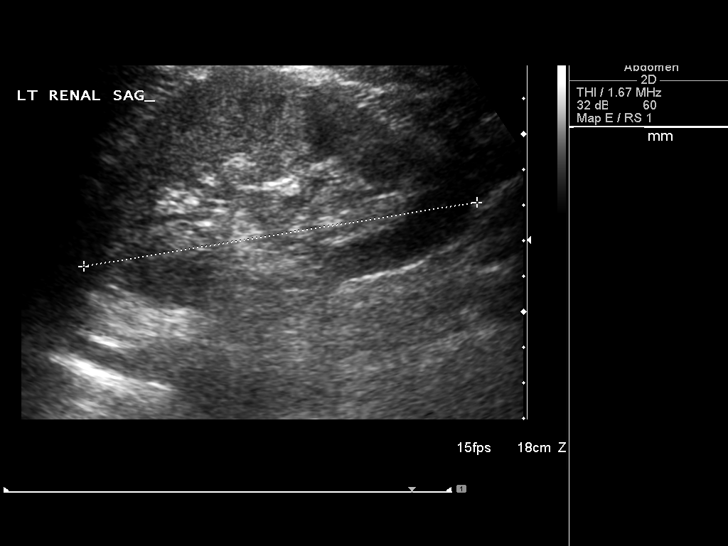
[im 82/90]
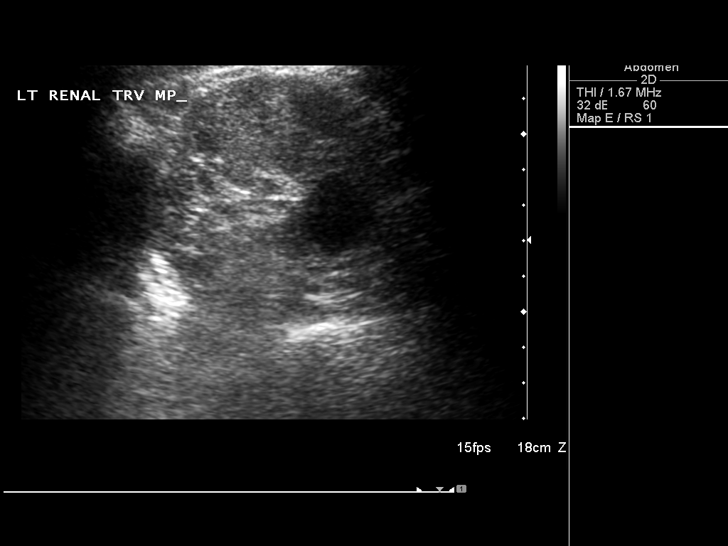
[im 90/90]
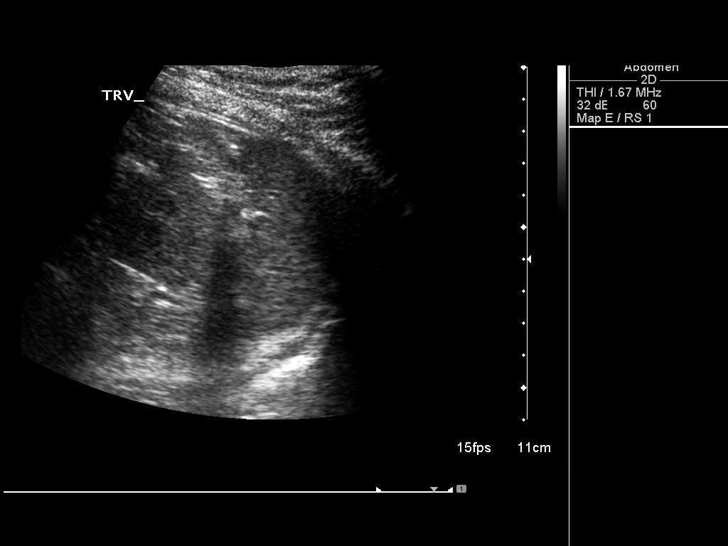

[14 of 25 positions shown; findings below may reference images not displayed]

FINDINGS: Gallbladder: No gallstones, gallbladder wall thickening, or
pericholecystic fluid. Negative sonographic Murphy's sign.

Common bile duct: Diameter: 3 mm

Liver: 15 x 8 x 8 mm hyperechoic lesion in the left hepatic lobe,
likely reflecting a benign hemangioma, similar to 5198.

IVC: No abnormality visualized.

Pancreas: Visualized portion unremarkable.

Spleen: Size and appearance within normal limits.

Right Kidney: Length: 11.9 cm.  No mass or hydronephrosis.

Left Kidney: Length: 11.2 cm. 2.4 x 2.5 x 1.8 cm upper pole simple
cyst. No hydronephrosis.

Abdominal aorta: No aneurysm visualized.

Other findings: None.
IMPRESSION: 1.5 cm hyperechoic lesion in the left hepatic lobe, likely
reflecting a benign hemangioma.

2.5 cm simple left upper pole renal cysts.  No hydronephrosis.

## 2018-10-12 ENCOUNTER — Encounter: Payer: Self-pay | Admitting: Registered Nurse

## 2018-10-12 ENCOUNTER — Ambulatory Visit: Payer: Self-pay | Admitting: Registered Nurse

## 2018-10-12 VITALS — BP 154/84 | HR 96 | Temp 97.6°F

## 2018-10-12 DIAGNOSIS — M545 Low back pain, unspecified: Secondary | ICD-10-CM

## 2018-10-12 DIAGNOSIS — S29019A Strain of muscle and tendon of unspecified wall of thorax, initial encounter: Secondary | ICD-10-CM

## 2018-10-12 NOTE — Progress Notes (Signed)
Subjective:    Patient ID: Grant Wilson, male    DOB: 04-Aug-1966, 53 y.o.   MRN: [id number]  52y/o African American established male pt c/o L sided mid to low back pain x3 days. Pain now consistently improving since yesterday while using Biofreeze. Hx of sciatica. Pain was worse with just coughing 2 days ago, but now only bending over and lifting heavy objects is exacerbating the pain. Previously treated with Cyclobenzaprine last year after he had multiple flares. This time has only need heating pad, Biofreeze, and back stretches. No flares since March 2019.   Stated paresthesias hand/feet resolved that were assessed last year; this weekend left flank pain resolved with stretch but today left waist pain  Started; denied loss of bowel bladder control, saddle paresthesias or arm/leg weakness  Review of Systems  Constitutional: Negative for activity change, appetite change, chills, diaphoresis, fatigue and fever.  HENT: Negative for trouble swallowing and voice change.   Eyes: Negative for photophobia and visual disturbance.  Respiratory: Negative for cough, shortness of breath, wheezing and stridor.   Cardiovascular: Negative for chest pain.  Gastrointestinal: Negative for diarrhea, nausea and vomiting.  Endocrine: Negative for cold intolerance and heat intolerance.  Genitourinary: Positive for flank pain. Negative for difficulty urinating, enuresis and frequency.  Musculoskeletal: Positive for back pain and myalgias. Negative for gait problem, joint swelling, neck pain and neck stiffness.  Skin: Negative for color change, pallor, rash and wound.  Allergic/Immunologic: Negative for food allergies.  Neurological: Negative for dizziness, tremors, seizures, syncope, facial asymmetry, speech difficulty, weakness, light-headedness, numbness and headaches.  Hematological: Negative for adenopathy. Does not bruise/bleed easily.  Psychiatric/Behavioral: Negative for agitation, confusion and sleep  disturbance.       Objective:   Physical Exam Vitals signs and nursing note reviewed.  Constitutional:      General: He is awake. He is not in acute distress.    Appearance: Normal appearance. He is well-developed and well-groomed. He is not ill-appearing, toxic-appearing or diaphoretic.  HENT:     Head: Normocephalic and atraumatic.     Right Ear: Hearing and external ear normal.     Left Ear: Hearing and external ear normal.     Nose: Nose normal. No congestion or rhinorrhea.     Mouth/Throat:     Mouth: Mucous membranes are moist.     Pharynx: Uvula midline. No oropharyngeal exudate.  Eyes:     General: Lids are normal. No visual field deficit or scleral icterus.       Right eye: No discharge.        Left eye: No discharge.     Extraocular Movements: Extraocular movements intact.     Conjunctiva/sclera: Conjunctivae normal.     Pupils: Pupils are equal, round, and reactive to light.  Neck:     Musculoskeletal: Neck supple. Decreased range of motion. No edema, erythema, neck rigidity, crepitus, pain with movement, torticollis, spinous process tenderness or muscular tenderness.     Trachea: Trachea and phonation normal.  Cardiovascular:     Rate and Rhythm: Normal rate and regular rhythm.     Pulses: Normal pulses.          Radial pulses are 2+ on the right side and 2+ on the left side.     Heart sounds: Normal heart sounds.  Pulmonary:     Effort: Pulmonary effort is normal. No respiratory distress.     Breath sounds: Normal breath sounds and air entry. No stridor, decreased air movement or  transmitted upper airway sounds. No decreased breath sounds, wheezing, rhonchi or rales.  Abdominal:     General: Abdomen is flat. There is no distension.     Palpations: Abdomen is soft.     Tenderness: There is no guarding.  Musculoskeletal:        General: Tenderness present. No swelling or deformity.     Right shoulder: Normal.     Left shoulder: Normal.     Right elbow: Normal.     Left elbow: Normal.     Right hip: Normal.     Left hip: Normal.     Right knee: Normal.     Left knee: Normal.     Right ankle: Normal.     Left ankle: Normal.     Cervical back: Normal. He exhibits normal range of motion, no tenderness, no bony tenderness, no swelling, no edema, no deformity, no laceration, no pain, no spasm and normal pulse.     Thoracic back: He exhibits decreased range of motion and pain. He exhibits no tenderness, no bony tenderness, no swelling, no edema, no deformity, no laceration, no spasm and normal pulse.     Lumbar back: He exhibits decreased range of motion and pain. He exhibits no tenderness, no bony tenderness, no swelling, no edema, no deformity, no laceration, no spasm and normal pulse.       Back:     Right hand: Normal.     Left hand: Normal.     Right lower leg: No edema.     Left lower leg: No edema.  Lymphadenopathy:     Head:     Right side of head: No submental, submandibular, tonsillar, preauricular, posterior auricular or occipital adenopathy.     Left side of head: No submental, submandibular, tonsillar, preauricular, posterior auricular or occipital adenopathy.     Cervical: No cervical adenopathy.     Right cervical: No superficial, deep or posterior cervical adenopathy.    Left cervical: No superficial, deep or posterior cervical adenopathy.  Skin:    General: Skin is warm and dry.     Capillary Refill: Capillary refill takes less than 2 seconds.     Coloration: Skin is not ashen, cyanotic, jaundiced, mottled, pale or sallow.     Findings: No abrasion, abscess, acne, bruising, burn, ecchymosis, erythema, laceration, lesion, petechiae, rash or wound.     Nails: There is no clubbing.   Neurological:     General: No focal deficit present.     Mental Status: He is alert and oriented to person, place, and time. Mental status is at baseline. He is not disoriented.     GCS: GCS eye subscore is 4. GCS verbal subscore is 5. GCS motor subscore  is 6.     Cranial Nerves: Cranial nerves are intact. No cranial nerve deficit, dysarthria or facial asymmetry.     Sensory: Sensation is intact. No sensory deficit.     Motor: Motor function is intact. No weakness, tremor, atrophy, abnormal muscle tone or seizure activity.     Coordination: Coordination is intact. Coordination normal.     Gait: Gait is intact. Gait normal.     Deep Tendon Reflexes: Reflexes normal.     Reflex Scores:      Brachioradialis reflexes are 2+ on the right side and 2+ on the left side.      Patellar reflexes are 3+ on the right side and 3+ on the left side.    Comments: Gait sure and steady hallway;  on/off exam table without difficulty and in/out of chair; smooth AROM and full  T/l/c-spine bilateral hand grasp and extremity strength 5/5 equal bilaterally  Psychiatric:        Mood and Affect: Mood normal.        Speech: Speech normal.        Behavior: Behavior normal. Behavior is cooperative.        Thought Content: Thought content normal.        Judgment: Judgment normal.     Feels pull left waistline with forward flexion; able to touch ankles; pull right waist line with left lateral bending and left waistline with right lateral bending; no pain with bilateral rotation or extension; not TTP no spasm palpated; SI joints non tender bilaterally and hips not TTP bilaterally      Assessment & Plan:  A-mid back strain and recurrent low back pain  P-was given flexeril 10mg  Mar 2019 will check at home thinks he still has pills but doesn't think he needs refill at this time.  Prefers biofreeze/thermacare and tylenol.  Discussed due to blood pressure I recommend tylenol over motrin/ibuprofen at this time.  Pain can raise blood pressure.   Refilled biofreeze gel apply topical QId prn pain given 4 UD from clinic stock  and given thermacare back XL from clinic stock and tylenol 1000mg  po QID 4 UD from clinic stock given to patient.  cyclobenazeprine/flexeril 10mg  po TID prn  muscle spasms  At home  Avoid alcohol intake and driving while taking cyclobenazeprine/flexeril as drowsiness common side effect.  Slow position changes as medication also lower blood pressure.  Home stretches demonstrated to patient-e.g. gentle thoracic/lumbar/ neck AROM,  knee to chest and rock side to side on back. Self massage or professional prn, foam roller use or tennis/racquetball.  Heat/cryotherapy 15 minutes QID prn.  Trial thermacare discussed lasts 8 hours  Consider physical therapy referral if no improvement with prescribed therapy from [facility] and/or chiropractic care.  Ensure ergonomics correct desk at work avoid repetitive motions if possible/holding phone/laptop in hand use desk/stand and/or break up lifting items into smaller loads/weights.  Patient was instructed to rest, ice, and ROM exercises.  Activity as tolerated. Reminded patient he has bone spurs lumbar spine and arthritis can be flared up by cold weather snowed last week Thursday and Friday.  Buddy lift if items weighing more than 40 lbs at work to prevent strain of muscles.    Follow up if symptoms persist or worsen especially if loss of bowel/bladder control, arm/leg weakness and/or saddle paresthesias.  Exitcare handout on mid low back pain, arthritis and rehab exercises.  Patient verbalized agreement and understanding of treatment plan and had no further questions at this time.  P2:  Injury Prevention and Fitness.  Follow up with PCM elevated BP; annual be well labs due next month to schedule with RN Hildred Alamin.  Patient verbalized understanding information/instructions and agreed with plan of care.

## 2018-10-12 NOTE — Patient Instructions (Addendum)
Low Back Strain Rehab Ask your health care provider which exercises are safe for you. Do exercises exactly as told by your health care provider and adjust them as directed. It is normal to feel mild stretching, pulling, tightness, or discomfort as you do these exercises, but you should stop right away if you feel sudden pain or your pain gets worse. Do not begin these exercises until told by your health care provider. Stretching and range of motion exercises These exercises warm up your muscles and joints and improve the movement and flexibility of your back. These exercises also help to relieve pain, numbness, and tingling. Exercise A: Single knee to chest  1. Lie on your back on a firm surface with both legs straight. 2. Bend one of your knees. Use your hands to move your knee up toward your chest until you feel a gentle stretch in your lower back and buttock. ? Hold your leg in this position by holding onto the front of your knee. ? Keep your other leg as straight as possible. 3. Hold for ____10______ seconds. 4. Slowly return to the starting position. 5. Repeat with your other leg. Repeat ______3____ times. Complete this exercise _____3_____ times a day. Exercise B: Prone extension on elbows  1. Lie on your abdomen on a firm surface. 2. Prop yourself up on your elbows. 3. Use your arms to help lift your chest up until you feel a gentle stretch in your abdomen and your lower back. ? This will place some of your body weight on your elbows. If this is uncomfortable, try stacking pillows under your chest. ? Your hips should stay down, against the surface that you are lying on. Keep your hip and back muscles relaxed. 4. Hold for ____15______ seconds. 5. Slowly relax your upper body and return to the starting position. Repeat _____3_____ times. Complete this exercise _______3___ times a day. Strengthening exercises These exercises build strength and endurance in your back. Endurance is the  ability to use your muscles for a long time, even after they get tired. Exercise C: Pelvic tilt 1. Lie on your back on a firm surface. Bend your knees and keep your feet flat. 2. Tense your abdominal muscles. Tip your pelvis up toward the ceiling and flatten your lower back into the floor. ? To help with this exercise, you may place a small towel under your lower back and try to push your back into the towel. 3. Hold for _______15___ seconds. 4. Let your muscles relax completely before you repeat this exercise. Repeat _____3_____ times. Complete this exercise ___3_______ times a day. Exercise D: Alternating arm and leg raises  1. Get on your hands and knees on a firm surface. If you are on a hard floor, you may want to use padding to cushion your knees, such as an exercise mat. 2. Line up your arms and legs. Your hands should be below your shoulders, and your knees should be below your hips. 3. Lift your left leg behind you. At the same time, raise your right arm and straighten it in front of you. ? Do not lift your leg higher than your hip. ? Do not lift your arm higher than your shoulder. ? Keep your abdominal and back muscles tight. ? Keep your hips facing the ground. ? Do not arch your back. ? Keep your balance carefully, and do not hold your breath. 4. Hold for _____15_____ seconds. 5. Slowly return to the starting position and repeat with your right leg  and your left arm. Repeat _____3_____ times. Complete this exercise _____3_____times a day. Exercise J: Single leg lower with bent knees 1. Lie on your back on a firm surface. 2. Tense your abdominal muscles and lift your feet off the floor, one foot at a time, so your knees and hips are bent in an "L" shape (at about 90 degrees). ? Your knees should be over your hips and your lower legs should be parallel to the floor. 3. Keeping your abdominal muscles tense and your knee bent, slowly lower one of your legs so your toe touches the  ground. 4. Lift your leg back up to return to the starting position. ? Do not hold your breath. ? Do not let your back arch. Keep your back flat against the ground. 5. Repeat with your other leg. Repeat _____3_____ times. Complete this exercise ____3______ times a day. Posture and body mechanics  Body mechanics refers to the movements and positions of your body while you do your daily activities. Posture is part of body mechanics. Good posture and healthy body mechanics can help to relieve stress in your body's tissues and joints. Good posture means that your spine is in its natural S-curve position (your spine is neutral), your shoulders are pulled back slightly, and your head is not tipped forward. The following are general guidelines for applying improved posture and body mechanics to your everyday activities. Standing   When standing, keep your spine neutral and your feet about hip-width apart. Keep a slight bend in your knees. Your ears, shoulders, and hips should line up.  When you do a task in which you stand in one place for a long time, place one foot up on a stable object that is 2-4 inches (5-10 cm) high, such as a footstool. This helps keep your spine neutral. Sitting   When sitting, keep your spine neutral and keep your feet flat on the floor. Use a footrest, if necessary, and keep your thighs parallel to the floor. Avoid rounding your shoulders, and avoid tilting your head forward.  When working at a desk or a computer, keep your desk at a height where your hands are slightly lower than your elbows. Slide your chair under your desk so you are close enough to maintain good posture.  When working at a computer, place your monitor at a height where you are looking straight ahead and you do not have to tilt your head forward or downward to look at the screen. Resting   When lying down and resting, avoid positions that are most painful for you.  If you have pain with activities  such as sitting, bending, stooping, or squatting (flexion-based activities), lie in a position in which your body does not bend very much. For example, avoid curling up on your side with your arms and knees near your chest (fetal position).  If you have pain with activities such as standing for a long time or reaching with your arms (extension-based activities), lie with your spine in a neutral position and bend your knees slightly. Try the following positions: ? Lying on your side with a pillow between your knees. ? Lying on your back with a pillow under your knees. Lifting   When lifting objects, keep your feet at least shoulder-width apart and tighten your abdominal muscles.  Bend your knees and hips and keep your spine neutral. It is important to lift using the strength of your legs, not your back. Do not lock your knees straight  out.  Always ask for help to lift heavy or awkward objects. This information is not intended to replace advice given to you by your health care provider. Make sure you discuss any questions you have with your health care provider. Document Released: 08/04/2005 Document Revised: 04/10/2016 Document Reviewed: 05/16/2015 Elsevier Interactive Patient Education  2019 [place]. Thoracic Strain  A thoracic strain, which is sometimes called a mid-back strain, is an injury to the muscles or tendons that attach to the upper part of your back behind your chest. This type of injury occurs when a muscle is overstretched or overloaded. Thoracic strains can range from mild to severe. Mild strains may involve stretching a muscle or tendon without tearing it. These injuries may heal in 1-2 weeks. More severe strains involve tearing of muscle fibers or tendons. These will cause more pain and may take 6-8 weeks to heal. What are the causes? This condition may be caused by:  An injury in which a sudden force is placed on the muscle.  Exercising without properly warming  up.  Overuse of the muscle.  Improper form during certain movements.  Other injuries that surround or cause stress on the mid-back, causing a strain on the muscles. In some cases, the cause may not be known. What increases the risk? This injury is more common in:  Athletes.  People with obesity. What are the signs or symptoms? The main symptom of this condition is pain, especially with movement. Other symptoms include:  Bruising.  Swelling.  Spasm. How is this diagnosed? This condition may be diagnosed with a physical exam. X-rays may be taken to check for a fracture. How is this treated? This condition may be treated with:  Resting and icing the injured area.  Physical therapy. This will involve doing stretching and strengthening exercises.  Medicines for pain and inflammation. Follow these instructions at home:  Rest as needed. Follow instructions from your health care provider about any restrictions on activity.  If directed, apply ice to the injured area: ? Put ice in a plastic bag. ? Place a towel between your skin and the bag. ? Leave the ice on for 20 minutes, 2-3 times per day.  Take over-the-counter and prescription medicines only as told by your health care provider.  Begin doing exercises as told by your health care provider or physical therapist.  Always warm up properly before physical activity or sports.  Bend your knees before you lift heavy objects.  Keep all follow-up visits as told by your health care provider. This is important. Contact a health care provider if:  Your pain is not helped by medicine.  Your pain, bruising, or swelling is getting worse.  You have a fever. Get help right away if:  You have shortness of breath.  You have chest pain.  You develop numbness or weakness in your legs.  You have involuntary loss of urine (urinary incontinence). This information is not intended to replace advice given to you by your health  care provider. Make sure you discuss any questions you have with your health care provider. Document Released: 10/25/2003 Document Revised: 04/05/2016 Document Reviewed: 09/28/2014 Elsevier Interactive Patient Education  2019 [place]. Mid-Back Strain Rehab Ask your health care provider which exercises are safe for you. Do exercises exactly as told by your health care provider and adjust them as directed. It is normal to feel mild stretching, pulling, tightness, or discomfort as you do these exercises, but you should stop right away if you  feel sudden pain or your pain gets worse. Do not begin these exercises until told by your health care provider. Stretching and range of motion exercises This exercise warms up your muscles and joints and improves the movement and flexibility of your back and shoulders. This exercise also help to relieve pain. Exercise A: Chest and spine stretch  1. Lie down on your back on a firm surface. 2. Roll a towel or a small blanket so it is about 4 inches (10 cm) in diameter. 3. Put the towel lengthwise under the middle of your back so it is under your spine, but not under your shoulder blades. 4. To increase the stretch, you may put your hands behind your head and let your elbows fall to your sides. 5. Hold for ____15______ seconds. Repeat exercise ____3______ times. Complete this exercise _____3_____ times a day. Strengthening exercises These exercises build strength and endurance in your back and your shoulder blade muscles. Endurance is the ability to use your muscles for a long time, even after they get tired. Exercise B: Alternating arm and leg raises  6. Get on your hands and knees on a firm surface. If you are on a hard floor, you may want to use padding to cushion your knees, such as an exercise mat. 7. Line up your arms and legs. Your hands should be below your shoulders, and your knees should be below your hips. 8. Lift your left leg behind you. At the  same time, raise your right arm and straighten it in front of you. ? Do not lift your leg higher than your hip. ? Do not lift your arm higher than your shoulder. ? Keep your abdominal and back muscles tight. ? Keep your hips facing the ground. ? Do not arch your back. ? Keep your balance carefully, and do not hold your breath. 9. Hold for _____15_____ seconds. 10. Slowly return to the starting position and repeat with your right leg and your left arm. Repeat ______3____ times. Complete this exercise ____3______ times a day. Exercise C: Straight arm rows (shoulder extension)  1. Stand with your feet shoulder width apart. 2. Secure an exercise band to a stable object in front of you so the band is at or above shoulder height. 3. Hold one end of the exercise band in each hand. 4. Straighten your elbows and lift your hands up to shoulder height. 5. Step back, away from the secured end of the exercise band, until the band stretches. 6. Squeeze your shoulder blades together and pull your hands down to the sides of your thighs. Stop when your hands are straight down by your sides. Do not let your hands go behind your body. 7. Hold for ___15_______ seconds. 8. Slowly return to the starting position. Repeat _____3_____ times. Complete this exercise ______3____ times a day. Exercise D: Shoulder external rotation, prone 6. Lie on your abdomen on a firm bed so your left / right forearm hangs over the edge of the bed and your upper arm is on the bed, straight out from your body. ? Your elbow should be bent. ? Your palm should be facing your feet. 7. If instructed, hold a ____none_____ weight in your hand. 8. Squeeze your shoulder blade toward the middle of your back. Do not let your shoulder lift toward your ear. 9. Keep your elbow bent in an "L" shape (90 degrees) while you slowly move your forearm up toward the ceiling. Move your forearm up to the height of the  bed, toward your head. ? Your upper  arm should not move. ? At the top of the movement, your palm should face the floor. 10. Hold for ________15__ seconds. 11. Slowly return to the starting position and relax your muscles. Repeat ______3____ times. Complete this exercise __3________ times a day. Exercise E: Scapular retraction and external rotation, rowing  1. Sit in a stable chair without armrests, or stand. 2. Secure an exercise band to a stable object in front of you so it is at shoulder height. 3. Hold one end of the exercise band in each hand. 4. Bring your arms out straight in front of you. 5. Step back, away from the secured end of the exercise band, until the band stretches. 6. Pull the band backward. As you do this, bend your elbows and squeeze your shoulder blades together, but avoid letting the rest of your body move. Do not let your shoulders lift up toward your ears. 7. Stop when your elbows are at your sides or slightly behind your body. 8. Hold for _____15_____ seconds. 9. Slowly straighten your arms to return to the starting position. Repeat _____3___ times. Complete this exercise ___3______ times a day. Posture and body mechanics  Body mechanics refers to the movements and positions of your body while you do your daily activities. Posture is part of body mechanics. Good posture and healthy body mechanics can help to relieve stress in your body's tissues and joints. Good posture means that your spine is in its natural S-curve position (your spine is neutral), your shoulders are pulled back slightly, and your head is not tipped forward. The following are general guidelines for applying improved posture and body mechanics to your everyday activities. Standing   When standing, keep your spine neutral and your feet about hip-width apart. Keep a slight bend in your knees. Your ears, shoulders, and hips should line up.  When you do a task in which you lean forward while standing in one place for a long time, place one  foot up on a stable object that is 2-4 inches (5-10 cm) high, such as a footstool. This helps keep your spine neutral. Sitting   When sitting, keep your spine neutral and keep your feet flat on the floor. Use a footrest, if necessary, and keep your thighs parallel to the floor. Avoid rounding your shoulders, and avoid tilting your head forward.  When working at a desk or a computer, keep your desk at a height where your hands are slightly lower than your elbows. Slide your chair under your desk so you are close enough to maintain good posture.  When working at a computer, place your monitor at a height where you are looking straight ahead and you do not have to tilt your head forward or downward to look at the screen. Resting When lying down and resting, avoid positions that are most painful for you.  If you have pain with activities such as sitting, bending, stooping, or squatting (flexion-based activities), lie in a position in which your body does not bend very much. For example, avoid curling up on your side with your arms and knees near your chest (fetal position).  If you have pain with activities such as standing for a long time or reaching with your arms (extension-based activities), lie with your spine in a neutral position and bend your knees slightly. Try the following positions:  Lying on your side with a pillow between your knees.  Lying on your back with a  pillow under your knees.  Lifting   When lifting objects, keep your feet at least shoulder-width apart and tighten your abdominal muscles.  Bend your knees and hips and keep your spine neutral. It is important to lift using the strength of your legs, not your back. Do not lock your knees straight out.  Always ask for help to lift heavy or awkward objects. This information is not intended to replace advice given to you by your health care provider. Make sure you discuss any questions you have with your health care  provider. Document Released: 08/04/2005 Document Revised: 04/10/2016 Document Reviewed: 05/16/2015 Elsevier Interactive Patient Education  2019 [place] Strain  A strain is a stretch or tear in a muscle or the strong cords of tissue that attach muscle to bone (tendons). Strains of the lower back (lumbar spine) are a common cause of low back pain. A strain occurs when muscles or tendons are torn or are stretched beyond their limits. The muscles may become inflamed, resulting in pain and sudden muscle tightening (spasms). A strain can happen suddenly due to an injury (trauma), or it can develop gradually due to overuse. There are three types of strains:  Grade 1 is a mild strain involving a minor tear of the muscle fibers or tendons. This may cause some pain but no loss of muscle strength.  Grade 2 is a moderate strain involving a partial tear of the muscle fibers or tendons. This causes more severe pain and some loss of muscle strength.  Grade 3 is a severe strain involving a complete tear of the muscle or tendon. This causes severe pain and complete or nearly complete loss of muscle strength. What are the causes? This condition may be caused by:  Trauma, such as a fall or a hit to the body.  Twisting or overstretching the back. This may result from doing activities that require a lot of energy, such as lifting heavy objects. What increases the risk? The following factors may increase your risk of getting this condition:  Playing contact sports.  Participating in sports or activities that put excessive stress on the back and require a lot of bending and twisting, including: ? Lifting weights or heavy objects. ? Gymnastics. ? Soccer. ? Figure skating. ? Snowboarding.  Being overweight or obese.  Having poor strength and flexibility. What are the signs or symptoms? Symptoms of this condition may include:  Sharp or dull pain in the lower back that does not go away.  Pain may extend to the buttocks.  Stiffness.  Limited range of motion.  Inability to stand up straight due to stiffness or pain.  Muscle spasms. How is this diagnosed? This condition may be diagnosed based on:  Your symptoms.  Your medical history.  A physical exam. ? Your health care provider may push on certain areas of your back to determine the source of your pain. ? You may be asked to bend forward, backward, and side to side to assess the severity of your pain and your range of motion.  Imaging tests, such as: ? X-rays. ? MRI. How is this treated? Treatment for this condition may include:  Applying heat and cold to the affected area.  Medicines to help relieve pain and to relax your muscles (muscle relaxants).  NSAIDs to help reduce swelling and discomfort.  Physical therapy. When your symptoms improve, it is important to gradually return to your normal routine as soon as possible to reduce pain, avoid stiffness, and  avoid loss of muscle strength. Generally, symptoms should improve within 6 weeks of treatment. However, recovery time varies. Follow these instructions at home: Managing pain, stiffness, and swelling  If directed, apply ice to the injured area during the first 24 hours after your injury. ? Put ice in a plastic bag. ? Place a towel between your skin and the bag. ? Leave the ice on for 20 minutes, 2-3 times a day.  If directed, apply heat to the affected area as often as told by your health care provider. Use the heat source that your health care provider recommends, such as a moist heat pack or a heating pad. ? Place a towel between your skin and the heat source. ? Leave the heat on for 20-30 minutes. ? Remove the heat if your skin turns bright red. This is especially important if you are unable to feel pain, heat, or cold. You may have a greater risk of getting burned. Activity  Rest and return to your normal activities as told by your health care  provider. Ask your health care provider what activities are safe for you.  Avoid activities that take a lot of effort (are strenuous) for as long as told by your health care provider.  Do exercises as told by your health care provider. General instructions   Take over-the-counter and prescription medicines only as told by your health care provider.  If you have questions or concerns about safety while taking pain medicine, talk with your health care provider.  Do not drive or operate heavy machinery until you know how your pain medicine affects you.  Do not use any tobacco products, such as cigarettes, chewing tobacco, and e-cigarettes. Tobacco can delay bone healing. If you need help quitting, ask your health care provider.  Keep all follow-up visits as told by your health care provider. This is important. How is this prevented?               Warm up and stretch before being active.  Cool down and stretch after being active.  Give your body time to rest between periods of activity.  Avoid: ? Being physically inactive for long periods at a time. ? Exercising or playing sports when you are tired or in pain.  Use correct form when playing sports and lifting heavy objects.  Use good posture when sitting and standing.  Maintain a healthy weight.  Sleep on a mattress with medium firmness to support your back.  Make sure to use equipment that fits you, including shoes that fit well.  Be safe and responsible while being active to avoid falls.  Do at least 150 minutes of moderate-intensity exercise each week, such as brisk walking or water aerobics. Try a form of exercise that takes stress off your back, such as swimming or stationary cycling.  Maintain physical fitness, including: ? Strength. ? Flexibility. ? Cardiovascular fitness. ? Endurance. Contact a health care provider if:  Your back pain does not improve after 6 weeks of treatment.  Your symptoms get  worse. Get help right away if:  Your back pain is severe.  You are unable to stand or walk.  You develop pain in your legs.  You develop weakness in your buttocks or legs.  You have difficulty controlling when you urinate or when you have a bowel movement. This information is not intended to replace advice given to you by your health care provider. Make sure you discuss any questions you have with your health care  provider. Document Released: 08/04/2005 Document Revised: 09/29/2016 Document Reviewed: 05/16/2015 Elsevier Interactive Patient Education  2019 [place] is a term that is commonly used to refer to joint pain or joint disease. There are more than 100 types of arthritis. What are the causes? The most common cause of this condition is wear and tear of a joint. Other causes include:  Gout.  Inflammation of a joint.  An infection of a joint.  Sprains and other injuries near the joint.  A drug reaction or allergic reaction. In some cases, the cause may not be known. What are the signs or symptoms? The main symptom of this condition is pain in the joint with movement. Other symptoms include:  Redness, swelling, or stiffness at a joint.  Warmth coming from the joint.  Fever.  Overall feeling of illness. How is this diagnosed? This condition may be diagnosed with a physical exam and tests, including:  Blood tests.  Urine tests.  Imaging tests, such as MRI, X-rays, or a CT scan. Sometimes, fluid is removed from a joint for testing. How is this treated? Treatment for this condition may involve:  Treatment of the cause, if it is known.  Rest.  Raising (elevating) the joint.  Applying cold or hot packs to the joint.  Medicines to improve symptoms and reduce inflammation.  Injections of a steroid such as cortisone into the joint to help reduce pain and inflammation. Depending on the cause of your arthritis, you may need to make  lifestyle changes to reduce stress on your joint. These changes may include exercising more and losing weight. Follow these instructions at home: Medicines  Take over-the-counter and prescription medicines only as told by your health care provider.  Do not take aspirin to relieve pain if gout is suspected. Activity  Rest your joint if told by your health care provider. Rest is important when your disease is active and your joint feels painful, swollen, or stiff.  Avoid activities that make the pain worse. It is important to balance activity with rest.  Exercise your joint regularly with range-of-motion exercises as told by your health care provider. Try doing low-impact exercise, such as: ? Swimming. ? Water aerobics. ? Biking. ? Walking. Joint Care   If your joint is swollen, keep it elevated if told by your health care provider.  If your joint feels stiff in the morning, try taking a warm shower.  If directed, apply heat to the joint. If you have diabetes, do not apply heat without permission from your health care provider. ? Put a towel between the joint and the hot pack or heating pad. ? Leave the heat on the area for 20-30 minutes.  If directed, apply ice to the joint: ? Put ice in a plastic bag. ? Place a towel between your skin and the bag. ? Leave the ice on for 20 minutes, 2-3 times per day.  Keep all follow-up visits as told by your health care provider. This is important. Contact a health care provider if:  The pain gets worse.  You have a fever. Get help right away if:  You develop severe joint pain, swelling, or redness.  Many joints become painful and swollen.  You develop severe back pain.  You develop severe weakness in your leg.  You cannot control your bladder or bowels. This information is not intended to replace advice given to you by your health care provider. Make sure you discuss any questions you have with your health  care provider. Document  Released: 09/11/2004 Document Revised: 01/10/2016 Document Reviewed: 10/30/2014 Elsevier Interactive Patient Education  2019 [employer].

## 2018-11-08 ENCOUNTER — Ambulatory Visit: Payer: Self-pay | Admitting: *Deleted

## 2018-11-08 ENCOUNTER — Other Ambulatory Visit: Payer: Self-pay

## 2018-11-08 VITALS — BP 144/87 | Ht 67.0 in | Wt 161.0 lb

## 2018-11-08 DIAGNOSIS — Z0000 Encounter for general adult medical examination without abnormal findings: Secondary | ICD-10-CM

## 2018-11-08 NOTE — Progress Notes (Signed)
[employer] insurance premium discount evaluation: Labs Drawn. [employer] ROI form signed. Tobacco Free Attestation form signed.  Forms placed in paper chart. Okay to route results to Burnis Kingfisher, MD per pt.

## 2018-11-09 LAB — CMP12+LP+TP+TSH+6AC+PSA+CBC…
A/G RATIO: 1.8 (ref 1.2–2.2)
ALK PHOS: 38 IU/L — AB (ref 39–117)
ALT: 31 IU/L (ref 0–44)
AST: 29 IU/L (ref 0–40)
Albumin: 4.8 g/dL (ref 3.8–4.9)
BASOS ABS: 0 10*3/uL (ref 0.0–0.2)
BUN/Creatinine Ratio: 9 (ref 9–20)
BUN: 10 mg/dL (ref 6–24)
Basos: 1 %
Bilirubin Total: 0.4 mg/dL (ref 0.0–1.2)
CHOL/HDL RATIO: 4.8 ratio (ref 0.0–5.0)
CHOLESTEROL TOTAL: 224 mg/dL — AB (ref 100–199)
Calcium: 9.8 mg/dL (ref 8.7–10.2)
Chloride: 99 mmol/L (ref 96–106)
Creatinine, Ser: 1.14 mg/dL (ref 0.76–1.27)
EOS (ABSOLUTE): 0.2 10*3/uL (ref 0.0–0.4)
Eos: 4 %
Estimated CHD Risk: 1 times avg. (ref 0.0–1.0)
FREE THYROXINE INDEX: 1.5 (ref 1.2–4.9)
GFR calc Af Amer: 85 mL/min/{1.73_m2} (ref >59)
GFR calc non Af Amer: 74 mL/min/{1.73_m2} (ref >59)
GGT: 31 IU/L (ref 0–65)
Globulin, Total: 2.7 g/dL (ref 1.5–4.5)
Glucose: 98 mg/dL (ref 65–99)
HDL: 47 mg/dL (ref >39)
HEMATOCRIT: 47.3 % (ref 37.5–51.0)
Hemoglobin: 15.5 g/dL (ref 13.0–17.7)
IMMATURE GRANS (ABS): 0 10*3/uL (ref 0.0–0.1)
IMMATURE GRANULOCYTES: 0 %
Iron: 91 ug/dL (ref 38–169)
LDH: 247 IU/L — AB (ref 121–224)
LDL Calculated: 169 mg/dL — ABNORMAL HIGH (ref 0–99)
LYMPHS ABS: 1.7 10*3/uL (ref 0.7–3.1)
LYMPHS: 42 %
MCH: 30 pg (ref 26.6–33.0)
MCHC: 32.8 g/dL (ref 31.5–35.7)
MCV: 92 fL (ref 79–97)
MONOS ABS: 0.5 10*3/uL (ref 0.1–0.9)
Monocytes: 13 %
NEUTROS PCT: 40 %
Neutrophils Absolute: 1.6 10*3/uL (ref 1.4–7.0)
PHOSPHORUS: 2.9 mg/dL (ref 2.8–4.1)
PLATELETS: 217 10*3/uL (ref 150–450)
PROSTATE SPECIFIC AG, SERUM: 1 ng/mL (ref 0.0–4.0)
Potassium: 4.8 mmol/L (ref 3.5–5.2)
RBC: 5.17 x10E6/uL (ref 4.14–5.80)
RDW: 13.2 % (ref 11.6–15.4)
SODIUM: 140 mmol/L (ref 134–144)
T3 UPTAKE RATIO: 21 % — AB (ref 24–39)
T4 TOTAL: 7 ug/dL (ref 4.5–12.0)
TRIGLYCERIDES: 42 mg/dL (ref 0–149)
TSH: 2.3 u[IU]/mL (ref 0.450–4.500)
Total Protein: 7.5 g/dL (ref 6.0–8.5)
Uric Acid: 6 mg/dL (ref 3.7–8.6)
VLDL Cholesterol Cal: 8 mg/dL (ref 5–40)
WBC: 4 10*3/uL (ref 3.4–10.8)

## 2018-11-09 LAB — HGB A1C W/O EAG: Hgb A1c MFr Bld: 6 % — ABNORMAL HIGH (ref 4.8–5.6)

## 2018-11-10 NOTE — Progress Notes (Signed)
noted 

## 2018-11-15 ENCOUNTER — Telehealth (INDEPENDENT_AMBULATORY_CARE_PROVIDER_SITE_OTHER): Payer: PRIVATE HEALTH INSURANCE | Admitting: Emergency Medicine

## 2018-11-15 ENCOUNTER — Ambulatory Visit: Payer: PRIVATE HEALTH INSURANCE | Admitting: Emergency Medicine

## 2018-11-15 ENCOUNTER — Other Ambulatory Visit: Payer: Self-pay

## 2018-11-15 DIAGNOSIS — R740 Nonspecific elevation of levels of transaminase and lactic acid dehydrogenase [LDH]: Secondary | ICD-10-CM

## 2018-11-15 DIAGNOSIS — R7303 Prediabetes: Secondary | ICD-10-CM

## 2018-11-15 DIAGNOSIS — M25531 Pain in right wrist: Secondary | ICD-10-CM | POA: Insufficient documentation

## 2018-11-15 DIAGNOSIS — E7800 Pure hypercholesterolemia, unspecified: Secondary | ICD-10-CM

## 2018-11-15 DIAGNOSIS — R7402 Elevation of levels of lactic acid dehydrogenase (LDH): Secondary | ICD-10-CM | POA: Insufficient documentation

## 2018-11-15 NOTE — Assessment & Plan Note (Signed)
Diet and exercise is recommended.

## 2018-11-15 NOTE — Assessment & Plan Note (Signed)
Diet and exercise recommended.  Repeat in 3 to 6 months.

## 2018-11-15 NOTE — Progress Notes (Signed)
Telemedicine Encounter- SOAP NOTE Established Patient  This telephone encounter was conducted with the patient's (or proxy's) verbal consent via audio telecommunications: yes/no: Yes Patient was instructed to have this encounter in a suitably private space; and to only have persons present to whom they give permission to participate. In addition, patient identity was confirmed by use of name plus two identifiers (DOB and address).  I discussed the limitations, risks, security and privacy concerns of performing an evaluation and management service by telephone and the availability of in person appointments. I also discussed with the patient that there may be a patient responsible charge related to this service. The patient expressed understanding and agreed to proceed.  I spent a total of TIME; 0 MIN TO 60 MIN: 15 minutes talking with the patient or their proxy.  No chief complaint on file.   Subjective   Grant Wilson is a 53 y.o.male established patient. Telephone visit today for evaluation of recent abnormal labs.  LDH came back elevated at 247 and abnormal lipid profile with mild elevation of cholesterol and LDL.  Also complaining of a chronic intermittent discomfort to his right wrist without any injuries.  Describes the discomfort as a warm sensation that does not interfere with daily activities.  Discomfort not bad enough to take medications. Recent Results (from the past 2160 hour(s))  Male Executive Panel     Status: Abnormal   Collection Time: 11/08/18  9:08 AM  Result Value Ref Range   Glucose 98 65 - 99 mg/dL   Uric Acid 6.0 3.7 - 8.6 mg/dL    Comment:            Therapeutic target for gout patients: <6.0   BUN 10 6 - 24 mg/dL   Creatinine, Ser 1.14 0.76 - 1.27 mg/dL   GFR calc non Af Amer 74 >59 mL/min/1.73   GFR calc Af Amer 85 >59 mL/min/1.73   BUN/Creatinine Ratio 9 9 - 20   Sodium 140 134 - 144 mmol/L   Potassium 4.8 3.5 - 5.2 mmol/L   Chloride 99 96 - 106 mmol/L   Calcium 9.8 8.7 - 10.2 mg/dL   Phosphorus 2.9 2.8 - 4.1 mg/dL   Total Protein 7.5 6.0 - 8.5 g/dL   Albumin 4.8 3.8 - 4.9 g/dL   Globulin, Total 2.7 1.5 - 4.5 g/dL   Albumin/Globulin Ratio 1.8 1.2 - 2.2   Bilirubin Total 0.4 0.0 - 1.2 mg/dL   Alkaline Phosphatase 38 (L) 39 - 117 IU/L   LDH 247 (H) 121 - 224 IU/L   AST 29 0 - 40 IU/L   ALT 31 0 - 44 IU/L   GGT 31 0 - 65 IU/L   Iron 91 38 - 169 ug/dL   Cholesterol, Total 224 (H) 100 - 199 mg/dL   Triglycerides 42 0 - 149 mg/dL   HDL 47 >39 mg/dL   VLDL Cholesterol Cal 8 5 - 40 mg/dL   LDL Calculated 169 (H) 0 - 99 mg/dL   Chol/HDL Ratio 4.8 0.0 - 5.0 ratio    Comment:                                   T. Chol/HDL Ratio  Men  Women                               1/2 Avg.Risk  3.4    3.3                                   Avg.Risk  5.0    4.4                                2X Avg.Risk  9.6    7.1                                3X Avg.Risk 23.4   11.0    Estimated CHD Risk 1.0 0.0 - 1.0 times avg.    Comment:                                   T. Chol/HDL Ratio                                             Men  Women                               1/2 Avg.Risk  3.4    3.3                                   Avg.Risk  5.0    4.4                                2X Avg.Risk  9.6    7.1                                3X Avg.Risk 23.4   11.0 The CHD Risk is based on the T. Chol/HDL ratio.  Other factors affect CHD Risk such as hypertension, smoking, diabetes, severe obesity, and family history of pre- mature CHD.    TSH 2.300 0.450 - 4.500 uIU/mL   T4, Total 7.0 4.5 - 12.0 ug/dL   T3 Uptake Ratio 21 (L) 24 - 39 %   Free Thyroxine Index 1.5 1.2 - 4.9   Prostate Specific Ag, Serum 1.0 0.0 - 4.0 ng/mL    Comment: Roche ECLIA methodology. According to the American Urological Association, Serum PSA should decrease and remain at undetectable levels after radical prostatectomy. The AUA defines biochemical  recurrence as an initial PSA value 0.2 ng/mL or greater followed by a subsequent confirmatory PSA value 0.2 ng/mL or greater. Values obtained with different assay methods or kits cannot be used interchangeably. Results cannot be interpreted as absolute evidence of the presence or absence of malignant disease.    WBC 4.0 3.4 - 10.8 x10E3/uL   RBC 5.17 4.14 - 5.80 x10E6/uL   Hemoglobin 15.5 13.0 - 17.7 g/dL   Hematocrit 47.3 37.5 -  51.0 %   MCV 92 79 - 97 fL   MCH 30.0 26.6 - 33.0 pg   MCHC 32.8 31.5 - 35.7 g/dL   RDW 13.2 11.6 - 15.4 %   Platelets 217 150 - 450 x10E3/uL   Neutrophils 40 Not Estab. %   Lymphs 42 Not Estab. %   Monocytes 13 Not Estab. %   Eos 4 Not Estab. %   Basos 1 Not Estab. %   Neutrophils Absolute 1.6 1.4 - 7.0 x10E3/uL   Lymphocytes Absolute 1.7 0.7 - 3.1 x10E3/uL   Monocytes Absolute 0.5 0.1 - 0.9 x10E3/uL   EOS (ABSOLUTE) 0.2 0.0 - 0.4 x10E3/uL   Basophils Absolute 0.0 0.0 - 0.2 x10E3/uL   Immature Granulocytes 0 Not Estab. %   Immature Grans (Abs) 0.0 0.0 - 0.1 x10E3/uL  Hemoglobin A1c     Status: Abnormal   Collection Time: 11/08/18  9:08 AM  Result Value Ref Range   Hgb A1c MFr Bld 6.0 (H) 4.8 - 5.6 %    Comment:          Prediabetes: 5.7 - 6.4          Diabetes: >6.4          Glycemic control for adults with diabetes: <7.0      HPI   Patient Active Problem List   Diagnosis Date Noted  . Prediabetes 10/24/2017  . Gastroesophageal reflux disease without esophagitis 11/08/2016  . DDD (degenerative disc disease), lumbar 01/11/2016  . Chronic low back pain 07/22/2012  . Elevated cholesterol 05/12/2012  . Seasonal allergies 05/12/2012  . Hepatitis B 09/22/2011    Past Medical History:  Diagnosis Date  . Allergy   . GERD (gastroesophageal reflux disease)   . Hepatitis B   . Hyperlipidemia   . Prediabetes     Current Outpatient Medications  Medication Sig Dispense Refill  . tenofovir (VIREAD) 300 MG tablet Take 300 mg by mouth daily.  Reported on 01/11/2016     No current facility-administered medications for this visit.     No Known Allergies  Social History   Socioeconomic History  . Marital status: Single    Spouse name: n/a  . Number of children: 0  . Years of education: associates  . Highest education level: Not on file  Occupational History  . Occupation: IT department    Comment: [employer]  Social Needs  . Financial resource strain: Not on file  . Food insecurity:    Worry: Not on file    Inability: Not on file  . Transportation needs:    Medical: Not on file    Non-medical: Not on file  Tobacco Use  . Smoking status: Never Smoker  . Smokeless tobacco: Never Used  Substance and Sexual Activity  . Alcohol use: No    Alcohol/week: 0.0 standard drinks  . Drug use: No  . Sexual activity: Yes    Partners: Female    Birth control/protection: Condom  Lifestyle  . Physical activity:    Days per week: Not on file    Minutes per session: Not on file  . Stress: Not on file  Relationships  . Social connections:    Talks on phone: Not on file    Gets together: Not on file    Attends religious service: Not on file    Active member of club or organization: Not on file    Attends meetings of clubs or organizations: Not on file    Relationship status: Not on  file  . Intimate partner violence:    Fear of current or ex partner: Not on file    Emotionally abused: Not on file    Physically abused: Not on file    Forced sexual activity: Not on file  Other Topics Concern  . Not on file  Social History Narrative   From [place]. Came to the [place] in 1997.   Lives alone.   No children    Review of Systems  Constitutional: Negative.  Negative for chills and fever.  HENT: Negative for sore throat.   Eyes: Negative for discharge and redness.  Respiratory: Negative for cough and shortness of breath.   Cardiovascular: Negative for chest pain and palpitations.  Gastrointestinal: Negative for abdominal pain,  diarrhea, nausea and vomiting.  Musculoskeletal: Positive for joint pain (Right wrist). Negative for myalgias.  Neurological: Negative for dizziness and headaches.  Endo/Heme/Allergies: Negative.   All other systems reviewed and are negative.   Objective   Vitals as reported by the patient: None available There were no vitals filed for this visit.  There are no diagnoses linked to this encounter. Diagnoses and all orders for this visit:  Prediabetes  Right wrist pain  High cholesterol  Elevated LDH  High cholesterol Diet and exercise recommended.  Repeat in 3 to 6 months.  Prediabetes Diet and exercise is recommended.    I discussed the assessment and treatment plan with the patient. The patient was provided an opportunity to ask questions and all were answered. The patient agreed with the plan and demonstrated an understanding of the instructions.   The patient was advised to call back or seek an in-person evaluation if the symptoms worsen or if the condition fails to improve as anticipated.  I provided 15 minutes of non-face-to-face time during this encounter.  Horald Pollen, MD  Primary Care at [facility]

## 2019-01-22 ENCOUNTER — Ambulatory Visit (HOSPITAL_COMMUNITY)
Admission: EM | Admit: 2019-01-22 | Discharge: 2019-01-22 | Disposition: A | Discharge status: Home / Self Care | Payer: PRIVATE HEALTH INSURANCE | Attending: Family Medicine | Admitting: Family Medicine

## 2019-01-22 ENCOUNTER — Encounter (HOSPITAL_COMMUNITY): Payer: Self-pay | Admitting: Emergency Medicine

## 2019-01-22 ENCOUNTER — Other Ambulatory Visit: Payer: Self-pay

## 2019-01-22 DIAGNOSIS — J302 Other seasonal allergic rhinitis: Secondary | ICD-10-CM

## 2019-01-22 MED ORDER — LORATADINE 10 MG PO TABS: 10.0000 mg | ORAL_TABLET | Freq: Every day | ORAL | 2 refills | Status: DC

## 2019-01-22 MED ORDER — PREDNISONE 10 MG (21) PO TBPK
ORAL_TABLET | Freq: Every day | ORAL | 0 refills | Status: DC
Start: 2019-01-22 — End: 2021-06-04

## 2019-01-22 NOTE — ED Provider Notes (Signed)
[place]   [id number] 01/22/19 Arrival Time: 4580  ASSESSMENT & PLAN:  1. Seasonal allergies     Meds ordered this encounter  Medications  . loratadine (CLARITIN) 10 MG tablet    Sig: Take 1 tablet (10 mg total) by mouth daily.    Dispense:  30 tablet    Refill:  2  . predniSONE (STERAPRED UNI-PAK 21 TAB) 10 MG (21) TBPK tablet    Sig: Take by mouth daily. Take as directed.    Dispense:  21 tablet    Refill:  0    May f/u with PCP or here as needed.  Reviewed expectations re: course of current medical issues. Questions answered. Outlined signs and symptoms indicating need for more acute intervention. Patient verbalized understanding. After Visit Summary given.   SUBJECTIVE: History from: patient.  Grant Wilson is a 53 y.o. male who presents with complaint of seasonal allergy exacerbation. Gradual onset over 3-4 weeks. Sneezing, nasal congestion, post-nasal drainage. No coughing/SOB/wheezing reported. No ST. Afebrile. Claritin has helped but requests refill. Flonase with a little help. Worse after he mows lawn. Better indoors.  Social History   Tobacco Use  Smoking Status Never Smoker  Smokeless Tobacco Never Used    ROS: As per HPI.   OBJECTIVE:  Vitals:   01/22/19 1114  BP: (!) 169/82  Pulse: 100  Resp: 16  Temp: 98.1 F (36.7 C)  TempSrc: Oral  SpO2: 100%     General appearance: alert; appears fatigued HEENT: nasal congestion; clear runny nose; throat irritation secondary to post-nasal drainage Neck: supple without LAD CV: RRR Lungs: unlabored respirations, symmetrical air entry without wheezing; cough: absent Abd: soft Ext: no LE edema Skin: warm and dry Psychological: alert and cooperative; normal mood and affect   No Known Allergies  Past Medical History:  Diagnosis Date  . Allergy   . GERD (gastroesophageal reflux disease)   . Hepatitis B   . Hyperlipidemia   . Prediabetes     Social History   Socioeconomic  History  . Marital status: Single    Spouse name: n/a  . Number of children: 0  . Years of education: associates  . Highest education level: Not on file  Occupational History  . Occupation: IT department    Comment: [employer]  Social Needs  . Financial resource strain: Not on file  . Food insecurity:    Worry: Not on file    Inability: Not on file  . Transportation needs:    Medical: Not on file    Non-medical: Not on file  Tobacco Use  . Smoking status: Never Smoker  . Smokeless tobacco: Never Used  Substance and Sexual Activity  . Alcohol use: No    Alcohol/week: 0.0 standard drinks  . Drug use: No  . Sexual activity: Yes    Partners: Female    Birth control/protection: Condom  Lifestyle  . Physical activity:    Days per week: Not on file    Minutes per session: Not on file  . Stress: Not on file  Relationships  . Social connections:    Talks on phone: Not on file    Gets together: Not on file    Attends religious service: Not on file    Active member of club or organization: Not on file    Attends meetings of clubs or organizations: Not on file    Relationship status: Not on file  . Intimate partner violence:    Fear of current or ex  partner: Not on file    Emotionally abused: Not on file    Physically abused: Not on file    Forced sexual activity: Not on file  Other Topics Concern  . Not on file  Social History Narrative   From [place]. Came to the [place] in 1997.   Lives alone.   No children           Vanessa Kick, MD 01/22/19 1207

## 2019-01-22 NOTE — ED Triage Notes (Signed)
Per pt he has been having sneezing and mucus in his throat and has been taking claritin with relief but only had a 10 day supply. Alo using Flonase with relief also. Just wanted to make sure nothing else was going on. No fevers no chills no cough

## 2019-02-15 ENCOUNTER — Ambulatory Visit: Payer: PRIVATE HEALTH INSURANCE | Admitting: Emergency Medicine

## 2019-02-17 ENCOUNTER — Ambulatory Visit: Payer: PRIVATE HEALTH INSURANCE | Admitting: Emergency Medicine

## 2019-11-11 ENCOUNTER — Encounter: Payer: Self-pay | Admitting: Registered Nurse

## 2019-11-11 ENCOUNTER — Ambulatory Visit (INDEPENDENT_AMBULATORY_CARE_PROVIDER_SITE_OTHER): Payer: PRIVATE HEALTH INSURANCE | Admitting: Registered Nurse

## 2019-11-11 ENCOUNTER — Other Ambulatory Visit: Payer: Self-pay

## 2019-11-11 VITALS — BP 146/96 | HR 82 | Temp 98.0°F | Resp 17 | Ht 67.0 in | Wt 157.0 lb

## 2019-11-11 DIAGNOSIS — Z125 Encounter for screening for malignant neoplasm of prostate: Secondary | ICD-10-CM

## 2019-11-11 DIAGNOSIS — R7303 Prediabetes: Secondary | ICD-10-CM

## 2019-11-11 DIAGNOSIS — Z0001 Encounter for general adult medical examination with abnormal findings: Secondary | ICD-10-CM | POA: Diagnosis not present

## 2019-11-11 DIAGNOSIS — Z1329 Encounter for screening for other suspected endocrine disorder: Secondary | ICD-10-CM | POA: Diagnosis not present

## 2019-11-11 DIAGNOSIS — Z13220 Encounter for screening for lipoid disorders: Secondary | ICD-10-CM

## 2019-11-11 DIAGNOSIS — Z1211 Encounter for screening for malignant neoplasm of colon: Secondary | ICD-10-CM

## 2019-11-11 DIAGNOSIS — Z13228 Encounter for screening for other metabolic disorders: Secondary | ICD-10-CM

## 2019-11-11 DIAGNOSIS — Z130 Encounter for screening for diseases of the blood and blood-forming organs and certain disorders involving the immune mechanism: Secondary | ICD-10-CM

## 2019-11-11 NOTE — Progress Notes (Signed)
Established Patient Office Visit  Subjective:  Patient ID: Grant Wilson, male    DOB: 08/21/1965  Age: 54 y.o. MRN: [id number]  CC:  Chief Complaint  Patient presents with  . Transitions Of Care    physical    HPI [name] presents for visit to establish care and CPE  No complaints today, feeling well overall Due for colonoscopy, will place referral  Requesting full labs  Past Medical History:  Diagnosis Date  . Allergy   . GERD (gastroesophageal reflux disease)   . Hepatitis B   . Hyperlipidemia   . Prediabetes     Past Surgical History:  Procedure Laterality Date  . NO PAST SURGERIES      History reviewed. No pertinent family history.  Social History   Socioeconomic History  . Marital status: Single    Spouse name: n/a  . Number of children: 0  . Years of education: associates  . Highest education level: Not on file  Occupational History  . Occupation: IT department    Comment: [employer]  Tobacco Use  . Smoking status: Never Smoker  . Smokeless tobacco: Never Used  Substance and Sexual Activity  . Alcohol use: No    Alcohol/week: 0.0 standard drinks  . Drug use: No  . Sexual activity: Yes    Partners: Female    Birth control/protection: Condom  Other Topics Concern  . Not on file  Social History Narrative   From [place]. Came to the [place] in 1997.   Lives alone.   No children   Social Determinants of Health   Financial Resource Strain:   . Difficulty of Paying Living Expenses:   Food Insecurity:   . Worried About Charity fundraiser in the Last Year:   . Arboriculturist in the Last Year:   Transportation Needs:   . Film/video editor (Medical):   Marland Kitchen Lack of Transportation (Non-Medical):   Physical Activity:   . Days of Exercise per Week:   . Minutes of Exercise per Session:   Stress:   . Feeling of Stress :   Social Connections:   . Frequency of Communication with Friends and Family:   . Frequency of Social Gatherings  with Friends and Family:   . Attends Religious Services:   . Active Member of Clubs or Organizations:   . Attends Archivist Meetings:   Marland Kitchen Marital Status:   Intimate Partner Violence:   . Fear of Current or Ex-Partner:   . Emotionally Abused:   Marland Kitchen Physically Abused:   . Sexually Abused:     Outpatient Medications Prior to Visit  Medication Sig Dispense Refill  . tenofovir (VIREAD) 300 MG tablet Take 300 mg by mouth daily. Reported on 01/11/2016    . loratadine (CLARITIN) 10 MG tablet Take 1 tablet (10 mg total) by mouth daily. (Patient not taking: Reported on 11/11/2019) 30 tablet 2  . predniSONE (STERAPRED UNI-PAK 21 TAB) 10 MG (21) TBPK tablet Take by mouth daily. Take as directed. (Patient not taking: Reported on 11/11/2019) 21 tablet 0   No facility-administered medications prior to visit.    No Known Allergies  ROS Review of Systems  Constitutional: Negative.   HENT: Negative.   Eyes: Negative.   Respiratory: Negative.   Cardiovascular: Negative.   Gastrointestinal: Negative.   Endocrine: Negative.   Genitourinary: Negative.   Musculoskeletal: Negative.   Skin: Negative.   Allergic/Immunologic: Negative.   Neurological: Negative.   Hematological: Negative.  Psychiatric/Behavioral: Negative.   All other systems reviewed and are negative.     Objective:    Physical Exam  Constitutional: He is oriented to person, place, and time. He appears well-developed and well-nourished. No distress.  HENT:  Head: Normocephalic and atraumatic.  Right Ear: External ear normal.  Left Ear: External ear normal.  Mouth/Throat: Oropharynx is clear and moist. No oropharyngeal exudate.  Eyes: Pupils are equal, round, and reactive to light. Conjunctivae and EOM are normal. Right eye exhibits no discharge. Left eye exhibits no discharge. No scleral icterus.  Neck: No tracheal deviation present. No thyromegaly present.  Cardiovascular: Normal rate, regular rhythm, normal heart  sounds and intact distal pulses. Exam reveals no gallop and no friction rub.  No murmur heard. Pulmonary/Chest: Effort normal and breath sounds normal. No respiratory distress. He has no wheezes. He has no rales. He exhibits no tenderness.  Abdominal: Soft. Bowel sounds are normal. He exhibits no distension and no mass. There is no abdominal tenderness. There is no rebound and no guarding.  Musculoskeletal:        General: No tenderness, deformity or edema. Normal range of motion.     Cervical back: Normal range of motion and neck supple.  Lymphadenopathy:    He has no cervical adenopathy.  Neurological: He is alert and oriented to person, place, and time. No cranial nerve deficit. He exhibits normal muscle tone. Coordination normal.  Skin: Skin is warm and dry. No rash noted. He is not diaphoretic. No erythema. No pallor.  Psychiatric: He has a normal mood and affect. His behavior is normal. Judgment and thought content normal.  Nursing note and vitals reviewed.   BP (!) 146/96   Pulse 82   Temp 98 F (36.7 C) (Temporal)   Resp 17   Ht 5\' 7"  (1.702 m)   Wt 157 lb (71.2 kg)   SpO2 99%   BMI 24.59 kg/m  Wt Readings from Last 3 Encounters:  11/11/19 157 lb (71.2 kg)  11/08/18 161 lb (73 kg)  11/03/17 162 lb (73.5 kg)     There are no preventive care reminders to display for this patient.  There are no preventive care reminders to display for this patient.  Lab Results  Component Value Date   TSH 2.300 11/08/2018   Lab Results  Component Value Date   WBC 4.0 11/08/2018   HGB 15.5 11/08/2018   HCT 47.3 11/08/2018   MCV 92 11/08/2018   PLT 217 11/08/2018   Lab Results  Component Value Date   NA 140 11/08/2018   K 4.8 11/08/2018   CO2 26 11/08/2016   GLUCOSE 98 11/08/2018   BUN 10 11/08/2018   CREATININE 1.14 11/08/2018   BILITOT 0.4 11/08/2018   ALKPHOS 38 (L) 11/08/2018   AST 29 11/08/2018   ALT 31 11/08/2018   PROT 7.5 11/08/2018   ALBUMIN 4.8 11/08/2018    CALCIUM 9.8 11/08/2018   Lab Results  Component Value Date   CHOL 224 (H) 11/08/2018   Lab Results  Component Value Date   HDL 47 11/08/2018   Lab Results  Component Value Date   LDLCALC 169 (H) 11/08/2018   Lab Results  Component Value Date   TRIG 42 11/08/2018   Lab Results  Component Value Date   CHOLHDL 4.8 11/08/2018   Lab Results  Component Value Date   HGBA1C 6.0 (H) 11/08/2018      Assessment & Plan:   Problem List Items Addressed This Visit  Other   Prediabetes   Relevant Orders   Hemoglobin A1c    Other Visit Diagnoses    Special screening for malignant neoplasms, colon    -  Primary   Relevant Orders   Ambulatory referral to Gastroenterology   Screening for endocrine, metabolic and immunity disorder       Relevant Orders   TSH   CBC with Differential   Comprehensive metabolic panel   Lipid screening       Relevant Orders   Lipid panel   Screening PSA (prostate specific antigen)       Relevant Orders   PSA      No orders of the defined types were placed in this encounter.   Follow-up: No follow-ups on file.   PLAN  Reassuring findings on exam  Labs drawn, will follow up as warranted  Referral sent for colonoscopy  Patient encouraged to call clinic with any questions, comments, or concerns.  Maximiano Coss, NP

## 2019-11-11 NOTE — Patient Instructions (Signed)
° ° ° °  If you have lab work done today you will be contacted with your lab results within the next 2 weeks.  If you have not heard from us then please contact us. The fastest way to get your results is to register for My Chart. ° ° °IF you received an x-ray today, you will receive an invoice from [facility]. Please contact [facility] at [phone] with questions or concerns regarding your invoice.  ° °IF you received labwork today, you will receive an invoice from [facility]. Please contact [facility] at [phone] with questions or concerns regarding your invoice.  ° °Our billing staff will not be able to assist you with questions regarding bills from these companies. ° °You will be contacted with the lab results as soon as they are available. The fastest way to get your results is to activate your My Chart account. Instructions are located on the last page of this paperwork. If you have not heard from us regarding the results in 2 weeks, please contact this office. °  ° ° ° °

## 2019-11-12 LAB — CBC WITH DIFFERENTIAL/PLATELET
Basophils Absolute: 0 10*3/uL (ref 0.0–0.2)
Basos: 1 %
EOS (ABSOLUTE): 0.1 10*3/uL (ref 0.0–0.4)
Eos: 3 %
Hematocrit: 44.8 % (ref 37.5–51.0)
Hemoglobin: 15.4 g/dL (ref 13.0–17.7)
Immature Grans (Abs): 0 10*3/uL (ref 0.0–0.1)
Immature Granulocytes: 0 %
Lymphocytes Absolute: 1.3 10*3/uL (ref 0.7–3.1)
Lymphs: 45 %
MCH: 32.5 pg (ref 26.6–33.0)
MCHC: 34.4 g/dL (ref 31.5–35.7)
MCV: 95 fL (ref 79–97)
Monocytes Absolute: 0.5 10*3/uL (ref 0.1–0.9)
Monocytes: 17 %
Neutrophils Absolute: 1 10*3/uL — ABNORMAL LOW (ref 1.4–7.0)
Neutrophils: 34 %
Platelets: 218 10*3/uL (ref 150–450)
RBC: 4.74 x10E6/uL (ref 4.14–5.80)
RDW: 12.6 % (ref 11.6–15.4)
WBC: 3 10*3/uL — ABNORMAL LOW (ref 3.4–10.8)

## 2019-11-12 LAB — LIPID PANEL
Chol/HDL Ratio: 4 ratio (ref 0.0–5.0)
Cholesterol, Total: 212 mg/dL — ABNORMAL HIGH (ref 100–199)
HDL: 53 mg/dL (ref >39)
LDL Chol Calc (NIH): 148 mg/dL — ABNORMAL HIGH (ref 0–99)
Triglycerides: 60 mg/dL (ref 0–149)
VLDL Cholesterol Cal: 11 mg/dL (ref 5–40)

## 2019-11-12 LAB — COMPREHENSIVE METABOLIC PANEL
ALT: 18 IU/L (ref 0–44)
AST: 27 IU/L (ref 0–40)
Albumin/Globulin Ratio: 2.2 (ref 1.2–2.2)
Albumin: 4.8 g/dL (ref 3.8–4.9)
Alkaline Phosphatase: 52 IU/L (ref 39–117)
BUN/Creatinine Ratio: 4 — ABNORMAL LOW (ref 9–20)
BUN: 4 mg/dL — ABNORMAL LOW (ref 6–24)
Bilirubin Total: 0.5 mg/dL (ref 0.0–1.2)
CO2: 22 mmol/L (ref 20–29)
Calcium: 9.5 mg/dL (ref 8.7–10.2)
Chloride: 100 mmol/L (ref 96–106)
Creatinine, Ser: 1.01 mg/dL (ref 0.76–1.27)
GFR calc Af Amer: 97 mL/min/{1.73_m2} (ref >59)
GFR calc non Af Amer: 84 mL/min/{1.73_m2} (ref >59)
Globulin, Total: 2.2 g/dL (ref 1.5–4.5)
Glucose: 97 mg/dL (ref 65–99)
Potassium: 4.2 mmol/L (ref 3.5–5.2)
Sodium: 137 mmol/L (ref 134–144)
Total Protein: 7 g/dL (ref 6.0–8.5)

## 2019-11-12 LAB — TSH: TSH: 0.94 u[IU]/mL (ref 0.450–4.500)

## 2019-11-12 LAB — HEMOGLOBIN A1C
Est. average glucose Bld gHb Est-mCnc: 117 mg/dL
Hgb A1c MFr Bld: 5.7 % — ABNORMAL HIGH (ref 4.8–5.6)

## 2019-11-12 LAB — PSA: Prostate Specific Ag, Serum: 1.2 ng/mL (ref 0.0–4.0)

## 2019-11-16 ENCOUNTER — Other Ambulatory Visit: Payer: Self-pay

## 2019-11-16 ENCOUNTER — Telehealth: Payer: Self-pay | Admitting: Registered Nurse

## 2019-11-16 NOTE — Telephone Encounter (Signed)
Pt stated he had an appt on 11/11/19 and was supposed to have medication sent to the pharmacy. Stated he went to the pharmacy and nothing was there. Looking for loratadine (CLARITIN) 10 MG tablet And a second medication. Please advise.  [facility] [phone] - [place], [address] AT [place] Phone:  [phone]  Fax:  [phone]

## 2019-11-22 ENCOUNTER — Encounter: Payer: Self-pay | Admitting: Registered Nurse

## 2019-11-22 DIAGNOSIS — E7800 Pure hypercholesterolemia, unspecified: Secondary | ICD-10-CM

## 2019-11-22 MED ORDER — ATORVASTATIN CALCIUM 40 MG PO TABS: 40.0000 mg | ORAL_TABLET | Freq: Every day | ORAL | 1 refills | Status: DC

## 2020-04-30 ENCOUNTER — Other Ambulatory Visit: Payer: Self-pay

## 2020-04-30 ENCOUNTER — Ambulatory Visit: Payer: Self-pay | Admitting: *Deleted

## 2020-04-30 VITALS — BP 149/84 | HR 81

## 2020-04-30 DIAGNOSIS — Z0130 Encounter for examination of blood pressure without abnormal findings: Secondary | ICD-10-CM

## 2020-05-24 ENCOUNTER — Other Ambulatory Visit: Payer: Self-pay

## 2020-05-24 ENCOUNTER — Encounter: Payer: Self-pay | Admitting: Registered Nurse

## 2020-05-24 ENCOUNTER — Ambulatory Visit: Payer: Self-pay | Admitting: Registered Nurse

## 2020-05-24 VITALS — BP 150/84 | HR 80 | Temp 97.6°F

## 2020-05-24 DIAGNOSIS — M25541 Pain in joints of right hand: Secondary | ICD-10-CM

## 2020-05-24 MED ORDER — ACETAMINOPHEN 500 MG PO TABS: 1000.0000 mg | ORAL_TABLET | Freq: Four times a day (QID) | ORAL | 0 refills | Status: AC | PRN

## 2020-05-24 NOTE — Progress Notes (Signed)
Subjective:    Patient ID: Grant Wilson, male    DOB: 08-Jan-1966, 54 y.o.   MRN: [id number]  54y/o African-American established male pt c/o R hand pain over MCP for past 2 weeks intermittently. Notices it when applying pressure to R hand, like when doing push ups on bare floor at home. No home care or OTCs for pain as resolves when he doesn't put body weight on hand.  Typically performs a set of 50 pushups daily.  Had stopped for a while due to pain.  Denied weakness, locking/popping of fingers, trauma, swelling, rash or bruising.  Unsure if xrayed in the past.  He has had other xrays performed.  Right hand dominant.  Denied any new projects at work or home e.g. lawn/home renovations/exercise programs  States rarely lifts weights but does have machine at home.  Typically walks and is active at work [employer]     Review of Systems  Constitutional: Positive for activity change. Negative for appetite change, chills, diaphoresis, fatigue and fever.  HENT: Negative for trouble swallowing and voice change.   Eyes: Negative for photophobia and visual disturbance.  Respiratory: Negative for cough.   Cardiovascular: Negative for chest pain.  Gastrointestinal: Negative for diarrhea, nausea and vomiting.  Endocrine: Negative for cold intolerance and heat intolerance.  Genitourinary: Negative for difficulty urinating.  Musculoskeletal: Negative for arthralgias, gait problem, joint swelling, neck pain and neck stiffness.  Skin: Negative for color change, rash and wound.  Allergic/Immunologic: Positive for environmental allergies. Negative for food allergies.  Neurological: Negative for tremors, syncope, weakness and numbness.  Hematological: Negative for adenopathy. Does not bruise/bleed easily.  Psychiatric/Behavioral: Negative for agitation, confusion and sleep disturbance.       Objective:   Physical Exam Vitals and nursing note reviewed.  Constitutional:      General: He is  awake. He is not in acute distress.    Appearance: Normal appearance. He is well-developed, well-groomed and normal weight. He is not ill-appearing, toxic-appearing or diaphoretic.  HENT:     Head: Normocephalic and atraumatic.     Jaw: There is normal jaw occlusion.     Salivary Glands: Right salivary gland is not diffusely enlarged. Left salivary gland is not diffusely enlarged.     Right Ear: Hearing and external ear normal.     Left Ear: Hearing and external ear normal.     Nose: Nose normal. No congestion or rhinorrhea.     Mouth/Throat:     Pharynx: Oropharynx is clear.  Eyes:     General: Lids are normal. Vision grossly intact. Gaze aligned appropriately. No visual field deficit.       Right eye: No discharge.        Left eye: No discharge.     Extraocular Movements: Extraocular movements intact.     Conjunctiva/sclera: Conjunctivae normal.     Pupils: Pupils are equal, round, and reactive to light.  Neck:     Trachea: Trachea and phonation normal.  Cardiovascular:     Rate and Rhythm: Normal rate and regular rhythm.     Pulses: Normal pulses.          Radial pulses are 2+ on the right side and 2+ on the left side.  Pulmonary:     Effort: Pulmonary effort is normal. No respiratory distress.     Breath sounds: Normal breath sounds and air entry. No stridor or transmitted upper airway sounds. No wheezing.     Comments: Wearing cloth mask due  to covid 19 pandemic; spoke full sentences without difficulty; no cough observed in exam room Abdominal:     General: Abdomen is flat.  Musculoskeletal:        General: Tenderness present. No swelling, deformity or signs of injury. Normal range of motion.     Right shoulder: No swelling, deformity, effusion, laceration or crepitus. Normal strength.     Left shoulder: No swelling, deformity, effusion, laceration or crepitus. Normal strength.     Right elbow: No swelling, deformity, effusion or lacerations. Normal range of motion.     Left  elbow: No swelling, deformity, effusion or lacerations. Normal range of motion.     Right forearm: Normal. No swelling, edema, deformity, lacerations, tenderness or bony tenderness.     Left forearm: Normal. No swelling, edema, deformity, lacerations, tenderness or bony tenderness.     Right wrist: No swelling, deformity, effusion, lacerations, tenderness, bony tenderness, snuff box tenderness or crepitus. Normal range of motion. Normal pulse.     Left wrist: No swelling, deformity, effusion, lacerations, tenderness, bony tenderness, snuff box tenderness or crepitus. Normal range of motion. Normal pulse.     Right hand: Tenderness present. No swelling, deformity or lacerations. Normal range of motion. Normal strength. Normal sensation. Normal capillary refill. Normal pulse.     Left hand: No swelling, deformity, lacerations or tenderness. Normal range of motion. Normal strength. Normal sensation. Normal capillary refill. Normal pulse.       Arms:       Hands:     Cervical back: Normal, normal range of motion and neck supple. No swelling, edema, deformity, erythema, signs of trauma, lacerations, rigidity, torticollis or crepitus. No pain with movement. Normal range of motion.     Thoracic back: Normal. No swelling, edema, deformity or signs of trauma.     Right lower leg: No edema.     Left lower leg: No edema.     Comments: Negative bilateral finkelsteins/phalen's/tinnels; negative snuff box tenderness  Normal abduction/adduction/flexion/extension fingers bilateral; hand grasp equal bilaterally 5/5  Lymphadenopathy:     Head:     Right side of head: No submental, submandibular or preauricular adenopathy.     Left side of head: No submental, submandibular or preauricular adenopathy.     Cervical: No cervical adenopathy.     Right cervical: No superficial cervical adenopathy.    Left cervical: No superficial cervical adenopathy.  Skin:    General: Skin is warm and dry.     Capillary Refill:  Capillary refill takes less than 2 seconds.     Coloration: Skin is not ashen, cyanotic, jaundiced, mottled, pale or sallow.     Findings: No abrasion, abscess, acne, bruising, burn, ecchymosis, erythema, signs of injury, laceration, lesion, petechiae, rash or wound.     Nails: There is no clubbing.  Neurological:     General: No focal deficit present.     Mental Status: He is alert and oriented to person, place, and time. Mental status is at baseline.     GCS: GCS eye subscore is 4. GCS verbal subscore is 5. GCS motor subscore is 6.     Cranial Nerves: Cranial nerves are intact. No cranial nerve deficit, dysarthria or facial asymmetry.     Sensory: Sensation is intact. No sensory deficit.     Motor: Motor function is intact. No weakness, tremor, atrophy, abnormal muscle tone or seizure activity.     Coordination: Coordination is intact. Coordination normal.     Gait: Gait is intact. Gait normal.  Comments: In/out of chair without difficulty; gait sure and steady in clinic  Psychiatric:        Attention and Perception: Attention and perception normal.        Mood and Affect: Mood and affect normal.        Speech: Speech normal.        Behavior: Behavior normal. Behavior is cooperative.        Thought Content: Thought content normal.        Cognition and Memory: Cognition and memory normal.        Judgment: Judgment normal.           Assessment & Plan:  A-right hand joint pain initial encounter  P-trial of yoga mat or folded towel on floor when performing pushups.  Consider starting at only 10 reps and increasing 10% per week to build up since only recently restarting push ups in past month doing 50 reps at one time again.  Discussed cement/tile/wood flooring compression soft tissue.  Exitcare handouts printed and given on hand pain and tendonitis.  Consider cryotherapy 15 minutes if pain not resolving with stopping activity/rest.  Follow up in 2 weeks if no improvement with plan of  care and sooner if new or worsening symptoms with plan of care e.g. swelling/bruising/rash/worsening pain/crepitus.  Differential diagnosis tendonitis, strain, contusion, arthritis, ganglion cyst. Lumbar and shoulder xrays on file show arthritis.  Discussed heat/tylenol/gentle arom to warm up joints if arthritis after prolonged static position e.g. sleeping/rest.   Patient verbalized understanding information/instructions, agreed with plan of care and had no further questions at this time.

## 2020-05-24 NOTE — Patient Instructions (Signed)
Tendinitis  Tendinitis is inflammation of a tendon. A tendon is a strong cord of tissue that connects muscle to bone. Tendinitis can affect any tendon, but it most commonly affects the:  Shoulder tendon (rotator cuff).  Ankle tendon (Achilles tendon).  Elbow tendon (triceps tendon).  Tendons in the wrist. What are the causes? This condition may be caused by:  Overusing a tendon or muscle. This is common.  Age-related wear and tear.  Injury.  Inflammatory conditions, such as arthritis.  Certain medicines. What increases the risk? You are more likely to develop this condition if you do activities that involve the same movements over and over again (repetitive motions). What are the signs or symptoms? Symptoms of this condition may include:  Pain.  Tenderness.  Mild swelling.  Decreased range of motion. How is this diagnosed? This condition is diagnosed with a physical exam. You may also have tests, such as:  Ultrasound. This uses sound waves to make an image of the inside of your body in the affected area.  MRI. How is this treated? This condition may be treated by resting, icing, applying pressure (compression), and raising (elevating) the affected area above the level of your heart. This is known as RICE therapy. Treatment may also include:  Medicines to help reduce inflammation or to help reduce pain.  Exercises or physical therapy to strengthen and stretch the tendon.  A brace or splint.  Surgery. This is rarely needed. Follow these instructions at home: If you have a splint or brace:  Wear the splint or brace as told by your health care provider. Remove it only as told by your health care provider.  Loosen the splint or brace if your fingers or toes tingle, become numb, or turn cold and blue.  Keep the splint or brace clean.  If the splint or brace is not waterproof: ? Do not let it get wet. ? Cover it with a watertight covering when you take a bath  or shower. Managing pain, stiffness, and swelling  If directed, put ice on the affected area. ? If you have a removable splint or brace, remove it as told by your health care provider. ? Put ice in a plastic bag. ? Place a towel between your skin and the bag. ? Leave the ice on for 20 minutes, 2-3 times a day.  Move the fingers or toes of the affected limb often, if this applies. This can help to prevent stiffness and lessen swelling.  If directed, raise (elevate) the affected area above the level of your heart while you are sitting or lying down.  If directed, apply heat to the affected area before you exercise. Use the heat source that your health care provider recommends, such as a moist heat pack or a heating pad.     ? Place a towel between your skin and the heat source. ? Leave the heat on for 20-30 minutes. ? Remove the heat if your skin turns bright red. This is especially important if you are unable to feel pain, heat, or cold. You may have a greater risk of getting burned. Driving  Do not drive or use heavy machinery while taking prescription pain medicine.  Ask your health care provider when it is safe to drive if you have a splint or brace on any part of your arm or leg. Activity  Rest the affected area as told by your health care provider.  Return to your normal activities as told by your health care   provider. Ask your health care provider what activities are safe for you.  Avoid using the affected area while you are experiencing symptoms of tendinitis.  Do exercises as told by your health care provider. General instructions  If you have a splint, do not put pressure on any part of the splint until it is fully hardened. This may take several hours.  Wear an elastic bandage or compression wrap only as told by your health care provider.  Take over-the-counter and prescription medicines only as told by your health care provider.  Keep all follow-up visits as told  by your health care provider. This is important. Contact a health care provider if:  Your symptoms do not improve.  You develop new, unexplained problems, such as numbness in your hands. Summary  Tendinitis is inflammation of a tendon.  You are more likely to develop this condition if you do activities that involve the same movements over and over again.  This condition may be treated by resting, icing, applying pressure (compression), and elevating the area above the level of your heart. This is known as RICE therapy.  Avoid using the affected area while you are experiencing symptoms of tendinitis. This information is not intended to replace advice given to you by your health care provider. Make sure you discuss any questions you have with your health care provider. Document Revised: 02/09/2018 Document Reviewed: 12/23/2017 Elsevier Patient Education  [place] [facility] Many things can cause hand pain. Some common causes are:  An injury.  Repeating the same movement with your hand over and over (overuse).  Osteoporosis.  Arthritis.  Lumps in the tendons or joints of the hand and wrist (ganglion cysts).  Nerve compression syndromes (carpal tunnel syndrome).  Inflammation of the tendons (tendinitis).  Infection. Follow these instructions at home: Pay attention to any changes in your symptoms. Take these actions to help with your discomfort: Managing pain, stiffness, and swelling   Take over-the-counter and prescription medicines only as told by your health care provider.  Wear a hand splint or support as told by your health care provider.  If directed, put ice on the affected area: ? Put ice in a plastic bag. ? Place a towel between your skin and the bag. ? Leave the ice on for 20 minutes, 2-3 times a day. Activity  Take breaks from repetitive activity often.  Avoid activities that make your pain worse.  Minimize stress on your hands and wrists as much  as possible.  Do stretches or exercises as told by your health care provider.  Do not do activities that make your pain worse. Contact a health care provider if:  Your pain does not get better after a few days of self-care.  Your pain gets worse.  Your pain affects your ability to do your daily activities. Get help right away if:  Your hand becomes warm, red, or swollen.  Your hand is numb or tingling.  Your hand is extremely swollen or deformed.  Your hand or fingers turn white or blue.  You cannot move your hand, wrist, or fingers. Summary  Many things can cause hand pain.  Contact your health care provider if your pain does not get better after a few days of self care.  Minimize stress on your hands and wrists as much as possible.  Do not do activities that make your pain worse. This information is not intended to replace advice given to you by your health care provider. Make sure you  discuss any questions you have with your health care provider. Document Revised: 04/30/2018 Document Reviewed: 04/30/2018 Elsevier Patient Education  [place].

## 2020-06-07 ENCOUNTER — Encounter: Payer: Self-pay | Admitting: Registered Nurse

## 2020-06-07 ENCOUNTER — Telehealth: Payer: Self-pay | Admitting: Registered Nurse

## 2020-06-07 NOTE — Telephone Encounter (Signed)
Late entry.  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  Patient decided to just stop push ups.  Did not try push ups on yoga mat or folded up towel.  Not having pain with regular work duties/ADLs or new swelling/rash.  Patient reported he will follow up if new or worsening symptoms for re-evaluation.  Patient had no further questions at this time and verbalized understanding information/instructions.

## 2020-06-15 ENCOUNTER — Encounter: Payer: Self-pay | Admitting: Registered Nurse

## 2020-06-15 ENCOUNTER — Other Ambulatory Visit: Payer: Self-pay

## 2020-06-15 ENCOUNTER — Ambulatory Visit: Payer: PRIVATE HEALTH INSURANCE | Admitting: Registered Nurse

## 2020-06-15 VITALS — BP 173/80 | HR 91 | Temp 98.1°F | Resp 18 | Ht 67.0 in | Wt 151.0 lb

## 2020-06-15 DIAGNOSIS — R202 Paresthesia of skin: Secondary | ICD-10-CM | POA: Diagnosis not present

## 2020-06-15 LAB — IRON,TIBC AND FERRITIN PANEL

## 2020-06-15 LAB — CBC: RBC: 5.07 x10E6/uL (ref 4.14–5.80)

## 2020-06-15 NOTE — Patient Instructions (Signed)
° ° ° °  If you have lab work done today you will be contacted with your lab results within the next 2 weeks.  If you have not heard from us then please contact us. The fastest way to get your results is to register for My Chart. ° ° °IF you received an x-ray today, you will receive an invoice from [facility]. Please contact [facility] at [phone] with questions or concerns regarding your invoice.  ° °IF you received labwork today, you will receive an invoice from [facility]. Please contact [facility] at [phone] with questions or concerns regarding your invoice.  ° °Our billing staff will not be able to assist you with questions regarding bills from these companies. ° °You will be contacted with the lab results as soon as they are available. The fastest way to get your results is to activate your My Chart account. Instructions are located on the last page of this paperwork. If you have not heard from us regarding the results in 2 weeks, please contact this office. °  ° ° ° °

## 2020-06-15 NOTE — Progress Notes (Signed)
Acute Office Visit  Subjective:    Patient ID: Grant Wilson, male    DOB: 04-02-1966, 54 y.o.   MRN: [id number]  Chief Complaint  Patient presents with  . Hand Pain    Patient states he has been having some hand pain for about one month he does not feel pain until he is using his hands for something or adding pressure.  . Foot Pain    Patient has also been having some burning and tingling sensation in feet for a few weeks,    HPI Patient is in today for numbness and pain in hands and feet. On and off for about a month This is not the first time this has happened - has been seen in our office and by others for this in the past.  Usually goes away with some muscle relaxer use - has been told he may have some arthritis in back contributing to this No weakness, no discoloration, no swelling. Worse in morning, worse with pressure on palms and use of hands and feet Notes the only major change he has made is that he has been eating a vegetarian diet for around 1 year now.  Denies family hx of autoimmunity to his knowledge.   Past Medical History:  Diagnosis Date  . Allergy   . GERD (gastroesophageal reflux disease)   . Hepatitis B   . Hyperlipidemia   . Prediabetes     Past Surgical History:  Procedure Laterality Date  . NO PAST SURGERIES      No family history on file.  Social History   Socioeconomic History  . Marital status: Single    Spouse name: n/a  . Number of children: 0  . Years of education: associates  . Highest education level: Not on file  Occupational History  . Occupation: IT department    Comment: [employer]  Tobacco Use  . Smoking status: Never Smoker  . Smokeless tobacco: Never Used  Vaping Use  . Vaping Use: Never used  Substance and Sexual Activity  . Alcohol use: No    Alcohol/week: 0.0 standard drinks  . Drug use: No  . Sexual activity: Yes    Partners: Female    Birth control/protection: Condom  Other Topics Concern  . Not on file   Social History Narrative   From [place]. Came to the [place] in 1997.   Lives alone.   No children   Social Determinants of Health   Financial Resource Strain:   . Difficulty of Paying Living Expenses: Not on file  Food Insecurity:   . Worried About Charity fundraiser in the Last Year: Not on file  . Ran Out of Food in the Last Year: Not on file  Transportation Needs:   . Lack of Transportation (Medical): Not on file  . Lack of Transportation (Non-Medical): Not on file  Physical Activity:   . Days of Exercise per Week: Not on file  . Minutes of Exercise per Session: Not on file  Stress:   . Feeling of Stress : Not on file  Social Connections:   . Frequency of Communication with Friends and Family: Not on file  . Frequency of Social Gatherings with Friends and Family: Not on file  . Attends Religious Services: Not on file  . Active Member of Clubs or Organizations: Not on file  . Attends Archivist Meetings: Not on file  . Marital Status: Not on file  Intimate Partner Violence:   . Fear  of Current or Ex-Partner: Not on file  . Emotionally Abused: Not on file  . Physically Abused: Not on file  . Sexually Abused: Not on file    Outpatient Medications Prior to Visit  Medication Sig Dispense Refill  . tenofovir (VIREAD) 300 MG tablet Take 300 mg by mouth daily. Reported on 01/11/2016    . atorvastatin (LIPITOR) 40 MG tablet Take 1 tablet (40 mg total) by mouth daily. (Patient not taking: Reported on 05/24/2020) 90 tablet 1  . loratadine (CLARITIN) 10 MG tablet Take 1 tablet (10 mg total) by mouth daily. (Patient not taking: Reported on 11/11/2019) 30 tablet 2  . predniSONE (STERAPRED UNI-PAK 21 TAB) 10 MG (21) TBPK tablet Take by mouth daily. Take as directed. (Patient not taking: Reported on 11/11/2019) 21 tablet 0   No facility-administered medications prior to visit.    No Known Allergies  Review of Systems Per hpi      Objective:    Physical Exam Vitals and  nursing note reviewed.  Constitutional:      Appearance: Normal appearance.  Cardiovascular:     Rate and Rhythm: Normal rate and regular rhythm.  Musculoskeletal:        General: No swelling or tenderness. Normal range of motion.     Comments: Negative tinel bilaterally  Skin:    General: Skin is warm and dry.     Capillary Refill: Capillary refill takes less than 2 seconds.     Coloration: Skin is not jaundiced or pale.     Findings: No bruising, erythema, lesion or rash.  Neurological:     General: No focal deficit present.     Mental Status: He is alert and oriented to person, place, and time. Mental status is at baseline.  Psychiatric:        Mood and Affect: Mood normal.        Behavior: Behavior normal.        Thought Content: Thought content normal.        Judgment: Judgment normal.     BP (!) 173/80   Pulse 91   Temp 98.1 F (36.7 C) (Temporal)   Resp 18   Ht 5\' 7"  (1.702 m)   Wt 151 lb (68.5 kg)   SpO2 97%   BMI 23.65 kg/m  Wt Readings from Last 3 Encounters:  06/15/20 151 lb (68.5 kg)  11/11/19 157 lb (71.2 kg)  11/08/18 161 lb (73 kg)    There are no preventive care reminders to display for this patient.  There are no preventive care reminders to display for this patient.   Lab Results  Component Value Date   TSH 0.940 11/11/2019   Lab Results  Component Value Date   WBC 3.0 (L) 11/11/2019   HGB 15.4 11/11/2019   HCT 44.8 11/11/2019   MCV 95 11/11/2019   PLT 218 11/11/2019   Lab Results  Component Value Date   NA 137 11/11/2019   K 4.2 11/11/2019   CO2 22 11/11/2019   GLUCOSE 97 11/11/2019   BUN 4 (L) 11/11/2019   CREATININE 1.01 11/11/2019   BILITOT 0.5 11/11/2019   ALKPHOS 52 11/11/2019   AST 27 11/11/2019   ALT 18 11/11/2019   PROT 7.0 11/11/2019   ALBUMIN 4.8 11/11/2019   CALCIUM 9.5 11/11/2019   Lab Results  Component Value Date   CHOL 212 (H) 11/11/2019   Lab Results  Component Value Date   HDL 53 11/11/2019   Lab  Results  Component  Value Date   LDLCALC 148 (H) 11/11/2019   Lab Results  Component Value Date   TRIG 60 11/11/2019   Lab Results  Component Value Date   CHOLHDL 4.0 11/11/2019   Lab Results  Component Value Date   HGBA1C 5.7 (H) 11/11/2019       Assessment & Plan:   Problem List Items Addressed This Visit    None    Visit Diagnoses    Paresthesias    -  Primary   Relevant Orders   Vitamin D, 25-hydroxy   Vitamin B12   Iron, TIBC and Ferritin Panel   CBC   ANA       No orders of the defined types were placed in this encounter.  PLAN  No acute findings on exam. Unable to elicit symptoms  Concern for a deficiency or autoimmunity - this does not seem to be something that has been looked at too recently  Will send labs, follow up as warranted  Patient encouraged to call clinic with any questions, comments, or concerns.  Maximiano Coss, NP

## 2020-06-17 LAB — IRON,TIBC AND FERRITIN PANEL
Ferritin: 61 ng/mL (ref 30–400)
Iron Saturation: 26 % (ref 15–55)
Iron: 98 ug/dL (ref 38–169)
Total Iron Binding Capacity: 372 ug/dL (ref 250–450)

## 2020-06-17 LAB — CBC
Hematocrit: 48.1 % (ref 37.5–51.0)
Hemoglobin: 16 g/dL (ref 13.0–17.7)
MCH: 31.6 pg (ref 26.6–33.0)
MCHC: 33.3 g/dL (ref 31.5–35.7)
MCV: 95 fL (ref 79–97)
Platelets: 199 10*3/uL (ref 150–450)
RDW: 13.5 % (ref 11.6–15.4)
WBC: 2.9 10*3/uL — ABNORMAL LOW (ref 3.4–10.8)

## 2020-06-17 LAB — ANA: Anti Nuclear Antibody (ANA): POSITIVE — AB

## 2020-06-17 LAB — VITAMIN B12: Vitamin B-12: 1242 pg/mL (ref 232–1245)

## 2020-06-17 LAB — VITAMIN D 25 HYDROXY (VIT D DEFICIENCY, FRACTURES): Vit D, 25-Hydroxy: 22.6 ng/mL — ABNORMAL LOW (ref 30.0–100.0)

## 2020-07-02 ENCOUNTER — Other Ambulatory Visit: Payer: Self-pay | Admitting: Registered Nurse

## 2020-07-02 DIAGNOSIS — R768 Other specified abnormal immunological findings in serum: Secondary | ICD-10-CM

## 2020-08-23 NOTE — Progress Notes (Signed)
Office Visit Note  Patient: Grant Wilson             Date of Birth: 1965-10-03           MRN: [id number]             PCP: Janeece Agee, NP Referring: Janeece Agee, NP Visit Date: 08/24/2020 Occupation: [employer]  Subjective:  New Patient (Initial Visit)   History of Present Illness: Grant Wilson is a 55 y.o. male with history of hepatitis B and GERD here for evaluation of joint pains and positive ANA. He developed pain in the right wrist a few months ago most noticeable when doing pushups. The pain worsened with use and improved at rest. He did not notice significant joint swelling. He has occasional numbness in the right arm he thinks can come from sleeping or lying on that side. Lab tests were positive for ANA and also has vitamin D deficiency. He says he just started taking a supplement recently. Review of previous labs also shows chronic mild leukopenia. He denies any alopecia, skin rashes, eye inflammation, oral ulcers, lymphadenopathy, pleurisy, raynaud's phenomenon, or history of blood clots.  Labs reviewed 05/2020 ANA positive Vitamin D 22.6 WBC chronic mild leukopenia   Activities of Daily Living:  Patient reports morning stiffness for 0 minutes.   Patient Denies nocturnal pain.  Difficulty dressing/grooming: Denies Difficulty climbing stairs: Denies Difficulty getting out of chair: Denies Difficulty using hands for taps, buttons, cutlery, and/or writing: Denies  Review of Systems  Constitutional: Negative for fatigue.  HENT: Negative for mouth sores, mouth dryness and nose dryness.   Eyes: Negative for pain, itching, visual disturbance and dryness.  Respiratory: Negative for cough, hemoptysis, shortness of breath and difficulty breathing.   Cardiovascular: Negative for chest pain, palpitations and swelling in legs/feet.  Gastrointestinal: Negative for abdominal pain, blood in stool, constipation and diarrhea.  Endocrine: Negative for increased urination.   Genitourinary: Negative for painful urination.  Musculoskeletal: Positive for arthralgias and joint pain. Negative for joint swelling, myalgias, muscle weakness, morning stiffness, muscle tenderness and myalgias.  Skin: Negative for color change, rash and redness.  Allergic/Immunologic: Negative for susceptible to infections.  Neurological: Negative for dizziness, numbness, headaches, memory loss and weakness.  Hematological: Negative for swollen glands.  Psychiatric/Behavioral: Negative for confusion and sleep disturbance.    PMFS History:  Patient Active Problem List   Diagnosis Date Noted  . Vitamin D deficiency 08/24/2020  . Pain in right hand 08/24/2020  . Positive ANA (antinuclear antibody) 08/24/2020  . Right wrist pain 11/15/2018  . Elevated LDH 11/15/2018  . Prediabetes 10/24/2017  . Gastroesophageal reflux disease without esophagitis 11/08/2016  . DDD (degenerative disc disease), lumbar 01/11/2016  . Chronic low back pain 07/22/2012  . High cholesterol 05/12/2012  . Seasonal allergies 05/12/2012  . Hepatitis B 09/22/2011    Past Medical History:  Diagnosis Date  . Allergy   . GERD (gastroesophageal reflux disease)   . Hepatitis B   . Hyperlipidemia   . Prediabetes     History reviewed. No pertinent family history. Past Surgical History:  Procedure Laterality Date  . NO PAST SURGERIES     Social History   Social History Narrative   From [place]. Came to the [place] in 1997.   Lives alone.   No children   Immunization History  Administered Date(s) Administered  . Hepatitis A 05/09/2007, 11/07/2007  . Influenza Split 06/24/2012  . Influenza,inj,Quad PF,6+ Mos 05/25/2013, 05/24/2016  . Influenza-Unspecified 05/23/2015,  06/08/2017  . Tdap 12/08/2014     Objective: Vital Signs: BP 132/78 (BP Location: Right Arm, Patient Position: Sitting, Cuff Size: Normal)   Pulse 85   Ht 5' 6.25" (1.683 m)   Wt 151 lb 12.8 oz (68.9 kg)   BMI 24.32 kg/m    Physical  Exam HENT:     Right Ear: External ear normal.     Left Ear: External ear normal.     Mouth/Throat:     Mouth: Mucous membranes are moist.     Pharynx: Oropharynx is clear.  Eyes:     Conjunctiva/sclera: Conjunctivae normal.  Cardiovascular:     Rate and Rhythm: Normal rate and regular rhythm.  Pulmonary:     Effort: Pulmonary effort is normal.     Breath sounds: Normal breath sounds.  Skin:    General: Skin is warm and dry.     Findings: No rash.  Neurological:     General: No focal deficit present.     Mental Status: He is alert.  Psychiatric:        Mood and Affect: Mood normal.     Musculoskeletal Exam:  Neck full range of motion no tenderness Shoulder, elbow, wrist, fingers full range of motion no tenderness or swelling Knees, ankles, MTPs full range of motion no tenderness or swelling  Investigation: No additional findings.  Imaging: No results found.  Recent Labs: Lab Results  Component Value Date   WBC 2.9 (L) 06/15/2020   HGB 16.0 06/15/2020   PLT 199 06/15/2020   NA 137 11/11/2019   K 4.2 11/11/2019   CL 100 11/11/2019   CO2 22 11/11/2019   GLUCOSE 97 11/11/2019   BUN 4 (L) 11/11/2019   CREATININE 1.01 11/11/2019   BILITOT 0.5 11/11/2019   ALKPHOS 52 11/11/2019   AST 27 11/11/2019   ALT 18 11/11/2019   PROT 7.0 11/11/2019   ALBUMIN 4.8 11/11/2019   CALCIUM 9.5 11/11/2019   GFRAA 97 11/11/2019    Speciality Comments: No specialty comments available.  Procedures:  No procedures performed Allergies: Patient has no known allergies.   Assessment / Plan:     Visit Diagnoses: Positive ANA (antinuclear antibody)  Positive ANA test but no clinical criteria of systemic connective tissue disease seen on exam today or by history and review of systems.  He has history of hepatitis B not commonly associated as a cause of incidental positive ANA no other clear history for this.  Based on lack of symptoms recommend against additional testing rheumatology  work-up at this time.  Recommend he can follow-up if symptoms are recurrent worsening or has new problems.  Vitamin D deficiency  Vitamin D 22.6 on labs from October.  Discussed recommendation to supplement this he has had several cracked teeth but no history of fractures or obvious related symptoms at this time.  Encouraged supplementation at least 800 international units daily recommended can have retesting in 3 months after starting daily supplement.  Pain in right hand  Currently no symptoms or changes seen on exam.  Recommended he use a thick padded mat or get some type of secure handles for push-ups to reduce wrist strain and pressure. No findings for carpal tunnel or significant OA. Can consider repeat work-up if this is becoming worse or more persistent.  Orders: No orders of the defined types were placed in this encounter.  No orders of the defined types were placed in this encounter.   Follow-Up Instructions: Return if symptoms worsen or fail  to improve.   Collier Salina, MD  Note - This record has been created using [employer].  Chart creation errors have been sought, but may not always  have been located. Such creation errors do not reflect on  the standard of medical care.

## 2020-08-24 ENCOUNTER — Other Ambulatory Visit: Payer: Self-pay

## 2020-08-24 ENCOUNTER — Encounter: Payer: Self-pay | Admitting: Internal Medicine

## 2020-08-24 ENCOUNTER — Ambulatory Visit: Payer: PRIVATE HEALTH INSURANCE | Admitting: Internal Medicine

## 2020-08-24 VITALS — BP 132/78 | HR 85 | Ht 66.25 in | Wt 151.8 lb

## 2020-08-24 DIAGNOSIS — E559 Vitamin D deficiency, unspecified: Secondary | ICD-10-CM | POA: Diagnosis not present

## 2020-08-24 DIAGNOSIS — R7689 Other specified abnormal immunological findings in serum: Secondary | ICD-10-CM | POA: Insufficient documentation

## 2020-08-24 DIAGNOSIS — M79641 Pain in right hand: Secondary | ICD-10-CM | POA: Insufficient documentation

## 2020-08-24 DIAGNOSIS — R768 Other specified abnormal immunological findings in serum: Secondary | ICD-10-CM | POA: Diagnosis not present

## 2020-08-24 NOTE — Patient Instructions (Signed)
I recommend taking vitamin D3 supplement at least 800 IUs or 20 mcg daily for your low vitamin D.  I recommend trying to add a padded material or using a handle or grip for pushups to decrease pressure on the wrist.  I do not see evidence of inflammation related to an autoimmune disease.   Vitamin D Deficiency Vitamin D deficiency is when your body does not have enough vitamin D. Vitamin D is important to your body because:  It helps your body use other minerals.  It helps to keep your bones strong and healthy.  It may help to prevent some diseases.  It helps your heart and other muscles work well. Not getting enough vitamin D can make your bones soft. It can also cause other health problems. What are the causes? This condition may be caused by:  Not eating enough foods that contain vitamin D.  Not getting enough sun.  Having diseases that make it hard for your body to absorb vitamin D.  Having a surgery in which a part of the stomach or a part of the small intestine is removed.  Having kidney disease or liver disease. What increases the risk? You are more likely to get this condition if:  You are older.  You do not spend much time outdoors.  You live in a nursing home.  You have had broken bones.  You have weak or thin bones (osteoporosis).  You have a disease or condition that changes how your body absorbs vitamin D.  You have dark skin.  You take certain medicines.  You are overweight or obese. What are the signs or symptoms?  In mild cases, there may not be any symptoms. If the condition is very bad, symptoms may include: ? Bone pain. ? Muscle pain. ? Falling often. ? Broken bones caused by a minor injury. How is this treated? Treatment may include taking supplements as told by your doctor. Your doctor will tell you what dose is best for you. Supplements may include:  Vitamin D.  Calcium. Follow these instructions at home: Eating and  drinking   Eat foods that contain vitamin D, such as: ? Dairy products, cereals, or juices with added vitamin D. Check the label. ? Fish, such as salmon or trout. ? Eggs. ? Oysters. ? Mushrooms. The items listed above may not be a complete list of what you can eat and drink. Contact a dietitian for more options. General instructions  Take medicines and supplements only as told by your doctor.  Get regular, safe exposure to natural sunlight.  Do not use a tanning bed.  Maintain a healthy weight. Lose weight if needed.  Keep all follow-up visits as told by your doctor. This is important. How is this prevented?  You can get vitamin D by: ? Eating foods that naturally contain vitamin D. ? Eating or drinking products that have vitamin D added to them, such as cereals, juices, and milk. ? Taking vitamin D or a multivitamin that contains vitamin D. ? Being in the sun. Your body makes vitamin D when your skin is exposed to sunlight. Your body changes the sunlight into a form of the vitamin that it can use. Contact a doctor if:  Your symptoms do not go away.  You feel sick to your stomach (nauseous).  You throw up (vomit).  You poop less often than normal, or you have trouble pooping (constipation). Summary  Vitamin D deficiency is when your body does not have enough vitamin  D.  Vitamin D helps to keep your bones strong and healthy.  This condition is often treated by taking a supplement.  Your doctor will tell you what dose is best for you. This information is not intended to replace advice given to you by your health care provider. Make sure you discuss any questions you have with your health care provider. Document Revised: 04/12/2018 Document Reviewed: 04/12/2018 Elsevier Patient Education  Ken Caryl.

## 2021-02-12 ENCOUNTER — Telehealth: Payer: Self-pay | Admitting: *Deleted

## 2021-02-12 DIAGNOSIS — U071 COVID-19: Secondary | ICD-10-CM

## 2021-02-12 NOTE — Telephone Encounter (Signed)
Pt in to clinic reporting his friend that he was with last on Friday 6/25 called him just now and advised that he tested positive for Covid. Pt is unvaccinated and asymptomatic.  Exposure Day 0 6/25 Day 5 6/29 Testing 6/29 at [facility], rapid NAAT test at 1130. Pt is already scheduled off Thursday and Friday. Not scheduled to be back onsite until 7/5. Advised strict mask use days 6-10 regardless of test results. Follow up tomorrow for results.

## 2021-02-13 NOTE — Telephone Encounter (Signed)
Reviewed RN Haley note agreed with plan of care. 

## 2021-02-14 NOTE — Telephone Encounter (Signed)
Spoke with pt by phone. He reports he has been thinking about last few days and realized he was exposed Friday and began having a headache on Saturday that was intermittent all weekend but resolved during the week. Feels well now with no sx.   Resetting quarantine timeline due to sx.  Day 0 6/25 Day 5 6/30 RTW 7/1 or next scheduled workday of 7/5 with strict mask use thru 7/5.  Rapid test yesterday did come back positive. HR made aware for contract tracing purposes.

## 2021-02-15 NOTE — Telephone Encounter (Signed)
Reviewed RN Hildred Alamin note positive test agreed with plan of care and patient symptomatic.

## 2021-02-17 NOTE — Telephone Encounter (Signed)
Late Entry spoke with patient via telephone 02/16/21 last weekend  had mild headache prior to positive covid test resolved.  Feeling well no questions or concerns.  RTW onsite 7/5 Day 10 discussed last day of strict mask wear and not eating in employee lunch room.  Patient to sanitize high touch surfaces at home daily.  May eat in meditation room, outside or his car on Day 10.  Use straw under mask to drink beverages when onsite do not re move mask.  Patient A&OX3 spoke full sentences without difficulty no cough/nasal sniffing or throat clearing noted during 4 minute telephone call.  HR notified may return onsite 02/19/21 with strict mask wear through 02/19/21.  Patient verbalized understanding information/instructions, agreed with plan of care and had no further questions at this time.

## 2021-02-19 ENCOUNTER — Encounter: Payer: Self-pay | Admitting: *Deleted

## 2021-02-19 NOTE — Telephone Encounter (Signed)
Reviewed RN Hildred Alamin note patient returned onsite as expected last day of strict mask wear and no complaints/concerns.

## 2021-02-19 NOTE — Telephone Encounter (Signed)
Spoke with pt by phone. He did RTW today 7/5 as expected. Today Day 10 as well. Feels well. No c/o. Closing encounter.

## 2021-05-06 ENCOUNTER — Ambulatory Visit: Payer: Self-pay | Admitting: *Deleted

## 2021-05-06 ENCOUNTER — Other Ambulatory Visit: Payer: Self-pay

## 2021-05-06 VITALS — BP 158/83 | HR 74

## 2021-05-06 DIAGNOSIS — Z0130 Encounter for examination of blood pressure without abnormal findings: Secondary | ICD-10-CM

## 2021-05-06 NOTE — Progress Notes (Signed)
Routine BP check per pt request.

## 2021-06-04 ENCOUNTER — Ambulatory Visit: Payer: Self-pay | Admitting: Registered Nurse

## 2021-06-04 ENCOUNTER — Encounter: Payer: Self-pay | Admitting: Registered Nurse

## 2021-06-04 ENCOUNTER — Other Ambulatory Visit: Payer: Self-pay

## 2021-06-04 VITALS — BP 147/83 | HR 71 | Temp 96.8°F | Wt 143.0 lb

## 2021-06-04 DIAGNOSIS — R1011 Right upper quadrant pain: Secondary | ICD-10-CM

## 2021-06-04 NOTE — Patient Instructions (Signed)
Abdominal Pain, Adult Pain in the abdomen (abdominal pain) can be caused by many things. Often, abdominal pain is not serious and it gets better with no treatment or by being treated at home. However, sometimes abdominal pain is serious. Your health care provider will ask questions about your medical history and do a physical exam to try to determine the cause of your abdominal pain. Follow these instructions at home: Medicines Take over-the-counter and prescription medicines only as told by your health care provider. Do not take a laxative unless told by your health care provider. General instructions  Watch your condition for any changes. Drink enough fluid to keep your urine pale yellow. Keep all follow-up visits as told by your health care provider. This is important. Contact a health care provider if: Your abdominal pain changes or gets worse. You are not hungry or you lose weight without trying. You are constipated or have diarrhea for more than 2-3 days. You have pain when you urinate or have a bowel movement. Your abdominal pain wakes you up at night. Your pain gets worse with meals, after eating, or with certain foods. You are vomiting and cannot keep anything down. You have a fever. You have blood in your urine. Get help right away if: Your pain does not go away as soon as your health care provider told you to expect. You cannot stop vomiting. Your pain is only in areas of the abdomen, such as the right side or the left lower portion of the abdomen. Pain on the right side could be caused by appendicitis. You have bloody or black stools, or stools that look like tar. You have severe pain, cramping, or bloating in your abdomen. You have signs of dehydration, such as: Dark urine, very little urine, or no urine. Cracked lips. Dry mouth. Sunken eyes. Sleepiness. Weakness. You have trouble breathing or chest pain. Summary Often, abdominal pain is not serious and it gets  better with no treatment or by being treated at home. However, sometimes abdominal pain is serious. Watch your condition for any changes. Take over-the-counter and prescription medicines only as told by your health care provider. Contact a health care provider if your abdominal pain changes or gets worse. Get help right away if you have severe pain, cramping, or bloating in your abdomen. This information is not intended to replace advice given to you by your health care provider. Make sure you discuss any questions you have with your health care provider. Document Revised: 09/23/2019 Document Reviewed: 12/13/2018 Elsevier Patient Education  2022 [place]. Diverticulosis Diverticulosis is a condition that develops when small pouches (diverticula) form in the wall of the large intestine (colon). The colon is where water is absorbed and stool (feces) is formed. The pouches form when the inside layer of the colon pushes through weak spots in the outer layers of the colon. You may have a few pouches or many of them. The pouches usually do not cause problems unless they become inflamed or infected. When this happens, the condition is called diverticulitis. What are the causes? The cause of this condition is not known. What increases the risk? The following factors may make you more likely to develop this condition: Being older than age 43. Your risk for this condition increases with age. Diverticulosis is rare among people younger than age 60. By age 4, many people have it. Eating a low-fiber diet. Having frequent constipation. Being overweight. Not getting enough exercise. Smoking. Taking over-the-counter pain medicines, like aspirin and  ibuprofen. Having a family history of diverticulosis. What are the signs or symptoms? In most people, there are no symptoms of this condition. If you do have symptoms, they may include: Bloating. Cramps in the abdomen. Constipation or diarrhea. Pain in  the lower left side of the abdomen. How is this diagnosed? Because diverticulosis usually has no symptoms, it is most often diagnosed during an exam for other colon problems. The condition may be diagnosed by: Using a flexible scope to examine the colon (colonoscopy). Taking an X-ray of the colon after dye has been put into the colon (barium enema). Having a CT scan. How is this treated? You may not need treatment for this condition. Your health care provider may recommend treatment to prevent problems. You may need treatment if you have symptoms or if you previously had diverticulitis. Treatment may include: Eating a high-fiber diet. Taking a fiber supplement. Taking a live bacteria supplement (probiotic). Taking medicine to relax your colon. Follow these instructions at home: Medicines Take over-the-counter and prescription medicines only as told by your health care provider. If told by your health care provider, take a fiber supplement or probiotic. Constipation prevention Your condition may cause constipation. To prevent or treat constipation, you may need to: Drink enough fluid to keep your urine pale yellow. Take over-the-counter or prescription medicines. Eat foods that are high in fiber, such as beans, whole grains, and fresh fruits and vegetables. Limit foods that are high in fat and processed sugars, such as fried or sweet foods.  General instructions Try not to strain when you have a bowel movement. Keep all follow-up visits as told by your health care provider. This is important. Contact a health care provider if you: Have pain in your abdomen. Have bloating. Have cramps. Have not had a bowel movement in 3 days. Get help right away if: Your pain gets worse. Your bloating becomes very bad. You have a fever or chills, and your symptoms suddenly get worse. You vomit. You have bowel movements that are bloody or black. You have bleeding from your  rectum. Summary Diverticulosis is a condition that develops when small pouches (diverticula) form in the wall of the large intestine (colon). You may have a few pouches or many of them. This condition is most often diagnosed during an exam for other colon problems. Treatment may include increasing the fiber in your diet, taking supplements, or taking medicines. This information is not intended to replace advice given to you by your health care provider. Make sure you discuss any questions you have with your health care provider. Document Revised: 03/03/2019 Document Reviewed: 03/03/2019 Elsevier Patient Education  [place]. Diverticulitis Diverticulitis is infection or inflammation of small pouches (diverticula) in the colon that form due to a condition called diverticulosis. Diverticula can trap stool (feces) and bacteria, causing infection and inflammation. Diverticulitis may cause severe stomach pain and diarrhea. It may lead to tissue damage in the colon that causes bleeding or blockage. The diverticula may also burst (rupture) and cause infected stool to enter other areas of the abdomen. What are the causes? This condition is caused by stool becoming trapped in the diverticula, which allows bacteria to grow in the diverticula. This leads to inflammation and infection. What increases the risk? You are more likely to develop this condition if you have diverticulosis. The risk increases if you: Are overweight or obese. Do not get enough exercise. Drink alcohol. Use tobacco products. Eat a diet that has a lot of red meat  such as beef, pork, or lamb. Eat a diet that does not include enough fiber. High-fiber foods include fruits, vegetables, beans, nuts, and whole grains. Are over 81 years of age. What are the signs or symptoms? Symptoms of this condition may include: Pain and tenderness in the abdomen. The pain is normally located on the left side of the abdomen, but it may occur  in other areas. Fever and chills. Nausea. Vomiting. Cramping. Bloating. Changes in bowel routines. Blood in your stool. How is this diagnosed? This condition is diagnosed based on: Your medical history. A physical exam. Tests to make sure there is nothing else causing your condition. These tests may include: Blood tests. Urine tests. CT scan of the abdomen. How is this treated? Most cases of this condition are mild and can be treated at home. Treatment may include: Taking over-the-counter pain medicines. Following a clear liquid diet. Taking antibiotic medicines by mouth. Resting. More severe cases may need to be treated at a hospital. Treatment may include: Not eating or drinking. Taking prescription pain medicine. Receiving antibiotic medicines through an IV. Receiving fluids and nutrition through an IV. Surgery. When your condition is under control, your health care provider may recommend that you have a colonoscopy. This is an exam to look at the entire large intestine. During the exam, a lubricated, bendable tube is inserted into the anus and then passed into the rectum, colon, and other parts of the large intestine. A colonoscopy can show how severe your diverticula are and whether something else may be causing your symptoms. Follow these instructions at home: Medicines Take over-the-counter and prescription medicines only as told by your health care provider. These include fiber supplements, probiotics, and stool softeners. If you were prescribed an antibiotic medicine, take it as told by your health care provider. Do not stop taking the antibiotic even if you start to feel better. Ask your health care provider if the medicine prescribed to you requires you to avoid driving or using machinery. Eating and drinking  Follow a full liquid diet or another diet as directed by your health care provider. After your symptoms improve, your health care provider may tell you to change  your diet. He or she may recommend that you eat a diet that contains at least 25 grams (25 g) of fiber daily. Fiber makes it easier to pass stool. Healthy sources of fiber include: Berries. One cup contains 4-8 grams of fiber. Beans or lentils. One-half cup contains 5-8 grams of fiber. Green vegetables. One cup contains 4 grams of fiber. Avoid eating red meat. General instructions Do not use any products that contain nicotine or tobacco, such as cigarettes, e-cigarettes, and chewing tobacco. If you need help quitting, ask your health care provider. Exercise for at least 30 minutes, 3 times each week. You should exercise hard enough to raise your heart rate and break a sweat. Keep all follow-up visits as told by your health care provider. This is important. You may need to have a colonoscopy. Contact a health care provider if: Your pain does not improve. Your bowel movements do not return to normal. Get help right away if: Your pain gets worse. Your symptoms do not get better with treatment. Your symptoms suddenly get worse. You have a fever. You vomit more than one time. You have stools that are bloody, black, or tarry. Summary Diverticulitis is infection or inflammation of small pouches (diverticula) in the colon that form due to a condition called diverticulosis. Diverticula can trap stool (  feces) and bacteria, causing infection and inflammation. You are at higher risk for this condition if you have diverticulosis and you eat a diet that does not include enough fiber. Most cases of this condition are mild and can be treated at home. More severe cases may need to be treated at a hospital. When your condition is under control, your health care provider may recommend that you have an exam called a colonoscopy. This exam can show how severe your diverticula are and whether something else may be causing your symptoms. Keep all follow-up visits as told by your health care provider. This is  important. This information is not intended to replace advice given to you by your health care provider. Make sure you discuss any questions you have with your health care provider. Document Revised: 05/16/2019 Document Reviewed: 05/16/2019 Elsevier Patient Education  2022 [employer].

## 2021-06-04 NOTE — Progress Notes (Signed)
Subjective:    Patient ID: Grant Wilson, male    DOB: 1966-06-29, 55 y.o.   MRN: [id number]  55y/o African American established male pt reports intermittent feeling of warmth over RUQ of abdomen. Not constant. Has noticed it when lying on right side but also at other times. Has been present for at least 1.5 years. Denies abd pain, n/v/d, change in frequency of stools (twice a day) or stool color, etc.  Patient reported colonoscopy in the past year was told polyp and hemorrhoids [facility] and repeat in 2 years.  Unable to see results in my chart.  Had noticed heartburn mainly reflux into esophagus no pain when he ate meat so has transitioned to mostly vegetarian diet (rice/veggies/beans) and lost a little weight - 7 lbs in the last year also.  Has noticed with diet change stools not as large caliber but still same color tan and frequency.  Notices occasionally stools may be a little looser but denied watery diarrhea  Patient denied abdomen pain with wearing seatbelt, driving over speed bumps in car.     Review of Systems  Constitutional:  Negative for activity change, appetite change, chills, diaphoresis, fatigue, fever and unexpected weight change.  HENT:  Negative for trouble swallowing and voice change.   Eyes:  Negative for photophobia and visual disturbance.  Respiratory:  Negative for cough, shortness of breath, wheezing and stridor.   Cardiovascular:  Negative for chest pain, palpitations and leg swelling.  Gastrointestinal:  Positive for abdominal pain. Negative for abdominal distention, anal bleeding, blood in stool, constipation, diarrhea, nausea and vomiting.  Endocrine: Negative for cold intolerance and heat intolerance.  Genitourinary:  Negative for difficulty urinating.  Musculoskeletal:  Negative for back pain, gait problem, myalgias, neck pain and neck stiffness.  Skin:  Negative for rash.  Allergic/Immunologic: Positive for environmental allergies. Negative for food allergies.   Neurological:  Negative for dizziness, tremors, seizures, syncope, facial asymmetry, speech difficulty, weakness, light-headedness, numbness and headaches.  Hematological:  Negative for adenopathy. Does not bruise/bleed easily.  Psychiatric/Behavioral:  Negative for agitation, confusion and sleep disturbance.       Objective:   Physical Exam Vitals and nursing note reviewed.  Constitutional:      General: He is awake. He is not in acute distress.    Appearance: Normal appearance. He is well-developed, well-groomed and normal weight. He is not ill-appearing, toxic-appearing or diaphoretic.  HENT:     Head: Normocephalic and atraumatic.     Jaw: There is normal jaw occlusion.     Salivary Glands: Right salivary gland is not diffusely enlarged. Left salivary gland is not diffusely enlarged.     Right Ear: Hearing and external ear normal.     Left Ear: Hearing and external ear normal.     Nose: Nose normal. No congestion or rhinorrhea.     Mouth/Throat:     Lips: Pink. No lesions.     Mouth: Mucous membranes are moist. No angioedema.     Dentition: No dental abscesses or gum lesions.     Tongue: No lesions. Tongue does not deviate from midline.     Palate: No mass and lesions.     Pharynx: Oropharynx is clear. Uvula midline. No uvula swelling.  Eyes:     General: Lids are normal. Vision grossly intact. Gaze aligned appropriately. No allergic shiner or scleral icterus.       Right eye: No discharge.        Left eye: No discharge.  Extraocular Movements: Extraocular movements intact.     Conjunctiva/sclera: Conjunctivae normal.     Pupils: Pupils are equal, round, and reactive to light.  Neck:     Trachea: Trachea and phonation normal. No tracheal deviation.  Cardiovascular:     Rate and Rhythm: Normal rate and regular rhythm.     Pulses: Normal pulses.          Radial pulses are 2+ on the right side and 2+ on the left side.     Heart sounds: Normal heart sounds.  Pulmonary:      Effort: Pulmonary effort is normal. No respiratory distress.     Breath sounds: Normal breath sounds and air entry. No stridor or transmitted upper airway sounds. No wheezing.     Comments: Spoke full sentences without difficulty; no cough observed in exam room Abdominal:     General: Abdomen is flat. Bowel sounds are normal.     Palpations: Abdomen is soft. There is no shifting dullness, fluid wave, hepatomegaly, splenomegaly, mass or pulsatile mass.     Tenderness: There is no abdominal tenderness. There is no right CVA tenderness, left CVA tenderness, guarding or rebound. Negative signs include Murphy's sign.     Hernia: No hernia is present. There is no hernia in the umbilical area or ventral area.     Comments: Dull to percussion x 4 quads; normoactive bowel sounds x 4 quads; standing to sitting to supine and reversed quickly on exam table without assist or pain; negative drop heel test  Musculoskeletal:        General: No swelling or deformity. Normal range of motion.     Right elbow: No swelling, deformity, effusion or lacerations. Normal range of motion.     Left elbow: No swelling, deformity, effusion or lacerations. Normal range of motion.     Right hand: No swelling. Normal range of motion.     Left hand: No swelling. Normal range of motion.     Cervical back: Normal range of motion and neck supple. No swelling, edema, deformity, erythema, signs of trauma, lacerations, rigidity or crepitus. No pain with movement. Normal range of motion.     Thoracic back: No swelling, edema, deformity, signs of trauma or lacerations. Normal range of motion.     Lumbar back: No swelling, edema, deformity, signs of trauma or lacerations. Normal range of motion.     Right ankle: No swelling or deformity.     Left ankle: No swelling or deformity.  Lymphadenopathy:     Head:     Right side of head: No submandibular or preauricular adenopathy.     Left side of head: No submandibular or preauricular  adenopathy.     Cervical: No cervical adenopathy.     Right cervical: No superficial cervical adenopathy.    Left cervical: No superficial cervical adenopathy.  Skin:    General: Skin is warm and dry.     Capillary Refill: Capillary refill takes less than 2 seconds.     Coloration: Skin is not ashen, cyanotic, jaundiced, mottled, pale or sallow.     Findings: No abrasion, abscess, acne, bruising, burn, ecchymosis, erythema, signs of injury, laceration, lesion, petechiae, rash or wound.     Nails: There is no clubbing.  Neurological:     General: No focal deficit present.     Mental Status: He is alert and oriented to person, place, and time. Mental status is at baseline.     GCS: GCS eye subscore is 4. GCS  verbal subscore is 5. GCS motor subscore is 6.     Cranial Nerves: Cranial nerves are intact. No cranial nerve deficit, dysarthria or facial asymmetry.     Sensory: Sensation is intact.     Motor: Motor function is intact. No weakness, tremor, atrophy, abnormal muscle tone or seizure activity.     Coordination: Coordination is intact. Coordination normal.     Gait: Gait is intact. Gait normal.     Comments: In/out of chair and on/off exam table without difficulty; gait sure and steady in clinic; bilateral hand grasp equal 5/5  Psychiatric:        Attention and Perception: Attention and perception normal.        Mood and Affect: Mood and affect normal. Mood is not anxious or depressed.        Speech: Speech normal.        Behavior: Behavior normal. Behavior is cooperative.        Thought Content: Thought content normal.        Cognition and Memory: Cognition and memory normal.        Judgment: Judgment normal.    Results for Grant Wilson, Grant Wilson (MRN [id number]) as of 06/04/2021 21:27  Ref. Range 11/11/2019 08:45 11/11/2019 10:26 06/15/2020 09:56  COMPREHENSIVE METABOLIC PANEL Unknown Rpt (A)    Sodium Latest Ref Range: 134 - 144 mmol/L 137    Potassium Latest Ref Range: 3.5 - 5.2 mmol/L  4.2    Chloride Latest Ref Range: 96 - 106 mmol/L 100    CO2 Latest Ref Range: 20 - 29 mmol/L 22    Glucose Latest Ref Range: 65 - 99 mg/dL 97    BUN Latest Ref Range: 6 - 24 mg/dL 4 (L)    Creatinine Latest Ref Range: 0.76 - 1.27 mg/dL 1.01    Calcium Latest Ref Range: 8.7 - 10.2 mg/dL 9.5    BUN/Creatinine Ratio Latest Ref Range: 9 - 20  4 (L)    Alkaline Phosphatase Latest Ref Range: 39 - 117 IU/L 52    Albumin Latest Ref Range: 3.8 - 4.9 g/dL 4.8    Albumin/Globulin Ratio Latest Ref Range: 1.2 - 2.2  2.2    AST Latest Ref Range: 0 - 40 IU/L 27    ALT Latest Ref Range: 0 - 44 IU/L 18    Total Protein Latest Ref Range: 6.0 - 8.5 g/dL 7.0    Total Bilirubin Latest Ref Range: 0.0 - 1.2 mg/dL 0.5    GFR, Est Non African American Latest Ref Range: >59 mL/min/1.73 84    GFR, Est African American Latest Ref Range: >59 mL/min/1.73 97    Total CHOL/HDL Ratio Latest Ref Range: 0.0 - 5.0 ratio 4.0    Cholesterol, Total Latest Ref Range: 100 - 199 mg/dL 212 (H)    HDL Cholesterol Latest Ref Range: >39 mg/dL 53    Triglycerides Latest Ref Range: 0 - 149 mg/dL 60    VLDL Cholesterol Cal Latest Ref Range: 5 - 40 mg/dL 11    LDL Chol Calc (NIH) Latest Ref Range: 0 - 99 mg/dL 148 (H)    Iron Latest Ref Range: 38 - 169 ug/dL   98  UIBC Latest Ref Range: 111 - 343 ug/dL   274  TIBC Latest Ref Range: 250 - 450 ug/dL   372  Ferritin Latest Ref Range: 30 - 400 ng/mL   61  Iron Saturation Latest Ref Range: 15 - 55 %   26  Vitamin D, 25-Hydroxy Latest  Ref Range: 30.0 - 100.0 ng/mL   22.6 (L)  Vitamin B12 Latest Ref Range: 232 - 1,245 pg/mL   1,242  Globulin, Total Latest Ref Range: 1.5 - 4.5 g/dL 2.2    WBC Latest Ref Range: 3.4 - 10.8 x10E3/uL 3.0 (L)  2.9 (L)  RBC Latest Ref Range: 4.14 - 5.80 x10E6/uL 4.74  5.07  Hemoglobin Latest Ref Range: 13.0 - 17.7 g/dL 15.4  16.0  HCT Latest Ref Range: 37.5 - 51.0 % 44.8  48.1  MCV Latest Ref Range: 79 - 97 fL 95  95  MCH Latest Ref Range: 26.6 - 33.0 pg 32.5   31.6  MCHC Latest Ref Range: 31.5 - 35.7 g/dL 34.4  33.3  RDW Latest Ref Range: 11.6 - 15.4 % 12.6  13.5  Platelets Latest Ref Range: 150 - 450 x10E3/uL 218  199  Neutrophils Latest Ref Range: Not Estab. % 34    Immature Granulocytes Latest Ref Range: Not Estab. % 0    NEUT# Latest Ref Range: 1.4 - 7.0 x10E3/uL 1.0 (L)    Lymphocyte # Latest Ref Range: 0.7 - 3.1 x10E3/uL 1.3    Monocytes Absolute Latest Ref Range: 0.1 - 0.9 x10E3/uL 0.5    Basophils Absolute Latest Ref Range: 0.0 - 0.2 x10E3/uL 0.0    Immature Grans (Abs) Latest Ref Range: 0.0 - 0.1 x10E3/uL 0.0    Lymphs Latest Ref Range: Not Estab. % 45    Monocytes Latest Ref Range: Not Estab. % 17    Basos Latest Ref Range: Not Estab. % 1    Eos Latest Ref Range: Not Estab. % 3    EOS (ABSOLUTE) Latest Ref Range: 0.0 - 0.4 x10E3/uL 0.1    Hematology Comments: Unknown Note:    Hemoglobin A1C Latest Ref Range: 4.8 - 5.6 % 5.7 (H)    Est. 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    Assessment & Plan:  A-Intermittent right sided abdomen pain initial encounter, heartburn  P-Normal exam today.  Colonoscopy in the past year at [place].  Patient to sign medical release with RN Hildred Alamin today to review results.  Patient only aware polyp removed and notified he had hemorrhoids and is to follow up in 2 years for repeat CSP.  RN Hildred Alamin will fax over release of information.  Last labs 2021 consider repeat executive panel with Vitamin D.  Discussed diverticula/diverticulits with patient and given exitcare handouts on abdomen pain/diverticulitis and diverticulosis.  Discussed treatment typically diet  modification and sometimes antibiotics required.  Surgical treatment rare.  Discussed symptoms appendicitis with patient. Follow up re-evaluation if bloating/diarrhea/fever/chills/pain that is worsening/blood stools red or black.   Patient verbalized understanding information/instructions, agreed with plan of care and had no further questions at this time.

## 2021-06-06 ENCOUNTER — Encounter: Payer: Self-pay | Admitting: *Deleted

## 2021-06-06 NOTE — Progress Notes (Signed)
Fax cover letter printed for medical records ROI faxed to [facility] for colonoscopy report.

## 2021-06-11 ENCOUNTER — Encounter: Payer: Self-pay | Admitting: Registered Nurse

## 2021-06-11 ENCOUNTER — Other Ambulatory Visit: Payer: Self-pay

## 2021-06-11 ENCOUNTER — Ambulatory Visit: Payer: Self-pay | Admitting: Registered Nurse

## 2021-06-11 DIAGNOSIS — R1011 Right upper quadrant pain: Secondary | ICD-10-CM

## 2021-06-11 NOTE — Patient Instructions (Addendum)
Decrease nuts and seeds in diet x 1 week   Hepatitis B Hepatitis B is a liver infection that is caused by the hepatitis B virus (HBV). It can spread from person to person (is contagious). HBV causes inflammation in the liver. There are two kinds of hepatitis B: Acute hepatitis B. This lasts for 6 months or less. Long-term (chronic) hepatitis B. This lasts for more than 6 months. Chronic hepatitis B can lead to: Loss of liver function (liver failure). Scarring of the liver (cirrhosis). Liver cancer. Most adults with acute hepatitis B do not develop chronic hepatitis B. Babies and young children who get hepatitis B are more likely to develop chronic hepatitis B than adults. The HBV vaccine can prevent this infection. What are the causes? This condition is caused by HBV. The virus may spread through: Contact with the body fluids of an infected person. This includes blood, breast milk, tears, saliva, semen, and vaginal fluids. Childbirth. A woman who has hepatitis B can pass it to her baby during birth. What increases the risk? The following factors may make you more likely to develop this condition: Having contact with needles or syringes that have HBV on them (are contaminated). This may happen by injecting drugs, getting a tattoo, or getting body piercing. Contact may also happen while receiving a treatment that inserts thin needles through your skin. This is called acupuncture. Having sex with someone who is infected and not using a condom. The virus can be passed through vaginal, anal, or oral sex. Living with or having close contact with a person who has hepatitis B. Working in a job that involves contact with blood or body fluids, such as in health care. Traveling to a country where hepatitis B is common. Receiving treatment to filter your blood (kidney dialysis). Having received donated blood (blood transfusion), or having had an organ transplant. What are the signs or symptoms? Symptoms  of this condition may include: Loss of appetite. Fever or tiredness (fatigue). Nausea or vomiting. Stomach pain. Dark yellow urine. Yellowing of your skin or the white parts of your eyes (jaundice). Light-colored or tan stool. Joint pain. Often, hepatitis B causes no symptoms. How is this diagnosed? This condition is diagnosed based on: A physical exam. Your medical history. Blood tests. How is this treated? Treatment for chronic hepatitis B may include antiviral medicine. This medicine may help: Lower your risk of liver failure, cirrhosis, or liver cancer. Lower your ability to pass HBV to others. You will need to avoid taking certain medicines that can cause more damage to your liver. Follow these instructions at home: Medicines Take over-the-counter and prescription medicines only as told by your health care provider. If you were prescribed an antiviral medicine, take it as told by your health care provider. Do not stop using the antiviral even if you start to feel better. Do not take: Over-the-counter medicines that contain acetaminophen. New medicines, including over-the-counter medicines or supplements, unless approved by your health care provider. Activity Rest as needed. Do not have sex unless your health care provider approves. Return to your normal activities as told by your health care provider. Ask your health care provider what activities are safe for you. Ask your health care provider when you may return to school or work. Avoid swimming and using hot tubs until your health care provider approves. Eating and drinking   Eat a healthy diet with plenty of fruits and vegetables, whole grains, and lean meats or non-meat proteins, such as beans or  tofu. Drink enough fluids to keep your urine pale yellow. Do not drink alcohol. General instructions Do not share toothbrushes, nail clippers, or razors. Wash your hands often with soap and water for at least 20 seconds. If  soap and water are not available, use alcohol-based hand sanitizer. Tell your health care provider about all the people with whom you live or have close contact. Your health care provider may recommend that they receive the HBV vaccine. Cover any cuts or open sores on your skin to prevent spreading HBV. Follow other instructions from your health care provider about how to avoid spreading HBV. Keep all follow-up visits. This is important. How is this prevented? Get the HBV vaccine. This helps prevent the hepatitis B infection. If you have been exposed to HBV recently, your health care provider may recommend that you get one of the following to help prevent infection: Human immunoglobulin. This is a blood protein that is given by injection or by IV to fight germs in the body. The HBV vaccine. Even if you got this vaccine as a child, a booster shot may help. Wash your hands often with soap and water for at least 20 seconds. Use a condom every time you have vaginal, oral, or anal sex. Latex condoms offer the best protection. Do not share needles or syringes. Do not handle blood or other body fluids without gloves or other protection. Avoid getting tattoos or piercings in shops that are not clean. Where to find more information Centers for Disease Control and Prevention: [employer]usi[employer].hu World Health Organization: RoleLink.com.br Contact a health care provider if you: Develop a rash. Develop jaundice, or your chronic jaundice is more severe. Have a fever or chills. Get help right away if you: Are unable to eat or drink. Feel confused. Have trouble breathing. Have swelling of your skin, throat, mouth, or face. Have a seizure. Become very sleepy or have trouble waking up. Have a swollen abdomen. These symptoms may represent a serious problem that is an emergency. Do not wait to see if the symptoms will go away. Get medical help right away. Call your local emergency services (911 in the [place]  Do not drive yourself to the hospital. Summary Hepatitis B is a liver infection that is caused by the hepatitis B virus (HBV). The two types of hepatitis B include acute and long-term (chronic) hepatitis B. HBV may spread from person to person (is contagious). Do not take any new medicines, including over-the-counter medicines or supplements, unless approved by your health care provider. To help prevent hepatitis B, wash your hands often with soap and water for at least 20 seconds. If soap and water are not available, use alcohol-based hand sanitizer. This information is not intended to replace advice given to you by your health care provider. Make sure you discuss any questions you have with your health care provider. Document Revised: 06/21/2020 Document Reviewed: 06/21/2020 Elsevier Patient Education  2022 [employer].

## 2021-06-11 NOTE — Progress Notes (Signed)
Subjective:    Patient ID: Grant Wilson, male    DOB: [birth year], 55 y.o.   MRN: [id number]  55y/o african american male established patient last seen 06/04/21 for abdomen pain right intermittent. Pain has not changed since it started not worsening or improvement.  Stools formed regular daily denied blood red or black or diarrhea/constipation/vomiting.  History of reflux up esophagus but does not get heartburn/pain with reflux only e.g. feels esophagus fill up.   Notified patient received colonoscopy results/pathology from [facility]/reviewed and patient wanted to discussed results and symptoms again today.  Still having intermittent right sided abdomen pain if lying to sleep on right side and feels that right side has increased temperature with patient also.  Denied fever/chills/n/v/d.  Patient changed diet 2 years ago cut out meat vegetarian now lost a few pounds and bulk/caliber of stool decreased slightly but has remained constant since that time.  Had colonoscopy Jan 2022 [facility] this year and polyps removed and patient told he also had hemorrhoids.  See onsite paper chart [facility] for pathology/CSP results summarized in this note.    Review of Systems  Constitutional:  Negative for activity change, appetite change, chills, diaphoresis, fatigue and fever.  HENT:  Negative for trouble swallowing and voice change.   Eyes:  Negative for photophobia and visual disturbance.  Respiratory:  Negative for cough, shortness of breath, wheezing and stridor.   Cardiovascular:  Negative for chest pain.  Gastrointestinal:  Positive for abdominal pain. Negative for abdominal distention, anal bleeding, blood in stool, constipation, diarrhea, nausea, rectal pain and vomiting.  Endocrine: Negative for cold intolerance and heat intolerance.  Genitourinary:  Negative for difficulty urinating.  Musculoskeletal:  Negative for back pain, gait problem and joint swelling.  Skin:  Negative for pallor and  rash.  Allergic/Immunologic: Negative for food allergies.  Neurological:  Negative for dizziness, tremors, seizures, syncope, facial asymmetry, speech difficulty, weakness, light-headedness, numbness and headaches.  Hematological:  Negative for adenopathy. Does not bruise/bleed easily.  Psychiatric/Behavioral:  Positive for sleep disturbance. Negative for agitation and confusion.       Objective:   Physical Exam Vitals and nursing note reviewed.  Constitutional:      General: He is awake. He is not in acute distress.    Appearance: Normal appearance. He is well-developed, well-groomed and normal weight. He is not ill-appearing, toxic-appearing or diaphoretic.  HENT:     Head: Normocephalic and atraumatic.     Jaw: There is normal jaw occlusion.     Salivary Glands: Right salivary gland is not diffusely enlarged. Left salivary gland is not diffusely enlarged.     Right Ear: Hearing and external ear normal.     Left Ear: Hearing and external ear normal.     Nose: Nose normal. No congestion or rhinorrhea.     Mouth/Throat:     Lips: Pink. No lesions.     Mouth: Mucous membranes are moist.     Pharynx: Oropharynx is clear.  Eyes:     General: Lids are normal. Vision grossly intact. Gaze aligned appropriately. No allergic shiner or scleral icterus.       Right eye: No discharge.        Left eye: No discharge.     Extraocular Movements: Extraocular movements intact.     Conjunctiva/sclera: Conjunctivae normal.     Pupils: Pupils are equal, round, and reactive to light.  Neck:     Trachea: Trachea normal.  Cardiovascular:     Rate  and Rhythm: Normal rate and regular rhythm.     Pulses:          Radial pulses are 2+ on the right side and 2+ on the left side.  Pulmonary:     Effort: Pulmonary effort is normal. No respiratory distress.     Breath sounds: Normal breath sounds and air entry. No stridor or transmitted upper airway sounds. No wheezing.     Comments: Spoke full sentences  without difficulty; no cough observed in exam room Abdominal:     General: Abdomen is flat. Bowel sounds are normal. There is no distension.     Palpations: There is no shifting dullness, fluid wave, hepatomegaly, splenomegaly, mass or pulsatile mass.     Tenderness: There is no abdominal tenderness. There is no right CVA tenderness, left CVA tenderness, guarding or rebound. Negative signs include Murphy's sign.     Hernia: No hernia is present.       Comments: Dull to percussion x 4 quads; normoactive bowel sounds x4 quads; standing to sitting to supine and reversed quickly without assistance on exam table; no hernia or diastasis observed/palpated ventral/umbilical with and without abdominal crunch  Musculoskeletal:        General: Normal range of motion.     Cervical back: Normal range of motion and neck supple. No swelling, edema, deformity, erythema, signs of trauma, lacerations, rigidity, spasms, torticollis, tenderness or crepitus. No pain with movement. Normal range of motion.     Thoracic back: No swelling, edema, deformity, signs of trauma, lacerations, spasms or tenderness. Normal range of motion.     Lumbar back: No swelling, edema, deformity, signs of trauma, lacerations, spasms or tenderness. Normal range of motion.  Lymphadenopathy:     Head:     Right side of head: No submandibular or preauricular adenopathy.     Left side of head: No submandibular or preauricular adenopathy.     Cervical:     Right cervical: No superficial cervical adenopathy.    Left cervical: No superficial cervical adenopathy.  Skin:    General: Skin is warm and dry.     Capillary Refill: Capillary refill takes less than 2 seconds.     Coloration: Skin is not ashen, cyanotic, jaundiced, mottled, pale or sallow.     Findings: No abrasion, abscess, acne, bruising, burn, ecchymosis, erythema, signs of injury, laceration, lesion, petechiae, rash or wound.     Nails: There is no clubbing.  Neurological:      General: No focal deficit present.     Mental Status: He is alert and oriented to person, place, and time. Mental status is at baseline.     GCS: GCS eye subscore is 4. GCS verbal subscore is 5. GCS motor subscore is 6.     Cranial Nerves: Cranial nerves 2-12 are intact. No cranial nerve deficit, dysarthria or facial asymmetry.     Sensory: Sensation is intact. No sensory deficit.     Motor: Motor function is intact. No weakness, tremor, atrophy, abnormal muscle tone or seizure activity.     Coordination: Coordination is intact. Coordination normal.     Gait: Gait is intact. Gait normal.     Comments: In/out of chair without difficulty; gait sure and steady in clinic; bilateral hand grasp equal 5/5  Psychiatric:        Attention and Perception: Attention and perception normal.        Mood and Affect: Mood and affect normal.        Speech:  Speech normal.        Behavior: Behavior normal. Behavior is cooperative.        Thought Content: Thought content normal.        Cognition and Memory: Cognition and memory normal.        Judgment: Judgment normal.     Results for Grant Wilson, Grant Wilson (MRN [id number]) as of 06/11/2021 19:30  Ref. Range 11/11/2019 08:45 11/11/2019 10:26 06/15/2020 09:56  COMPREHENSIVE METABOLIC PANEL Unknown Rpt (A)    Sodium Latest Ref Range: 134 - 144 mmol/L 137    Potassium Latest Ref Range: 3.5 - 5.2 mmol/L 4.2    Chloride Latest Ref Range: 96 - 106 mmol/L 100    CO2 Latest Ref Range: 20 - 29 mmol/L 22    Glucose Latest Ref Range: 65 - 99 mg/dL 97    BUN Latest Ref Range: 6 - 24 mg/dL 4 (L)    Creatinine Latest Ref Range: 0.76 - 1.27 mg/dL 1.01    Calcium Latest Ref Range: 8.7 - 10.2 mg/dL 9.5    BUN/Creatinine Ratio Latest Ref Range: 9 - 20  4 (L)    Alkaline Phosphatase Latest Ref Range: 39 - 117 IU/L 52    Albumin Latest Ref Range: 3.8 - 4.9 g/dL 4.8    Albumin/Globulin Ratio Latest Ref Range: 1.2 - 2.2  2.2    AST Latest Ref Range: 0 - 40 IU/L 27    ALT Latest Ref  Range: 0 - 44 IU/L 18    Total Protein Latest Ref Range: 6.0 - 8.5 g/dL 7.0    Total Bilirubin Latest Ref Range: 0.0 - 1.2 mg/dL 0.5    GFR, Est Non African American Latest Ref Range: >59 mL/min/1.73 84    GFR, Est African American Latest Ref Range: >59 mL/min/1.73 97    Total CHOL/HDL Ratio Latest Ref Range: 0.0 - 5.0 ratio 4.0    Cholesterol, Total Latest Ref Range: 100 - 199 mg/dL 212 (H)    HDL Cholesterol Latest Ref Range: >39 mg/dL 53    Triglycerides Latest Ref Range: 0 - 149 mg/dL 60    VLDL Cholesterol Cal Latest Ref Range: 5 - 40 mg/dL 11    LDL Chol Calc (NIH) Latest Ref Range: 0 - 99 mg/dL 148 (H)    Iron Latest Ref Range: 38 - 169 ug/dL   98  UIBC Latest Ref Range: 111 - 343 ug/dL   274  TIBC Latest Ref Range: 250 - 450 ug/dL   372  Ferritin Latest Ref Range: 30 - 400 ng/mL   61  Iron Saturation Latest Ref Range: 15 - 55 %   26  Vitamin D, 25-Hydroxy Latest Ref Range: 30.0 - 100.0 ng/mL   22.6 (L)  Vitamin B12 Latest Ref Range: 232 - 1,245 pg/mL   1,242  Globulin, Total Latest Ref Range: 1.5 - 4.5 g/dL 2.2    WBC Latest Ref Range: 3.4 - 10.8 x10E3/uL 3.0 (L)  2.9 (L)  RBC Latest Ref Range: 4.14 - 5.80 x10E6/uL 4.74  5.07  Hemoglobin Latest Ref Range: 13.0 - 17.7 g/dL 15.4  16.0  HCT Latest Ref Range: 37.5 - 51.0 % 44.8  48.1  MCV Latest Ref Range: 79 - 97 fL 95  95  MCH Latest Ref Range: 26.6 - 33.0 pg 32.5  31.6  MCHC Latest Ref Range: 31.5 - 35.7 g/dL 34.4  33.3  RDW Latest Ref Range: 11.6 - 15.4 % 12.6  13.5  Platelets Latest Ref Range: 150 - 450  x10E3/uL 218  199  Neutrophils Latest Ref Range: Not Estab. % 34    Immature Granulocytes Latest Ref Range: Not Estab. % 0    NEUT# Latest Ref Range: 1.4 - 7.0 x10E3/uL 1.0 (L)    Lymphocyte # Latest Ref Range: 0.7 - 3.1 x10E3/uL 1.3    Monocytes Absolute Latest Ref Range: 0.1 - 0.9 x10E3/uL 0.5    Basophils Absolute Latest Ref Range: 0.0 - 0.2 x10E3/uL 0.0    Immature Grans (Abs) Latest Ref Range: 0.0 - 0.1 x10E3/uL 0.0     Lymphs Latest Ref Range: Not Estab. % 45    Monocytes Latest Ref Range: Not Estab. % 17    Basos Latest Ref Range: Not Estab. % 1    Eos Latest Ref Range: Not Estab. % 3    EOS (ABSOLUTE) Latest Ref Range: 0.0 - 0.4 x10E3/uL 0.1    Hematology Comments: Unknown Note:    Hemoglobin A1C Latest Ref Range: 4.8 - 5.6 % 5.7 (H)    Est. average glucose Bld gHb Est-mCnc Latest Units: mg/dL 117    TSH Latest Ref Range: 0.450 - 4.500 uIU/mL 0.940    Anti Nuclear Antibody (ANA) Latest Ref Range: Negative    Positive (A)  Prostate Specific Ag, Serum Latest Ref Range: 0.0 - 4.0 ng/mL  1.2        Assessment & Plan:   A-abdomen pain right subsequent visit  P-Cannot reproduce pain in clinic with palpation or movements.  Patient will only notice when trying to sleep on his right side at home.  Notes abdomen will become a little warmer and painful when lying on his right side for extended period.  Will decrease seeds and nuts in diet x 1 week.  Has not had labs in previous year will do exec panel with vitamin D level and Hgba1c.  Will have RN Hildred Alamin schedule with patient when she returns to clinic otherwise may hold on labs until 2023 [facility] is symptoms improve with removal of seeds and nuts in diet x 1 week.  Noted rheumatology found + ANA 2021 and patient with history of hepatitis B.  Patient reported changed diet 2 years ago and has not had any further changes in stool bulk since stopping meat 2 years ago.  Regular stools.  Reviewed GI notes from [facility] received via fax today.  CSP Jan 2022 5 polyps largest removed 62mm and next CSP due in 2 years 2024 January.  Noted to also have hemorrhoids but no diverticula.  Consider imaging and consult with GI if no improvement in symptoms with dietary change with polyp and Hep B history.  Patient verbalized understanding information/instructions, agreed with plan of care and had no further questions at this time.

## 2021-06-28 ENCOUNTER — Ambulatory Visit: Payer: Self-pay | Admitting: *Deleted

## 2021-06-28 ENCOUNTER — Other Ambulatory Visit: Payer: Self-pay

## 2021-06-28 VITALS — BP 160/81 | HR 89

## 2021-06-28 DIAGNOSIS — Z0000 Encounter for general adult medical examination without abnormal findings: Secondary | ICD-10-CM

## 2021-06-28 DIAGNOSIS — R7402 Elevation of levels of lactic acid dehydrogenase (LDH): Secondary | ICD-10-CM

## 2021-06-28 DIAGNOSIS — E559 Vitamin D deficiency, unspecified: Secondary | ICD-10-CM

## 2021-06-28 DIAGNOSIS — E7800 Pure hypercholesterolemia, unspecified: Secondary | ICD-10-CM

## 2021-06-28 DIAGNOSIS — R7303 Prediabetes: Secondary | ICD-10-CM

## 2021-06-28 DIAGNOSIS — D72819 Decreased white blood cell count, unspecified: Secondary | ICD-10-CM

## 2021-06-28 NOTE — Progress Notes (Signed)
Fasting labs per NP Otila Kluver

## 2021-06-29 ENCOUNTER — Encounter: Payer: Self-pay | Admitting: Registered Nurse

## 2021-06-29 LAB — CMP12+LP+TP+TSH+6AC+CBC/D/PLT
ALT: 17 IU/L (ref 0–44)
AST: 25 IU/L (ref 0–40)
Albumin/Globulin Ratio: 1.7 (ref 1.2–2.2)
Albumin: 4.5 g/dL (ref 3.8–4.9)
Alkaline Phosphatase: 59 IU/L (ref 44–121)
BUN/Creatinine Ratio: 8 — ABNORMAL LOW (ref 9–20)
BUN: 8 mg/dL (ref 6–24)
Basophils Absolute: 0 10*3/uL (ref 0.0–0.2)
Basos: 1 %
Bilirubin Total: 0.4 mg/dL (ref 0.0–1.2)
Calcium: 9.3 mg/dL (ref 8.7–10.2)
Chloride: 98 mmol/L (ref 96–106)
Chol/HDL Ratio: 4 ratio (ref 0.0–5.0)
Cholesterol, Total: 226 mg/dL — ABNORMAL HIGH (ref 100–199)
Creatinine, Ser: 1.02 mg/dL (ref 0.76–1.27)
EOS (ABSOLUTE): 0 10*3/uL (ref 0.0–0.4)
Eos: 1 %
Estimated CHD Risk: 0.7 times avg. (ref 0.0–1.0)
Free Thyroxine Index: 1.5 (ref 1.2–4.9)
GGT: 20 IU/L (ref 0–65)
Globulin, Total: 2.6 g/dL (ref 1.5–4.5)
Glucose: 79 mg/dL (ref 70–99)
HDL: 57 mg/dL (ref >39)
Hematocrit: 44.2 % (ref 37.5–51.0)
Hemoglobin: 15 g/dL (ref 13.0–17.7)
Immature Grans (Abs): 0 10*3/uL (ref 0.0–0.1)
Immature Granulocytes: 0 %
Iron: 100 ug/dL (ref 38–169)
LDH: 183 IU/L (ref 121–224)
LDL Chol Calc (NIH): 157 mg/dL — ABNORMAL HIGH (ref 0–99)
Lymphocytes Absolute: 1.3 10*3/uL (ref 0.7–3.1)
Lymphs: 46 %
MCH: 30.9 pg (ref 26.6–33.0)
MCHC: 33.9 g/dL (ref 31.5–35.7)
MCV: 91 fL (ref 79–97)
Monocytes Absolute: 0.4 10*3/uL (ref 0.1–0.9)
Monocytes: 14 %
Neutrophils Absolute: 1.1 10*3/uL — ABNORMAL LOW (ref 1.4–7.0)
Neutrophils: 38 %
Phosphorus: 3.5 mg/dL (ref 2.8–4.1)
Platelets: 208 10*3/uL (ref 150–450)
Potassium: 4.6 mmol/L (ref 3.5–5.2)
RBC: 4.85 x10E6/uL (ref 4.14–5.80)
RDW: 13 % (ref 11.6–15.4)
Sodium: 137 mmol/L (ref 134–144)
T3 Uptake Ratio: 24 % (ref 24–39)
T4, Total: 6.4 ug/dL (ref 4.5–12.0)
TSH: 1.34 u[IU]/mL (ref 0.450–4.500)
Total Protein: 7.1 g/dL (ref 6.0–8.5)
Triglycerides: 68 mg/dL (ref 0–149)
Uric Acid: 5.6 mg/dL (ref 3.8–8.4)
VLDL Cholesterol Cal: 12 mg/dL (ref 5–40)
WBC: 2.8 10*3/uL — ABNORMAL LOW (ref 3.4–10.8)
eGFR: 87 mL/min/{1.73_m2} (ref >59)

## 2021-06-29 LAB — VITAMIN D 25 HYDROXY (VIT D DEFICIENCY, FRACTURES): Vit D, 25-Hydroxy: 26.8 ng/mL — ABNORMAL LOW (ref 30.0–100.0)

## 2021-06-29 LAB — HGB A1C W/O EAG: Hgb A1c MFr Bld: 5.8 % — ABNORMAL HIGH (ref 4.8–5.6)

## 2021-06-29 NOTE — Addendum Note (Signed)
Addended by: Gerarda Fraction A on: 06/29/2021 03:02 PM   Modules accepted: Orders

## 2021-07-01 NOTE — Progress Notes (Signed)
Reviewed results with pt in clinic as per NP notes above. Handouts in [place], also gave printed copy of cholesterol handout with hard copy labs. Advised 2000units Vitamin D3 po daily with fattiest meal along with 4min sunshine on skin daily. Pt wants to know what brand BP home cuff clinic recommends.Advised Omron and continue monitoring BP. Pt does not have pcp appt scheduled. Wants to know at what point NP recommends he see pcp regarding elevated BPs.

## 2021-07-01 NOTE — Progress Notes (Signed)
Agreed with plan of care regarding vitamin D supplementation and blood pressure monitoring.  My chart message with [facility] handouts on dash diet and preventing hypertension sent to patient.  Blood pressure log x 1 month, increase potassium rich foods, dash diet and if blood pressure not consistently below 130/80 then I recommend starting blood pressure medication/seeing PCM.

## 2021-07-04 ENCOUNTER — Ambulatory Visit: Payer: Self-pay | Admitting: *Deleted

## 2021-07-04 ENCOUNTER — Other Ambulatory Visit: Payer: Self-pay

## 2021-07-04 VITALS — BP 165/88 | HR 75

## 2021-07-04 DIAGNOSIS — Z0130 Encounter for examination of blood pressure without abnormal findings: Secondary | ICD-10-CM

## 2021-07-04 NOTE — Progress Notes (Signed)
Routine BP check per pt request.

## 2021-07-05 ENCOUNTER — Ambulatory Visit
Admission: EM | Admit: 2021-07-05 | Discharge: 2021-07-05 | Disposition: A | Discharge status: Home / Self Care | Payer: No Typology Code available for payment source | Attending: Physician Assistant | Admitting: Physician Assistant

## 2021-07-05 ENCOUNTER — Encounter: Payer: Self-pay | Admitting: Emergency Medicine

## 2021-07-05 DIAGNOSIS — I10 Essential (primary) hypertension: Secondary | ICD-10-CM

## 2021-07-05 MED ORDER — HYDROCHLOROTHIAZIDE 12.5 MG PO CAPS: 12.5000 mg | ORAL_CAPSULE | Freq: Every day | ORAL | 0 refills | Status: DC

## 2021-07-05 NOTE — ED Triage Notes (Addendum)
Intermittent bilateral headache over the last week. Patient states when this happens, he believes it's due to his high blood pressure. In triage today, BP 177/76. States he does not take daily medication for his blood pressure. Denies any numbness or tingling, slurred speech, confusion, weakness.  Says he decided to come in because last night the headache moved to behind his left eye, and that his blood pressure was reading high. He wanted to see if there was anything we could give him to bring it down today.

## 2021-07-05 NOTE — ED Provider Notes (Signed)
[facility]    CSN: [id number] Arrival date & time: 07/05/21  1541      History   Chief Complaint Chief Complaint  Patient presents with   Headache   Hypertension    HPI Grant Wilson is a 55 y.o. male.   The history is provided by the patient. No language interpreter was used.  Headache Hypertension This is a new problem. The current episode started more than 1 week ago. The problem occurs constantly. Associated symptoms include headaches. Nothing aggravates the symptoms. Nothing relieves the symptoms. He has tried nothing for the symptoms. The treatment provided no relief.   Past Medical History:  Diagnosis Date   Allergy    GERD (gastroesophageal reflux disease)    Hepatitis B    Hyperlipidemia    Prediabetes     Patient Active Problem List   Diagnosis Date Noted   Vitamin D deficiency 08/24/2020   Pain in right hand 08/24/2020   Positive ANA (antinuclear antibody) 08/24/2020   Right wrist pain 11/15/2018   Elevated LDH 11/15/2018   Prediabetes 10/24/2017   Gastroesophageal reflux disease without esophagitis 11/08/2016   DDD (degenerative disc disease), lumbar 01/11/2016   Chronic low back pain 07/22/2012   High cholesterol 05/12/2012   Seasonal allergies 05/12/2012   Hepatitis B 09/22/2011    Past Surgical History:  Procedure Laterality Date   NO PAST SURGERIES         Home Medications    Prior to Admission medications   Medication Sig Start Date End Date Taking? Authorizing Provider  hydrochlorothiazide (MICROZIDE) 12.5 MG capsule Take 1 capsule (12.5 mg total) by mouth daily. 07/05/21 07/05/22 Yes Fransico Meadow, PA-C  Multiple Vitamin (MULTI VITAMIN DAILY PO) Take by mouth.    [provider]  tenofovir (VIREAD) 300 MG tablet Take 300 mg by mouth daily. Reported on 01/11/2016    [provider]    Family History History reviewed. No pertinent family history.  Social History Social History   Tobacco Use    Smoking status: Never   Smokeless tobacco: Never  Vaping Use   Vaping Use: Never used  Substance Use Topics   Alcohol use: No    Alcohol/week: 0.0 standard drinks   Drug use: No     Allergies   Patient has no known allergies.   Review of Systems Review of Systems  Neurological:  Positive for headaches.  All other systems reviewed and are negative.   Physical Exam Triage Vital Signs ED Triage Vitals  Enc Vitals Group     BP 07/05/21 1602 (!) 177/76     Pulse Rate 07/05/21 1602 75     Resp 07/05/21 1602 16     Temp 07/05/21 1602 98.2 F (36.8 C)     Temp Source 07/05/21 1602 Oral     SpO2 07/05/21 1602 98 %     Weight --      Height --      Head Circumference --      Peak Flow --      Pain Score 07/05/21 1605 4     Pain Loc --      Pain Edu? --      Excl. in [place]? --    No data found.  Updated Vital Signs BP (!) 177/76 (BP Location: Left Arm)   Pulse 75   Temp 98.2 F (36.8 C) (Oral)   Resp 16   SpO2 98%   Visual Acuity Right Eye Distance:  Left Eye Distance:   Bilateral Distance:    Right Eye Near:   Left Eye Near:    Bilateral Near:     Physical Exam Vitals reviewed.  Constitutional:      Appearance: He is well-developed.  Cardiovascular:     Rate and Rhythm: Normal rate and regular rhythm.  Pulmonary:     Effort: Pulmonary effort is normal.  Musculoskeletal:        General: Normal range of motion.  Neurological:     Mental Status: He is alert.  Psychiatric:        Mood and Affect: Mood normal.     UC Treatments / Results  Labs (all labs ordered are listed, but only abnormal results are displayed) Labs Reviewed - No data to display  EKG   Radiology No results found.  Procedures Procedures (including critical care time)  Medications Ordered in UC Medications - No data to display  Initial Impression / Assessment and Plan / UC Course  I have reviewed the triage vital signs and the nursing notes.  Pertinent labs & imaging  results that were available during my care of the patient were reviewed by me and considered in my medical decision making (see chart for details).     MDM: Pt given rx for hctz.  Pt advised to schedule primary care  Final Clinical Impressions(s) / UC Diagnoses   Final diagnoses:  Essential hypertension     Discharge Instructions      Schedule evaluation with primary care    ED Prescriptions     Medication Sig Dispense Auth. Provider   hydrochlorothiazide (MICROZIDE) 12.5 MG capsule Take 1 capsule (12.5 mg total) by mouth daily. 30 capsule Fransico Meadow, [place]      PDMP not reviewed this encounter.   Fransico Meadow, [place] 07/05/21 2004

## 2021-07-05 NOTE — Discharge Instructions (Addendum)
Schedule evaluation with primary care

## 2021-07-09 ENCOUNTER — Other Ambulatory Visit: Payer: Self-pay

## 2021-07-09 ENCOUNTER — Ambulatory Visit: Payer: Self-pay | Admitting: *Deleted

## 2021-07-09 ENCOUNTER — Encounter: Payer: Self-pay | Admitting: Registered Nurse

## 2021-07-09 ENCOUNTER — Telehealth: Payer: Self-pay | Admitting: Registered Nurse

## 2021-07-09 VITALS — BP 168/86 | HR 76

## 2021-07-09 DIAGNOSIS — I10 Essential (primary) hypertension: Secondary | ICD-10-CM

## 2021-07-09 NOTE — Telephone Encounter (Signed)
RN Hildred Alamin notified me  patient had seen urgent care 07/05/21 and started on hydrochlorothiazide 12.5mg  po daily. Patient told to follow up with PCM.  RN Hildred Alamin discussed network offices accepting new patients.   Patient given information on how to establish with network PCM office by RN Hildred Alamin.  Patient to follow up with RN Theodis Sato on Friday for BP recheck.  Patient given my work from home number [phone] due to holiday clinic closure 07/11/21.  His blood pressure cuff not reading reliantly at home per patient and he has not brought into clinic for comparison.  Patient stated headache right occiput today and had BP check with RN.  Took his hydrochlorothiazide today.  BP 168/86 pulse 76 no leg edema on exam, skin warm dry and pink respirations even and unlabored A&Ox3 spoke full sentences without difficulty PERRL  Discussed hydrochlorothiazide can lower blood potassium levels and will need labs to check kidneys and electrolytes   Will draw nonfasting BMET with RN Hildred Alamin in 1 week.  Patient denied dizziness/lightheadedness today, weakness or fatigue.  If BP continues 160s/80s or above goal 120s/70s and having headache will increase to 25mg  po daily  Discussed with patient some individuals do need potassium supplement with hydrochlorothiazide and it does make patient urinate more often therefore recommended to take in the morning not prior to bedtime.  Patient verbalized understanding information/instructions, agreed with plan of care and had no further questions at this time.  Will need to discuss symptoms of hypokalemia with patient at next follow up/  I recommend increasing intake of potassium rich foods e.g. banana, avocado, spinach, potatoes, sweet potatoes, orange, cantaloupe, tomatoes, yogurt, lentils, dried apricots, salmon, beans, rasisins, watermelon, broccoli, navy beans, swiss chard, beets, brussel sprouts, peas, acord squash, prunes, pumpkin, guava, kiwi fruit, pomegranate, and cherries.  Soup  broths, Gatorade/powerade/pedialyte also have electrolytes in them. Follow up with a provider for re-evaluation if muscle cramps, palpitations, weakness, nausea, vomiting, diarrhea, fatigue, or syncope.

## 2021-07-09 NOTE — Progress Notes (Signed)
Was having HA last week and went to [facility] 11/18 and Rx'd HCTZ 12.5mg  daily. Will return in one week for recheck.  NP Otila Kluver spoke with pt as well and advised if bad HA returns over holiday to call her remote work number and can increase HCTZ to 25mg .

## 2021-07-16 ENCOUNTER — Other Ambulatory Visit: Payer: Self-pay

## 2021-07-16 ENCOUNTER — Ambulatory Visit: Payer: Self-pay | Admitting: Registered Nurse

## 2021-07-16 VITALS — BP 156/87 | HR 74 | Temp 98.0°F

## 2021-07-16 DIAGNOSIS — I10 Essential (primary) hypertension: Secondary | ICD-10-CM

## 2021-07-16 MED ORDER — HYDROCHLOROTHIAZIDE 12.5 MG PO CAPS: 25.0000 mg | ORAL_CAPSULE | Freq: Every day | ORAL | 0 refills | Status: DC

## 2021-07-16 MED ORDER — AMLODIPINE BESYLATE 5 MG PO TABS: 5.0000 mg | ORAL_TABLET | Freq: Every day | ORAL | 0 refills | Status: DC

## 2021-07-16 NOTE — Patient Instructions (Addendum)
Increase hydrochlorothiazide to 25mg  daily (2 capsules) If still having headache start amlodipine 5mg  daily dispensed to patient from [facility] today Avoid caffeine this afternoon and limit to 1 serving daily until blood pressure at goal 100-120/60-70  DASH Eating Plan DASH stands for Dietary Approaches to Stop Hypertension. The DASH eating plan is a healthy eating plan that has been shown to: Reduce high blood pressure (hypertension). Reduce your risk for type 2 diabetes, heart disease, and stroke. Help with weight loss. What are tips for following this plan? Reading food labels Check food labels for the amount of salt (sodium) per serving. Choose foods with less than 5 percent of the Daily Value of sodium. Generally, foods with less than 300 milligrams (mg) of sodium per serving fit into this eating plan. To find whole grains, look for the word "whole" as the first word in the ingredient list. Shopping Buy products labeled as "low-sodium" or "no salt added." Buy fresh foods. Avoid canned foods and pre-made or frozen meals. Cooking Avoid adding salt when cooking. Use salt-free seasonings or herbs instead of table salt or sea salt. Check with your health care provider or pharmacist before using salt substitutes. Do not fry foods. Cook foods using healthy methods such as baking, boiling, grilling, roasting, and broiling instead. Cook with heart-healthy oils, such as olive, canola, avocado, soybean, or sunflower oil. Meal planning  Eat a balanced diet that includes: 4 or more servings of fruits and 4 or more servings of vegetables each day. Try to fill one-half of your plate with fruits and vegetables. 6-8 servings of whole grains each day. Less than 6 oz (170 g) of lean meat, poultry, or fish each day. A 3-oz (85-g) serving of meat is about the same size as a deck of cards. One egg equals 1 oz (28 g). 2-3 servings of low-fat dairy each day. One serving is 1 cup (237 mL). 1 serving of nuts,  seeds, or beans 5 times each week. 2-3 servings of heart-healthy fats. Healthy fats called omega-3 fatty acids are found in foods such as walnuts, flaxseeds, fortified milks, and eggs. These fats are also found in cold-water fish, such as sardines, salmon, and mackerel. Limit how much you eat of: Canned or prepackaged foods. Food that is high in trans fat, such as some fried foods. Food that is high in saturated fat, such as fatty meat. Desserts and other sweets, sugary drinks, and other foods with added sugar. Full-fat dairy products. Do not salt foods before eating. Do not eat more than 4 egg yolks a week. Try to eat at least 2 vegetarian meals a week. Eat more home-cooked food and less restaurant, buffet, and fast food. Lifestyle When eating at a restaurant, ask that your food be prepared with less salt or no salt, if possible. If you drink alcohol: Limit how much you use to: 0-1 drink a day for women who are not pregnant. 0-2 drinks a day for men. Be aware of how much alcohol is in your drink. In the [place] one drink equals one 12 oz bottle of beer (355 mL), one 5 oz glass of wine (148 mL), or one 1 oz glass of hard liquor (44 mL). General information Avoid eating more than 2,300 mg of salt a day. If you have hypertension, you may need to reduce your sodium intake to 1,500 mg a day. Work with your health care provider to maintain a healthy body weight or to lose weight. Ask what an ideal weight is for  you. Get at least 30 minutes of exercise that causes your heart to beat faster (aerobic exercise) most days of the week. Activities may include walking, swimming, or biking. Work with your health care provider or dietitian to adjust your eating plan to your individual calorie needs. What foods should I eat? Fruits All fresh, dried, or frozen fruit. Canned fruit in natural juice (without added sugar). Vegetables Fresh or frozen vegetables (raw, steamed, roasted, or grilled). Low-sodium or  reduced-sodium tomato and vegetable juice. Low-sodium or reduced-sodium tomato sauce and tomato paste. Low-sodium or reduced-sodium canned vegetables. Grains Whole-grain or whole-wheat bread. Whole-grain or whole-wheat pasta. Brown rice. [place]. Bulgur. Whole-grain and low-sodium cereals. Pita bread. Low-fat, low-sodium crackers. Whole-wheat flour tortillas. Meats and other proteins Skinless chicken or [place]. Ground chicken or [place]. Pork with fat trimmed off. Fish and seafood. Egg whites. Dried beans, peas, or lentils. Unsalted nuts, nut butters, and seeds. Unsalted canned beans. Lean cuts of beef with fat trimmed off. Low-sodium, lean precooked or cured meat, such as sausages or meat loaves. Dairy Low-fat (1%) or fat-free (skim) milk. Reduced-fat, low-fat, or fat-free cheeses. Nonfat, low-sodium ricotta or cottage cheese. Low-fat or nonfat yogurt. Low-fat, low-sodium cheese. Fats and oils Soft margarine without trans fats. Vegetable oil. Reduced-fat, low-fat, or light mayonnaise and salad dressings (reduced-sodium). Canola, safflower, olive, avocado, soybean, and sunflower oils. Avocado. Seasonings and condiments Herbs. Spices. Seasoning mixes without salt. Other foods Unsalted popcorn and pretzels. Fat-free sweets. The items listed above may not be a complete list of foods and beverages you can eat. Contact a dietitian for more information. What foods should I avoid? Fruits Canned fruit in a light or heavy syrup. Fried fruit. Fruit in cream or butter sauce. Vegetables Creamed or fried vegetables. Vegetables in a cheese sauce. Regular canned vegetables (not low-sodium or reduced-sodium). Regular canned tomato sauce and paste (not low-sodium or reduced-sodium). Regular tomato and vegetable juice (not low-sodium or reduced-sodium). Angie Fava. Olives. Grains Baked goods made with fat, such as croissants, muffins, or some breads. Dry pasta or rice meal packs. Meats and other  proteins Fatty cuts of meat. Ribs. Fried meat. Berniece Salines. Bologna, salami, and other precooked or cured meats, such as sausages or meat loaves. Fat from the back of a pig (fatback). Bratwurst. Salted nuts and seeds. Canned beans with added salt. Canned or smoked fish. Whole eggs or egg yolks. Chicken or [place] with skin. Dairy Whole or 2% milk, cream, and half-and-half. Whole or full-fat cream cheese. Whole-fat or sweetened yogurt. Full-fat cheese. Nondairy creamers. Whipped toppings. Processed cheese and cheese spreads. Fats and oils Butter. Stick margarine. Lard. Shortening. Ghee. Bacon fat. Tropical oils, such as coconut, palm kernel, or palm oil. Seasonings and condiments Onion salt, garlic salt, seasoned salt, table salt, and sea salt. Worcestershire sauce. Tartar sauce. Barbecue sauce. Teriyaki sauce. Soy sauce, including reduced-sodium. Steak sauce. Canned and packaged gravies. Fish sauce. Oyster sauce. Cocktail sauce. Store-bought horseradish. Ketchup. Mustard. Meat flavorings and tenderizers. Bouillon cubes. Hot sauces. Pre-made or packaged marinades. Pre-made or packaged taco seasonings. Relishes. Regular salad dressings. Other foods Salted popcorn and pretzels. The items listed above may not be a complete list of foods and beverages you should avoid. Contact a dietitian for more information. Where to find more information National Heart, Lung, and Blood Institute: https://wilson-eaton.com/ American Heart Association: www.heart.org Academy of Nutrition and Dietetics: www.eatright.[place]: www.kidney.org Summary The DASH eating plan is a healthy eating plan that has been shown to reduce high blood pressure (hypertension). It may  also reduce your risk for type 2 diabetes, heart disease, and stroke. When on the DASH eating plan, aim to eat more fresh fruits and vegetables, whole grains, lean proteins, low-fat dairy, and heart-healthy fats. With the DASH eating plan, you should  limit salt (sodium) intake to 2,300 mg a day. If you have hypertension, you may need to reduce your sodium intake to 1,500 mg a day. Work with your health care provider or dietitian to adjust your eating plan to your individual calorie needs. This information is not intended to replace advice given to you by your health care provider. Make sure you discuss any questions you have with your health care provider. Document Revised: 07/08/2019 Document Reviewed: 07/08/2019 Elsevier Patient Education  2022 Downing Your Hypertension Hypertension, also called high blood pressure, is when the force of the blood pressing against the walls of the arteries is too strong. Arteries are blood vessels that carry blood from your heart throughout your body. Hypertension forces the heart to work harder to pump blood and may cause the arteries to become narrow or stiff. Understanding blood pressure readings Your personal target blood pressure may vary depending on your medical conditions, your age, and other factors. A blood pressure reading includes a higher number over a lower number. Ideally, your blood pressure should be below 120/80. You should know that: The first, or top, number is called the systolic pressure. It is a measure of the pressure in your arteries as your heart beats. The second, or bottom number, is called the diastolic pressure. It is a measure of the pressure in your arteries as the heart relaxes. Blood pressure is classified into four stages. Based on your blood pressure reading, your health care provider may use the following stages to determine what type of treatment you need, if any. Systolic pressure and diastolic pressure are measured in a unit called mmHg. Normal Systolic pressure: below 580. Diastolic pressure: below 80. Elevated Systolic pressure: 998-338. Diastolic pressure: below 80. Hypertension stage 1 Systolic pressure: 250-539. Diastolic pressure:  76-73. Hypertension stage 2 Systolic pressure: 419 or above. Diastolic pressure: 90 or above. How can this condition affect me? Managing your hypertension is an important responsibility. Over time, hypertension can damage the arteries and decrease blood flow to important parts of the body, including the brain, heart, and kidneys. Having untreated or uncontrolled hypertension can lead to: A heart attack. A stroke. A weakened blood vessel (aneurysm). Heart failure. Kidney damage. Eye damage. Metabolic syndrome. Memory and concentration problems. Vascular dementia. What actions can I take to manage this condition? Hypertension can be managed by making lifestyle changes and possibly by taking medicines. Your health care provider will help you make a plan to bring your blood pressure within a normal range. Nutrition  Eat a diet that is high in fiber and potassium, and low in salt (sodium), added sugar, and fat. An example eating plan is called the Dietary Approaches to Stop Hypertension (DASH) diet. To eat this way: Eat plenty of fresh fruits and vegetables. Try to fill one-half of your plate at each meal with fruits and vegetables. Eat whole grains, such as whole-wheat pasta, brown rice, or whole-grain bread. Fill about one-fourth of your plate with whole grains. Eat low-fat dairy products. Avoid fatty cuts of meat, processed or cured meats, and poultry with skin. Fill about one-fourth of your plate with lean proteins such as fish, chicken without skin, beans, eggs, and tofu. Avoid pre-made and processed foods. These tend to be  higher in sodium, added sugar, and fat. Reduce your daily sodium intake. Most people with hypertension should eat less than 1,500 mg of sodium a day. Lifestyle  Work with your health care provider to maintain a healthy body weight or to lose weight. Ask what an ideal weight is for you. Get at least 30 minutes of exercise that causes your heart to beat faster (aerobic  exercise) most days of the week. Activities may include walking, swimming, or biking. Include exercise to strengthen your muscles (resistance exercise), such as weight lifting, as part of your weekly exercise routine. Try to do these types of exercises for 30 minutes at least 3 days a week. Do not use any products that contain nicotine or tobacco, such as cigarettes, e-cigarettes, and chewing tobacco. If you need help quitting, ask your health care provider. Control any long-term (chronic) conditions you have, such as high cholesterol or diabetes. Identify your sources of stress and find ways to manage stress. This may include meditation, deep breathing, or making time for fun activities. Alcohol use Do not drink alcohol if: Your health care provider tells you not to drink. You are pregnant, may be pregnant, or are planning to become pregnant. If you drink alcohol: Limit how much you use to: 0-1 drink a day for women. 0-2 drinks a day for men. Be aware of how much alcohol is in your drink. In the [place] one drink equals one 12 oz bottle of beer (355 mL), one 5 oz glass of wine (148 mL), or one 1 oz glass of hard liquor (44 mL). Medicines Your health care provider may prescribe medicine if lifestyle changes are not enough to get your blood pressure under control and if: Your systolic blood pressure is 130 or higher. Your diastolic blood pressure is 80 or higher. Take medicines only as told by your health care provider. Follow the directions carefully. Blood pressure medicines must be taken as told by your health care provider. The medicine does not work as well when you skip doses. Skipping doses also puts you at risk for problems. Monitoring Before you monitor your blood pressure: Do not smoke, drink caffeinated beverages, or exercise within 30 minutes before taking a measurement. Use the bathroom and empty your bladder (urinate). Sit quietly for at least 5 minutes before taking  measurements. Monitor your blood pressure at home as told by your health care provider. To do this: Sit with your back straight and supported. Place your feet flat on the floor. Do not cross your legs. Support your arm on a flat surface, such as a table. Make sure your upper arm is at heart level. Each time you measure, take two or three readings one minute apart and record the results. You may also need to have your blood pressure checked regularly by your health care provider. General information Talk with your health care provider about your diet, exercise habits, and other lifestyle factors that may be contributing to hypertension. Review all the medicines you take with your health care provider because there may be side effects or interactions. Keep all visits as told by your health care provider. Your health care provider can help you create and adjust your plan for managing your high blood pressure. Where to find more information National Heart, Lung, and Blood Institute: https://wilson-eaton.com/ American Heart Association: www.heart.org Contact a health care provider if: You think you are having a reaction to medicines you have taken. You have repeated (recurrent) headaches. You feel dizzy. You have swelling  in your ankles. You have trouble with your vision. Get help right away if: You develop a severe headache or confusion. You have unusual weakness or numbness, or you feel faint. You have severe pain in your chest or abdomen. You vomit repeatedly. You have trouble breathing. These symptoms may represent a serious problem that is an emergency. Do not wait to see if the symptoms will go away. Get medical help right away. Call your local emergency services (911 in the [place] Do not drive yourself to the hospital. Summary Hypertension is when the force of blood pumping through your arteries is too strong. If this condition is not controlled, it may put you at risk for serious  complications. Your personal target blood pressure may vary depending on your medical conditions, your age, and other factors. For most people, a normal blood pressure is less than 120/80. Hypertension is managed by lifestyle changes, medicines, or both. Lifestyle changes to help manage hypertension include losing weight, eating a healthy, low-sodium diet, exercising more, stopping smoking, and limiting alcohol. This information is not intended to replace advice given to you by your health care provider. Make sure you discuss any questions you have with your health care provider. Document Revised: 08/22/2019 Document Reviewed: 07/05/2019 Elsevier Patient Education  2022 [employer].

## 2021-07-16 NOTE — Progress Notes (Signed)
Subjective:    Patient ID: JR MILLIRON, male    DOB: 07/14/1966, 55 y.o.   MRN: [id number]  55y/o African American established male pt presenting for f/u HTN, medication changes; started on hydrochlorothiazide 12.20m [facility] for hypertension.  Has been seeing RN HHildred Alaminfor BP checks and still not at goal.  Today with headache and he noticed had to wear his reading glasses more often at work today for blurred vision that corrects with glasses wear.  Patient instructed at 0930 to take second dose hydrochlorothiazide 12.524mand return to clinic in 2 hours for repeat BP and evaluation/sooner if worsening symptoms.  Patient did take second dose hydrochlorothiazide but due to RN/NP HR meeting and patient lunch re-evaluated at 1330.  Patient denied hand/feet swelling, chest pain, n/v, shortness of breath, dyspnea on exertion, facial droop, extremity weakness, dysphagia/dysphasia.  Patient just finished lunch break po intake without difficulty.     Review of Systems  Constitutional:  Negative for activity change, appetite change, chills, diaphoresis, fatigue and fever.  HENT:  Negative for trouble swallowing and voice change.   Eyes:  Positive for visual disturbance. Negative for photophobia, pain, discharge, redness and itching.  Respiratory:  Negative for cough, chest tightness, shortness of breath, wheezing and stridor.   Cardiovascular:  Negative for chest pain and palpitations.  Gastrointestinal:  Negative for diarrhea, nausea and vomiting.  Endocrine: Negative for cold intolerance and heat intolerance.  Genitourinary:  Negative for difficulty urinating.  Musculoskeletal:  Negative for arthralgias, back pain, gait problem, joint swelling, myalgias, neck pain and neck stiffness.  Skin:  Negative for rash.  Allergic/Immunologic: Negative for food allergies.  Neurological:  Positive for headaches. Negative for dizziness, tremors, seizures, syncope, facial asymmetry, speech difficulty,  weakness, light-headedness and numbness.  Hematological:  Negative for adenopathy. Does not bruise/bleed easily.  Psychiatric/Behavioral:  Negative for agitation, confusion and sleep disturbance.       Objective:   Physical Exam Vitals and nursing note reviewed.  Constitutional:      General: He is awake. He is not in acute distress.    Appearance: Normal appearance. He is well-developed, well-groomed and normal weight. He is not ill-appearing, toxic-appearing or diaphoretic.  HENT:     Head: Normocephalic and atraumatic.     Jaw: There is normal jaw occlusion.     Salivary Glands: Right salivary gland is not diffusely enlarged. Left salivary gland is not diffusely enlarged.     Right Ear: Hearing and external ear normal.     Left Ear: Hearing and external ear normal.     Nose: Nose normal. No congestion or rhinorrhea.     Mouth/Throat:     Lips: Pink. No lesions.     Mouth: Mucous membranes are moist. No oral lesions or angioedema.     Dentition: No gum lesions.     Pharynx: Oropharynx is clear. Uvula midline.  Eyes:     General: Lids are normal. Vision grossly intact. Gaze aligned appropriately. No allergic shiner or scleral icterus.       Right eye: No discharge.        Left eye: No discharge.     Extraocular Movements: Extraocular movements intact.     Conjunctiva/sclera: Conjunctivae normal.     Pupils: Pupils are equal, round, and reactive to light.  Neck:     Trachea: Trachea and phonation normal. No tracheal deviation.  Cardiovascular:     Rate and Rhythm: Normal rate and regular rhythm.  Pulses: Normal pulses.          Radial pulses are 2+ on the right side and 2+ on the left side.     Heart sounds: Normal heart sounds.  Pulmonary:     Effort: Pulmonary effort is normal. No respiratory distress.     Breath sounds: Normal breath sounds and air entry. No stridor or transmitted upper airway sounds. No wheezing.     Comments: Spoke full sentences without difficulty; no  cough observed in exam room Abdominal:     General: Abdomen is flat.     Palpations: Abdomen is soft.  Musculoskeletal:        General: Normal range of motion.     Cervical back: Normal range of motion and neck supple. No edema, erythema, signs of trauma, rigidity, torticollis or crepitus. No pain with movement. Normal range of motion.     Right lower leg: No edema.     Left lower leg: No edema.  Lymphadenopathy:     Head:     Right side of head: No submandibular or preauricular adenopathy.     Left side of head: No submandibular or preauricular adenopathy.     Cervical: No cervical adenopathy.     Right cervical: No superficial cervical adenopathy.    Left cervical: No superficial cervical adenopathy.  Skin:    General: Skin is warm and dry.     Capillary Refill: Capillary refill takes less than 2 seconds.     Coloration: Skin is not ashen, cyanotic, jaundiced, mottled, pale or sallow.     Findings: No abrasion, bruising, burn, erythema, signs of injury, laceration, lesion, petechiae, rash or wound.  Neurological:     General: No focal deficit present.     Mental Status: He is alert and oriented to person, place, and time. Mental status is at baseline.     GCS: GCS eye subscore is 4. GCS verbal subscore is 5. GCS motor subscore is 6.     Cranial Nerves: Cranial nerves 2-12 are intact. No cranial nerve deficit, dysarthria or facial asymmetry.     Sensory: Sensation is intact.     Motor: Motor function is intact. No weakness, tremor, atrophy, abnormal muscle tone or seizure activity.     Coordination: Coordination is intact. Coordination normal.     Gait: Gait is intact. Gait normal.     Comments: In/out of chair without difficulty; gait sure and steady in clinic; bilateral hand grasp equal 5/5  Psychiatric:        Attention and Perception: Attention and perception normal.        Mood and Affect: Mood and affect normal.        Speech: Speech normal.        Behavior: Behavior normal.  Behavior is cooperative.        Thought Content: Thought content normal.        Cognition and Memory: Cognition and memory normal.        Judgment: Judgment normal.     Latest Reference Range & Units 06/28/21 09:08  Sodium 134 - 144 mmol/L 137  Potassium 3.5 - 5.2 mmol/L 4.6  Chloride 96 - 106 mmol/L 98  Glucose 70 - 99 mg/dL 79  BUN 6 - 24 mg/dL 8  Creatinine 0.76 - 1.27 mg/dL 1.02  Calcium 8.7 - 10.2 mg/dL 9.3  BUN/Creatinine Ratio 9 - 20  8 (L)  eGFR >59 mL/min/1.73 87  Phosphorus 2.8 - 4.1 mg/dL 3.5  Alkaline Phosphatase 44 -  121 IU/L 59  Albumin 3.8 - 4.9 g/dL 4.5  Albumin/Globulin Ratio 1.2 - 2.2  1.7  Uric Acid 3.8 - 8.4 mg/dL 5.6  AST 0 - 40 IU/L 25  ALT 0 - 44 IU/L 17  Total Protein 6.0 - 8.5 g/dL 7.1  Total Bilirubin 0.0 - 1.2 mg/dL 0.4  GGT 0 - 65 IU/L 20  Estimated CHD Risk 0.0 - 1.0 times avg. 0.7  LDH 121 - 224 IU/L 183  Total CHOL/HDL Ratio 0.0 - 5.0 ratio 4.0  Cholesterol, Total 100 - 199 mg/dL 226 (H)  HDL Cholesterol >39 mg/dL 57  Triglycerides 0 - 149 mg/dL 68  VLDL Cholesterol Cal 5 - 40 mg/dL 12  LDL Chol Calc (NIH) 0 - 99 mg/dL 157 (H)  Iron 38 - 169 ug/dL 100  Vitamin D, 25-Hydroxy 30.0 - 100.0 ng/mL 26.8 (L)  Globulin, Total 1.5 - 4.5 g/dL 2.6  WBC 3.4 - 10.8 x10E3/uL 2.8 (L)  RBC 4.14 - 5.80 x10E6/uL 4.85  Hemoglobin 13.0 - 17.7 g/dL 15.0  HCT 37.5 - 51.0 % 44.2  MCV 79 - 97 fL 91  MCH 26.6 - 33.0 pg 30.9  MCHC 31.5 - 35.7 g/dL 33.9  RDW 11.6 - 15.4 % 13.0  Platelets 150 - 450 x10E3/uL 208  Neutrophils Not Estab. % 38  Immature Granulocytes Not Estab. % 0  NEUT# 1.4 - 7.0 x10E3/uL 1.1 (L)  Lymphocyte # 0.7 - 3.1 x10E3/uL 1.3  Monocytes Absolute 0.1 - 0.9 x10E3/uL 0.4  Basophils Absolute 0.0 - 0.2 x10E3/uL 0.0  Immature Grans (Abs) 0.0 - 0.1 x10E3/uL 0.0  Lymphs Not Estab. % 46  Monocytes Not Estab. % 14  Basos Not Estab. % 1  Eos Not Estab. % 1  EOS (ABSOLUTE) 0.0 - 0.4 x10E3/uL 0.0  Hemoglobin A1C 4.8 - 5.6 % 5.8 (H)  TSH 0.450 -  4.500 uIU/mL 1.340  Thyroxine (T4) 4.5 - 12.0 ug/dL 6.4  Free Thyroxine Index 1.2 - 4.9  1.5  T3 Uptake Ratio 24 - 39 % 24  (L): Data is abnormally low (H): Data is abnormally high      Assessment & Plan:   A-essential hypertension  P-Increase Hydrochlorothiazide to 73m po qam.  Renal function and potassium normal on recent labs.  Follow up in 48 hours for repeat blood pressure/recheck.  If having headache later today or tomorrow start amlodipine 516mpo daily #90 RF0 dispensed to patient today from [facility].  Continue to monitor blood pressure with RN HaHildred Alaminontinue low sodium/DASH diet and exercise program 150 minutes exercise/activity per week.  Recommended weight maintenance to BMI 20-25. Return to the clinic if any new symptoms/notify clinic staff if visual changes, frequent headache, chest pain or dyspnea on mild or  minimal exertion. One serving caffeine per day and none this afternoon.  Discussed BP goal 100-120/60-70s.  Start amlodipine 1m43mf BP continues higher than 130s/70s and continue hydrochlorothiazide.  Will recheck BMET in 2 weeks.  Exitcare handouts on managing hypertension and dash diet.  Patient verbalized agreement and understanding of treatment plan and had no further questions at this time. P2: Diet and Exercise specific for HTN

## 2021-07-18 ENCOUNTER — Ambulatory Visit: Payer: No Typology Code available for payment source | Admitting: Registered Nurse

## 2021-07-18 ENCOUNTER — Other Ambulatory Visit: Payer: Self-pay

## 2021-07-18 VITALS — BP 148/81 | HR 82 | Temp 97.5°F | Wt 144.0 lb

## 2021-07-18 DIAGNOSIS — I10 Essential (primary) hypertension: Secondary | ICD-10-CM

## 2021-07-18 DIAGNOSIS — R202 Paresthesia of skin: Secondary | ICD-10-CM

## 2021-07-18 NOTE — Progress Notes (Signed)
Subjective:    Patient ID: Grant Wilson, male    DOB: Oct 16, 1965, 55 y.o.   MRN: [id number]  55y/o African American established male pt presenting for f/u of HTN management. Has been taking the increased dose of Hydrochlorothiazide 25mg  since Tuesday. Has not started Amlodipine as no worsening sx. Had mild HA yesterday but had not caffeine/coffee yet, had some coffee and HA resolved. Took Hydrochlorothiazide 12.5mg  at 0715 and then second 12.5mg  at 1015. Advised he can take the two pills together.   Patient reported abdomen pain when lying on his side has improved but not resolved completely.  Noticing some burning sensation in feet now at night also.  Blurred vision computer screens/near improved and doesn't need his reading glasses since starting hydrochlorothiazide 25mg .  Stated weight has remained stable, denied chest pain, fatigue, weakness, dizziness, palpations, shortness of breath, dyspnea with exertion or leg cramps/flank pain.  Urinating without difficulty.  Patient taking his blood pressure at home top number typically 130-140s.  Cannot remember bottom number.  Eating salads, squash, beans on regular basis with rice.     Review of Systems  Constitutional:  Negative for activity change, appetite change, chills, diaphoresis, fatigue and fever.  HENT:  Negative for trouble swallowing and voice change.   Eyes:  Positive for visual disturbance. Negative for photophobia, pain, discharge, redness and itching.  Respiratory:  Negative for cough, shortness of breath, wheezing and stridor.   Cardiovascular:  Negative for chest pain, palpitations and leg swelling.  Gastrointestinal:  Negative for abdominal pain, diarrhea, nausea and vomiting.  Endocrine: Negative for cold intolerance and heat intolerance.  Genitourinary:  Negative for difficulty urinating.  Musculoskeletal:  Negative for gait problem, joint swelling, neck pain and neck stiffness.  Skin:  Negative for rash.   Allergic/Immunologic: Negative for food allergies.  Neurological:  Positive for headaches. Negative for dizziness, tremors, seizures, syncope, facial asymmetry, speech difficulty, weakness and light-headedness.  Hematological:  Negative for adenopathy. Does not bruise/bleed easily.  Psychiatric/Behavioral:  Negative for agitation and confusion.       Objective:   Physical Exam Vitals and nursing note reviewed.  Constitutional:      General: He is awake. He is not in acute distress.    Appearance: Normal appearance. He is well-developed, well-groomed and normal weight. He is not ill-appearing, toxic-appearing or diaphoretic.  HENT:     Head: Normocephalic and atraumatic.     Jaw: There is normal jaw occlusion.     Salivary Glands: Right salivary gland is not diffusely enlarged. Left salivary gland is not diffusely enlarged.     Right Ear: Hearing and external ear normal.     Left Ear: Hearing and external ear normal.     Nose: Nose normal. No congestion or rhinorrhea.     Mouth/Throat:     Lips: Pink. No lesions.     Mouth: Mucous membranes are moist.     Pharynx: Oropharynx is clear.  Eyes:     General: Lids are normal. Vision grossly intact. Gaze aligned appropriately. No allergic shiner, visual field deficit or scleral icterus.       Right eye: No discharge.        Left eye: No discharge.     Extraocular Movements: Extraocular movements intact.     Right eye: Normal extraocular motion and no nystagmus.     Left eye: Normal extraocular motion and no nystagmus.     Conjunctiva/sclera: Conjunctivae normal.     Right eye: Right conjunctiva is not  injected. No chemosis, exudate or hemorrhage.    Left eye: Left conjunctiva is not injected. No chemosis, exudate or hemorrhage.    Pupils: Pupils are equal, round, and reactive to light.  Neck:     Vascular: No carotid bruit.     Trachea: Trachea and phonation normal. No tracheal tenderness or tracheal deviation.  Cardiovascular:      Rate and Rhythm: Normal rate and regular rhythm.     Pulses: Normal pulses.     Heart sounds: Normal heart sounds. No murmur heard.   No friction rub. No gallop.  Pulmonary:     Effort: Pulmonary effort is normal. No respiratory distress.     Breath sounds: Normal breath sounds and air entry. No stridor or transmitted upper airway sounds. No wheezing, rhonchi or rales.     Comments: Spoke full sentences without difficulty; no cough observed in exam room Abdominal:     General: Abdomen is flat.     Palpations: Abdomen is soft.     Tenderness: There is no abdominal tenderness. There is no guarding.  Musculoskeletal:        General: Normal range of motion.     Right elbow: No swelling, deformity or effusion. Normal range of motion.     Left elbow: No swelling, deformity or effusion. Normal range of motion.     Right hand: No swelling, deformity or lacerations. Normal strength.     Left hand: No swelling, deformity or lacerations. Normal strength.     Cervical back: Normal range of motion and neck supple. No swelling, edema, deformity, erythema, signs of trauma, lacerations, rigidity, torticollis or crepitus. No pain with movement. Normal range of motion.     Thoracic back: No swelling, edema, deformity, signs of trauma or lacerations.     Lumbar back: No swelling, edema, deformity, signs of trauma or lacerations.     Right lower leg: No swelling, deformity, lacerations, tenderness or bony tenderness. No edema.     Left lower leg: No swelling, deformity, lacerations, tenderness or bony tenderness. No edema.     Right ankle: No swelling, deformity, ecchymosis or lacerations. No tenderness. Normal range of motion.     Left ankle: No swelling, deformity, ecchymosis or lacerations. No tenderness. Normal range of motion.  Lymphadenopathy:     Head:     Right side of head: No submandibular or preauricular adenopathy.     Left side of head: No submandibular or preauricular adenopathy.     Cervical:  No cervical adenopathy.     Right cervical: No superficial cervical adenopathy.    Left cervical: No superficial cervical adenopathy.  Skin:    General: Skin is warm and dry.     Capillary Refill: Capillary refill takes less than 2 seconds.     Coloration: Skin is not ashen, cyanotic, jaundiced, mottled, pale or sallow.     Findings: No abrasion, abscess, acne, bruising, burn, ecchymosis, erythema, signs of injury, laceration, lesion, petechiae, rash or wound.     Nails: There is no clubbing.  Neurological:     General: No focal deficit present.     Mental Status: He is alert and oriented to person, place, and time. Mental status is at baseline.     GCS: GCS eye subscore is 4. GCS verbal subscore is 5. GCS motor subscore is 6.     Cranial Nerves: Cranial nerves 2-12 are intact. No cranial nerve deficit, dysarthria or facial asymmetry.     Sensory: Sensation is intact. No sensory  deficit.     Motor: Motor function is intact. No weakness, tremor, atrophy, abnormal muscle tone or seizure activity.     Coordination: Coordination is intact. Coordination normal.     Gait: Gait is intact. Gait normal.     Comments: In/out of chair without difficulty; gait sure and steady in clinic; bilateral hand grasp equal 5/5  Psychiatric:        Attention and Perception: Attention and perception normal.        Mood and Affect: Mood and affect normal.        Speech: Speech normal.        Behavior: Behavior normal. Behavior is cooperative.        Thought Content: Thought content normal.        Cognition and Memory: Cognition and memory normal.        Judgment: Judgment normal.          Assessment & Plan:   A-essential hypertension, paresthesias  P-patient to start taking both hydrochlorothiazide 12.5mg  capsules at the same time in am.  If blood pressure remaining systolic greater than 119 or diastolic greater than 80 to start amlodipine 5mg  po daily in evening.  Patient to follow up next week with RN  Hildred Alamin for repeat blood pressure check.  Continue his log at home and bring in his machine for comparison with clinic equipment readings.  DASH diet.  Patient to schedule routine eye appt with optometry and establish with new PCM as his office at [facility] closed. Patient verbalized understanding information/instructions, agreed with plan of care and had no further questions at this time.  Labs in 2 weeks nonfasting BMET, Vitamin B6 and B12.  History of elevated Hgba1c most recent 5.8 this year. Next Hgba1c due Feb 2023.  Recommended scheduling appt with [facility] dietitian and given information on how to do so.   Patient leans toward vegetarian diet.  Will also check if low potassium due to new hydrochlorothiazide use.  TSH normal.  Consider lyme disease testing.  Exitcare handout on paresthesias.  Patient verbalized understanding information/instructions, agreed with plan of care and had no further questions at this time.

## 2021-07-18 NOTE — Patient Instructions (Signed)
Paresthesia Paresthesia is an abnormal burning or prickling sensation. It is usually felt in the hands, arms, legs, or feet. However, it may occur in any part of the body. Usually, paresthesia is not painful. It may feel like: Tingling or numbness. Buzzing. Itching. Paresthesia may occur without any clear cause, or it may be caused by: Breathing too quickly (hyperventilation). Pressure on a nerve. An underlying medical condition. Side effects of a medication. Nutritional deficiencies. Exposure to toxic chemicals. Most people experience temporary (transient) paresthesia at some time in their lives. For some people, it may be long-lasting (chronic) because of an underlying medical condition. If you have paresthesia that lasts a long time, you need to be evaluated by your health care provider. Follow these instructions at home: Alcohol use  Do not drink alcohol if: Your health care provider tells you not to drink. You are pregnant, may be pregnant, or are planning to become pregnant. If you drink alcohol: Limit how much you use to: 0-1 drink a day for women. 0-2 drinks a day for men. Be aware of how much alcohol is in your drink. In the [place] one drink equals one 12 oz bottle of beer (355 mL), one 5 oz glass of wine (148 mL), or one 1 oz glass of hard liquor (44 mL). Nutrition  Eat a healthy diet. This includes: Eating foods that are high in fiber, such as fresh fruits and vegetables, whole grains, and beans. Limiting foods that are high in fat and processed sugars, such as fried or sweet foods. General instructions Take over-the-counter and prescription medicines only as told by your health care provider. Do not use any products that contain nicotine or tobacco, such as cigarettes and e-cigarettes. These can keep blood from reaching damaged nerves. If you need help quitting, ask your health care provider. If you have diabetes, work closely with your health care provider to keep your  blood sugar under control. If you have numbness in your feet: Check every day for signs of injury or infection. Watch for redness, warmth, and swelling. Wear padded socks and comfortable shoes. These help protect your feet. Keep all follow-up visits as told by your health care provider. This is important. Contact a health care provider if you: Have paresthesia that gets worse or does not go away. Have numbness after an injury. Have a burning or prickling feeling that gets worse when you walk. Have pain, cramps, or dizziness, or you faint. Develop a rash. Get help right away if you: Feel muscle weakness. Develop new weakness in an arm or leg. Have trouble walking or moving. Have problems with speech, understanding, or vision. Feel confused. Cannot control your bladder or bowel movements. Summary Paresthesia is an abnormal burning or prickling sensation that is usually felt in the hands, arms, legs, or feet. It may also occur in other parts of the body. Paresthesia may occur without any clear cause, or it may be caused by breathing too quickly (hyperventilation), pressure on a nerve, an underlying medical condition, side effects of a medication, nutritional deficiencies, or exposure to toxic chemicals. If you have paresthesia that lasts a long time, you need to be evaluated by your health care provider. This information is not intended to replace advice given to you by your health care provider. Make sure you discuss any questions you have with your health care provider. Document Revised: 05/15/2020 Document Reviewed: 05/15/2020 Elsevier Patient Education  2022 [employer].

## 2021-07-19 NOTE — Telephone Encounter (Signed)
Patient seen in clinic and hydrochlorothiazide increased to 25mg  daily; follow up labs scheduled in 2 weeks. Dispensed amlodipine 5mg  po daily also but has not yet started.  Discussed if headache to start amlodipine daily in the afternoon. See office notes 07/16/21 and 07/18/21

## 2021-07-22 ENCOUNTER — Other Ambulatory Visit: Payer: Self-pay

## 2021-07-22 ENCOUNTER — Ambulatory Visit: Payer: Self-pay | Admitting: *Deleted

## 2021-07-22 VITALS — BP 154/74

## 2021-07-22 DIAGNOSIS — I10 Essential (primary) hypertension: Secondary | ICD-10-CM

## 2021-07-22 NOTE — Progress Notes (Signed)
Pt brings home BP cuff to clinic to have compared to manual reading to ensure cuff is reading accurately. Reading at desk with no activity 15 min prior to clinic visit was 129/70. 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

## 2021-07-29 ENCOUNTER — Other Ambulatory Visit: Payer: Self-pay

## 2021-07-29 ENCOUNTER — Other Ambulatory Visit: Payer: Self-pay | Admitting: *Deleted

## 2021-07-29 VITALS — BP 152/77 | HR 102

## 2021-07-29 DIAGNOSIS — R202 Paresthesia of skin: Secondary | ICD-10-CM

## 2021-07-29 DIAGNOSIS — D72819 Decreased white blood cell count, unspecified: Secondary | ICD-10-CM

## 2021-07-29 DIAGNOSIS — E559 Vitamin D deficiency, unspecified: Secondary | ICD-10-CM

## 2021-07-29 DIAGNOSIS — R7303 Prediabetes: Secondary | ICD-10-CM

## 2021-07-29 DIAGNOSIS — I10 Essential (primary) hypertension: Secondary | ICD-10-CM

## 2021-07-29 NOTE — Progress Notes (Signed)
Labs and routine BP check.

## 2021-07-30 ENCOUNTER — Encounter: Payer: Self-pay | Admitting: Registered Nurse

## 2021-07-30 DIAGNOSIS — I10 Essential (primary) hypertension: Secondary | ICD-10-CM

## 2021-07-31 LAB — BASIC METABOLIC PANEL
BUN/Creatinine Ratio: 8 — ABNORMAL LOW (ref 9–20)
BUN: 7 mg/dL (ref 6–24)
CO2: 25 mmol/L (ref 20–29)
Calcium: 9.5 mg/dL (ref 8.7–10.2)
Chloride: 90 mmol/L — ABNORMAL LOW (ref 96–106)
Creatinine, Ser: 0.91 mg/dL (ref 0.76–1.27)
Glucose: 86 mg/dL (ref 70–99)
Potassium: 4.2 mmol/L (ref 3.5–5.2)
Sodium: 130 mmol/L — ABNORMAL LOW (ref 134–144)
eGFR: 100 mL/min/{1.73_m2} (ref >59)

## 2021-07-31 LAB — CBC WITH DIFFERENTIAL/PLATELET
Basophils Absolute: 0 10*3/uL (ref 0.0–0.2)
Basos: 1 %
EOS (ABSOLUTE): 0 10*3/uL (ref 0.0–0.4)
Eos: 0 %
Hematocrit: 43.6 % (ref 37.5–51.0)
Hemoglobin: 15.2 g/dL (ref 13.0–17.7)
Immature Grans (Abs): 0 10*3/uL (ref 0.0–0.1)
Immature Granulocytes: 0 %
Lymphocytes Absolute: 1.2 10*3/uL (ref 0.7–3.1)
Lymphs: 40 %
MCH: 31.6 pg (ref 26.6–33.0)
MCHC: 34.9 g/dL (ref 31.5–35.7)
MCV: 91 fL (ref 79–97)
Monocytes Absolute: 0.3 10*3/uL (ref 0.1–0.9)
Monocytes: 12 %
Neutrophils Absolute: 1.4 10*3/uL (ref 1.4–7.0)
Neutrophils: 47 %
Platelets: 282 10*3/uL (ref 150–450)
RBC: 4.81 x10E6/uL (ref 4.14–5.80)
RDW: 12.8 % (ref 11.6–15.4)
WBC: 3 10*3/uL — ABNORMAL LOW (ref 3.4–10.8)

## 2021-07-31 LAB — B12 AND FOLATE PANEL
Folate: 15.6 ng/mL (ref >3.0)
Vitamin B-12: 1053 pg/mL (ref 232–1245)

## 2021-07-31 LAB — VITAMIN B6: Vitamin B6: 136 ug/L — ABNORMAL HIGH (ref 3.4–65.2)

## 2021-07-31 LAB — VITAMIN D 25 HYDROXY (VIT D DEFICIENCY, FRACTURES): Vit D, 25-Hydroxy: 31.6 ng/mL (ref 30.0–100.0)

## 2021-07-31 LAB — HGB A1C W/O EAG: Hgb A1c MFr Bld: 5.8 % — ABNORMAL HIGH (ref 4.8–5.6)

## 2021-07-31 NOTE — Addendum Note (Signed)
Addended by: Gerarda Fraction A on: 07/31/2021 08:40 AM   Modules accepted: Orders

## 2021-08-02 NOTE — Telephone Encounter (Signed)
Labs completed 07/29/21 and will repeat in 1 month due to hyponatremia from diet change and hydrochlorothiazide.  Discussed with patient to add a little salt back into diet but to keep under 2300mg  or 1 teaspoon added salt per day.  See lab note.  Renal function normal.  Patient verbalized understanding information/instructions and had no further questions at that time.

## 2021-08-08 ENCOUNTER — Ambulatory Visit: Payer: Self-pay | Admitting: *Deleted

## 2021-08-08 VITALS — BP 136/72 | HR 82

## 2021-08-08 DIAGNOSIS — I10 Essential (primary) hypertension: Secondary | ICD-10-CM

## 2021-08-08 NOTE — Progress Notes (Signed)
Stopped B12 supplement after labs reviewed. Appt made for recheck in January.

## 2021-08-14 NOTE — Telephone Encounter (Signed)
Late entry  Patient seen in clinic 08/08/21 during visit for BP check with RN Hildred Alamin.  Patient reported reviewed lab results note in my chart and realized that one of the new supplements he started had B6 in it and he had increased dose this month so stopped it completely and now feet paresthesias have resolved and feeling better.  He has scheduled lab draw with RN Hildred Alamin for repeat B6 level in Jan 2023.  Blood pressure improved 136/72 but not at goal.  Will continue to monitor trend as recently stopped dietary supplements and he is continuing to work on diet choices.  Patient A&Ox3 spoke full sentences without difficulty gait sure and steady in clinic, respirations even and unlabored skin warm dry and pink.  In/out of chair without difficulty.  Bilateral hand grasp equal 5/5

## 2021-09-02 ENCOUNTER — Other Ambulatory Visit: Payer: Self-pay

## 2021-09-02 ENCOUNTER — Other Ambulatory Visit: Payer: Self-pay | Admitting: *Deleted

## 2021-09-02 DIAGNOSIS — I10 Essential (primary) hypertension: Secondary | ICD-10-CM

## 2021-09-02 DIAGNOSIS — R202 Paresthesia of skin: Secondary | ICD-10-CM

## 2021-09-02 NOTE — Progress Notes (Signed)
Nonfasting labs per future/held orders.

## 2021-09-03 ENCOUNTER — Telehealth: Payer: Self-pay | Admitting: Registered Nurse

## 2021-09-03 DIAGNOSIS — E162 Hypoglycemia, unspecified: Secondary | ICD-10-CM

## 2021-09-03 LAB — SPECIMEN STATUS REPORT

## 2021-09-03 NOTE — Telephone Encounter (Signed)
Patient with hypoglycemia on nonfasting BMET.  Will have patient check POCT glucose with RN Hildred Alamin 3 times over the next month to monitor for trends.  Exitcare handout on hypoglycemia sent to patient my chart.

## 2021-09-03 NOTE — Progress Notes (Signed)
Confirmed with pt that sample was non-fasting. Approx one hr prior to draw had toast with peanut butter and water.

## 2021-09-06 LAB — BASIC METABOLIC PANEL
BUN/Creatinine Ratio: 8 — ABNORMAL LOW (ref 9–20)
BUN: 8 mg/dL (ref 6–24)
CO2: 27 mmol/L (ref 20–29)
Calcium: 9.5 mg/dL (ref 8.7–10.2)
Chloride: 96 mmol/L (ref 96–106)
Creatinine, Ser: 0.98 mg/dL (ref 0.76–1.27)
Glucose: 58 mg/dL — ABNORMAL LOW (ref 70–99)
Potassium: 4 mmol/L (ref 3.5–5.2)
Sodium: 138 mmol/L (ref 134–144)
eGFR: 91 mL/min/{1.73_m2} (ref >59)

## 2021-09-06 LAB — VITAMIN B6: Vitamin B6: 22.2 ug/L (ref 3.4–65.2)

## 2021-09-09 ENCOUNTER — Other Ambulatory Visit: Payer: Self-pay

## 2021-09-09 ENCOUNTER — Ambulatory Visit: Payer: Self-pay | Admitting: *Deleted

## 2021-09-09 DIAGNOSIS — E162 Hypoglycemia, unspecified: Secondary | ICD-10-CM

## 2021-09-09 LAB — GLUCOSE, POCT (MANUAL RESULT ENTRY): POC Glucose: 101 mg/dl — AB (ref 70–99)

## 2021-09-09 NOTE — Progress Notes (Signed)
Spot glucose check per NP request. Checked at 1005, 101. Ate breakfast [id number], toast with peanut butter and fresh fruit of strawberries and blackberries.

## 2021-09-17 ENCOUNTER — Telehealth: Payer: Self-pay | Admitting: Registered Nurse

## 2021-09-17 ENCOUNTER — Other Ambulatory Visit: Payer: Self-pay

## 2021-09-17 ENCOUNTER — Ambulatory Visit: Payer: Self-pay | Admitting: *Deleted

## 2021-09-17 ENCOUNTER — Encounter: Payer: Self-pay | Admitting: Registered Nurse

## 2021-09-17 VITALS — BP 159/93 | HR 79

## 2021-09-17 DIAGNOSIS — E162 Hypoglycemia, unspecified: Secondary | ICD-10-CM

## 2021-09-17 DIAGNOSIS — I10 Essential (primary) hypertension: Secondary | ICD-10-CM

## 2021-09-17 LAB — GLUCOSE, POCT (MANUAL RESULT ENTRY): POC Glucose: 84 mg/dl (ref 70–99)

## 2021-09-17 MED ORDER — HYDROCHLOROTHIAZIDE 12.5 MG PO CAPS: 25.0000 mg | ORAL_CAPSULE | Freq: Every day | ORAL | 0 refills | Status: DC

## 2021-09-17 NOTE — Telephone Encounter (Signed)
160/96 BP in clinic today.  Patient reported ran out of hydrochlorothiazide and was going to try and do things naturally to lower blood pressure.  Not taking amlodipine either still has tabs at home.  Patient encouraged to come to clinic for BP check elevated discussed stroke/heart attack concern with elevated blood pressure.  Patient agreed to restart hydrochlorothiazide dispensed 2 week supply from [facility] to patient #30 RF0 12.5mg  take 2 tabs po daily.  Patient to follow up next week for repeat blood pressure check with RN Hildred Alamin.  POCT glucose nonfasting WNL today.  Patient feeling well denied headache/dyspnea/chest pain/visual changes.  Gait sure and steady respirations even and unlabored RA.  Patient agreed with plan of care and had no further questions at this time.

## 2021-09-17 NOTE — Progress Notes (Signed)
CBG check as per NP Otila Kluver orders. Pt last ate more than 3 hours ago. Having lunch in 30 minutes. CBG 84. Feels well.

## 2021-09-30 ENCOUNTER — Ambulatory Visit: Payer: Self-pay | Admitting: *Deleted

## 2021-09-30 ENCOUNTER — Other Ambulatory Visit: Payer: Self-pay

## 2021-09-30 DIAGNOSIS — E162 Hypoglycemia, unspecified: Secondary | ICD-10-CM

## 2021-09-30 LAB — GLUCOSE, POCT (MANUAL RESULT ENTRY): POC Glucose: 106 mg/dl — AB (ref 70–99)

## 2021-09-30 NOTE — Progress Notes (Signed)
CBG check NP Tina's orders. Third check. Ate lunch 2 hours prior to arrival. CBG 106.

## 2021-10-01 ENCOUNTER — Ambulatory Visit: Payer: Self-pay | Admitting: Registered Nurse

## 2021-10-01 ENCOUNTER — Encounter: Payer: Self-pay | Admitting: Registered Nurse

## 2021-10-01 VITALS — BP 132/78 | HR 88 | Temp 97.3°F

## 2021-10-01 DIAGNOSIS — M545 Low back pain, unspecified: Secondary | ICD-10-CM

## 2021-10-01 MED ORDER — IBUPROFEN 800 MG PO TABS: 800.0000 mg | ORAL_TABLET | Freq: Three times a day (TID) | ORAL | 0 refills | Status: DC

## 2021-10-01 MED ORDER — BIOFREEZE 4 % EX GEL: 1.0000 "application " | Freq: Four times a day (QID) | CUTANEOUS | 0 refills | Status: AC | PRN

## 2021-10-01 MED ORDER — ACETAMINOPHEN 500 MG PO TABS: 1000.0000 mg | ORAL_TABLET | Freq: Four times a day (QID) | ORAL | 0 refills | Status: AC | PRN

## 2021-10-01 NOTE — Progress Notes (Signed)
Subjective:    Patient ID: Grant Wilson, male    DOB: Oct 25, 1965, 56 y.o.   MRN: [id number]  56y/o african male established here for evaluation of right lumbar back pain that at times is radiating down right leg.  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  Denied known injury.  Denied swelling/rash/wound/loss of bowel/bladder control, saddle paresthesias or arm/leg weakness.  Received tylenol and motrin from [facility] yesterday along with thermacare and it helped a great deal feeling much better today.  Denied trouble walking/foot drop/numbness legs.  Occasional tingling noted right leg and wondering if B6/B12 low again since he stopped taking vitamin when levels high.  Wants to know if he can restart his vitamin.     Review of Systems  Constitutional:  Negative for activity change, appetite change, chills, diaphoresis, fatigue and fever.  HENT:  Negative for trouble swallowing and voice change.   Eyes:  Negative for photophobia and visual disturbance.  Respiratory:  Negative for cough, shortness of breath, wheezing and stridor.   Cardiovascular:  Negative for leg swelling.  Gastrointestinal:  Negative for diarrhea, nausea and vomiting.  Endocrine: Negative for cold intolerance and heat intolerance.  Genitourinary:  Negative for difficulty urinating and enuresis.  Musculoskeletal:  Positive for back pain and myalgias. Negative for arthralgias, gait problem, joint swelling, neck pain and neck stiffness.  Skin:  Negative for color change, rash and wound.  Allergic/Immunologic: Negative for food allergies.  Neurological:  Negative for dizziness, tremors, seizures, syncope, facial asymmetry, speech difficulty, weakness, light-headedness and headaches.  Hematological:  Negative for adenopathy. Does not bruise/bleed easily.  Psychiatric/Behavioral:  Negative for agitation, confusion and sleep disturbance.       Objective:   Physical Exam Vitals and nursing note  reviewed.  Constitutional:      General: He is awake. He is not in acute distress.    Appearance: Normal appearance. He is well-developed, well-groomed and normal weight. He is not ill-appearing, toxic-appearing or diaphoretic.  HENT:     Head: Normocephalic and atraumatic.     Jaw: There is normal jaw occlusion.     Right Ear: Hearing and external ear normal.     Left Ear: Hearing and external ear normal.     Nose: Nose normal.     Mouth/Throat:     Lips: Pink. No lesions.     Mouth: Mucous membranes are moist.     Tongue: No lesions. Tongue does not deviate from midline.     Palate: No mass and lesions.     Pharynx: Oropharynx is clear. Uvula midline. No oropharyngeal exudate.  Eyes:     General: Lids are normal. Vision grossly intact. Gaze aligned appropriately. No scleral icterus.       Right eye: No discharge.        Left eye: No discharge.     Extraocular Movements: Extraocular movements intact.     Conjunctiva/sclera: Conjunctivae normal.     Pupils: Pupils are equal, round, and reactive to light.  Neck:     Trachea: Trachea and phonation normal. No tracheal deviation.  Cardiovascular:     Rate and Rhythm: Normal rate and regular rhythm.     Pulses:          Radial pulses are 2+ on the right side and 2+ on the left side.     Heart sounds: Normal heart sounds.  Pulmonary:     Effort: Pulmonary effort is normal. No respiratory distress.     Breath  sounds: Normal breath sounds and air entry. No stridor, decreased air movement or transmitted upper airway sounds. No decreased breath sounds or wheezing.     Comments: Respirations even and unlabored; spoke full sentences without difficulty no cough observed Abdominal:     General: There is no distension.     Palpations: Abdomen is soft.     Tenderness: There is no guarding.  Musculoskeletal:        General: Tenderness and signs of injury present. No swelling or deformity.     Right shoulder: Normal.     Left shoulder: Normal.      Right elbow: Normal.     Left elbow: Normal.     Right hand: Normal.     Left hand: Normal.     Cervical back: Normal range of motion and neck supple. No swelling, edema, deformity, erythema, signs of trauma, lacerations, rigidity, spasms, torticollis, tenderness, bony tenderness or crepitus. No pain with movement, spinous process tenderness or muscular tenderness. Normal range of motion.     Thoracic back: No swelling, edema, deformity, signs of trauma, lacerations, spasms, tenderness or bony tenderness. Normal range of motion.     Lumbar back: Spasms and tenderness present. No swelling, edema, deformity, signs of trauma, lacerations or bony tenderness. Decreased range of motion. Negative right straight leg raise test and negative left straight leg raise test. No scoliosis.       Back:     Right hip: Normal.     Left hip: Normal.     Right knee: Normal.     Left knee: Normal.     Right lower leg: No edema.     Left lower leg: No edema.     Right ankle: Normal.     Left ankle: Normal.     Comments: Patient able to touch toes slowly; pain with lateral bending/rotation/extension and forward flexion; on/off exam table without difficulty standing to sitting to supine and reverse without assistance; L1-2 paraspinals TTP and slightly increased tone but no trigger points palpated; SI joints not TTP bilaterally; normal heel toe gait  Lymphadenopathy:     Head:     Right side of head: No submental, submandibular, tonsillar, preauricular, posterior auricular or occipital adenopathy.     Left side of head: No submental, submandibular, tonsillar, preauricular, posterior auricular or occipital adenopathy.     Cervical: No cervical adenopathy.     Right cervical: No superficial, deep or posterior cervical adenopathy.    Left cervical: No superficial, deep or posterior cervical adenopathy.  Skin:    General: Skin is warm and dry.     Capillary Refill: Capillary refill takes less than 2 seconds.      Coloration: Skin is not ashen, cyanotic, jaundiced, mottled, pale or sallow.     Findings: No abrasion, abscess, acne, bruising, burn, ecchymosis, erythema, signs of injury, laceration, lesion, petechiae, rash or wound.     Nails: There is no clubbing.  Neurological:     General: No focal deficit present.     Mental Status: He is alert and oriented to person, place, and time. Mental status is at baseline. He is not disoriented.     GCS: GCS eye subscore is 4. GCS verbal subscore is 5. GCS motor subscore is 6.     Cranial Nerves: Cranial nerves 2-12 are intact. No cranial nerve deficit, dysarthria or facial asymmetry.     Sensory: Sensation is intact. No sensory deficit.     Motor: Motor function is intact. No  weakness, tremor, atrophy, abnormal muscle tone or seizure activity.     Coordination: Coordination is intact. Coordination normal.     Gait: Gait is intact. Gait normal.     Deep Tendon Reflexes: Reflexes normal.     Reflex Scores:      Patellar reflexes are 2+ on the right side and 2+ on the left side.      Achilles reflexes are 2+ on the right side and 2+ on the left side.    Comments: Patient in/out of chair and on/off exam table without difficulty; upper and lower extremity strength equal 5/5  Psychiatric:        Attention and Perception: Attention and perception normal.        Mood and Affect: Mood and affect normal.        Speech: Speech normal.        Behavior: Behavior normal. Behavior is cooperative.        Thought Content: Thought content normal.        Cognition and Memory: Cognition and memory normal.        Judgment: Judgment normal.         Assessment & Plan:   A- acute lumbar pain with radiation down right leg intermittent  P- Ibuprofen 800mg  po TID prn pain  OTC if no relief with tylenol 1000mg  by mouth every 6 hours prn pain OTC.  Ibuprofen can interact with vired so tylenol/biofreeze/thermacare preferred and motrin only if no relief.  Demonstrated hip  stretches, gentle AROM lumbar flexion/extension/lateral bending/rotation, knee to chest and rock side to side on back. Self massage or professional prn, foam roller use or tennis/racquetball.  Heat/cryotherapy 15 minutes QID prn.  Trial thermacare 1 applied and another given to patient for use tomorrow from clinic stock.  Consider physical therapy referral if no improvement with prescribed therapy from [facility] and/or chiropractic care.  Ensure ergonomics correct desk at work avoid repetitive motions if possible/holding phone/laptop in hand use desk/stand and/or break up lifting items into smaller loads/weights.  Patient was instructed to rest, ice, and ROM exercises.  Activity as tolerated.   Follow up if symptoms persist or worsen especially if loss of bowel/bladder control, arm/leg weakness and/or saddle paresthesias same day with a provider.  [facility] handout on lumbar strain and rehab exercises printed and given to patient.  Follow up in 2 weeks for re-evaluation if symptoms have not resolved.  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  See RN Hildred Alamin to schedule if needed.  Patient to bring in vitamin bottle for NP to review or take pictures of label and email to me for review regarding if he should restart multivitamin.  Discussed with patient B6 and B12 levels were high normal last labs.    Patient verbalized agreement and understanding of treatment plan and had no further questions at this time.  P2:  Injury Prevention and Fitness.

## 2021-10-01 NOTE — Patient Instructions (Addendum)
Tylenol 1000mg  by mouth every 6 hours as needed for pain  If no relief with tylenol, heat, biofreeze, ice then may take ibuprofen 800mg  every 8 hours with food.  Ibuprofen can interact with your tenofovir/strain your kidneys especially if you get dehydrated; drink water to keep urine clear yellow tinged and urinating every 4 hours when awake  Radicular Pain Radicular pain is a type of pain that spreads from your back or neck along a spinal nerve. Spinal nerves are nerves that leave the spinal cord and go to the muscles. Radicular pain is sometimes called radiculopathy, radiculitis, or a pinched nerve. When you have this type of pain, you may also have weakness, numbness, or tingling in the area of your body that is supplied by the nerve. The pain may feel sharp and burning. Depending on which spinal nerve is affected, the pain may occur in the: Neck area (cervical radicular pain). You may also feel pain, numbness, weakness, or tingling in the arms. Mid-spine area (thoracic radicular pain). You would feel this pain in the back and chest. This type is rare. Lower back area (lumbar radicular pain). You would feel this pain as low back pain. You may feel pain, numbness, weakness, or tingling in the buttocks or legs. Sciatica is a type of lumbar radicular pain that shoots down the back of the leg. Radicular pain occurs when one of the spinal nerves becomes irritated or squeezed (compressed). It is often caused by something pushing on a spinal nerve, such as one of the bones of the spine (vertebrae) or one of the round cushions between vertebrae (intervertebral disks). This can result from: An injury. Wear and tear or aging of a disk. The growth of a bone spur that pushes on the nerve. Radicular pain often goes away when you follow instructions from your health care provider for relieving pain at home. How is this treated? Treatment may depend on the cause of the condition and may include: Working with a  physical therapist. Taking pain medicine. Applying heat or ice or both to the affected areas. Doing stretches to improve flexibility. Having surgery. This may be needed if other treatments do not help. Different types of surgery may be done depending on the cause of this condition. Follow these instructions at home: Managing pain   If directed, put ice on the affected area. To do this: Put ice in a plastic bag. Place a towel between your skin and the bag. Leave the ice on for 20 minutes, 2-3 times a day. Remove the ice if your skin turns bright red. This is very important. If you cannot feel pain, heat, or cold, you have a greater risk of damage to the area. If directed, apply heat to the affected area as often as told by your health care provider. Use the heat source that your health care provider recommends, such as a moist heat pack or a heating pad. Place a towel between your skin and the heat source. Leave the heat on for 20-30 minutes. Remove the heat if your skin turns bright red. This is especially important if you are unable to feel pain, heat, or cold. You have a greater risk of getting burned. Activity Do not sit or rest in bed for long periods of time. Try to stay as active as possible. Ask your health care provider what type of exercise or activity is best for you. Avoid activities that make your pain worse, such as bending and lifting. You may have to  avoid lifting. Ask your health care provider how much you can safely lift. Practice using proper technique when lifting items. Proper lifting technique involves bending your knees and rising up. Do strength and range-of-motion exercises only as told by your health care provider or physical therapist. General instructions Take over-the-counter and prescription medicines only as told by your health care provider. Pay attention to any changes in your symptoms. Keep all follow-up visits. This is important. Contact a health care  provider if: Your pain and other symptoms get worse. Your pain medicine is not helping. Your pain has not improved after a few weeks of home care. You have a fever. Get help right away if: You have severe pain, weakness, or numbness. You have difficulty with bladder or bowel control. Summary Radicular pain is a type of pain that spreads from your back or neck along a spinal nerve. When you have radicular pain, you may also have weakness, numbness, or tingling in the area of your body that is supplied by the nerve. The pain may feel sharp or burning. Radicular pain may be treated with ice, heat, medicines, or physical therapy. This information is not intended to replace advice given to you by your health care provider. Make sure you discuss any questions you have with your health care provider. Document Revised: 02/07/2021 Document Reviewed: 02/07/2021 Else[employer] Patient Education  [place] [facility] Ask your health care provider which exercises are safe for you. Do exercises exactly as told by your health care provider and adjust them as directed. It is normal to feel mild stretching, pulling, tightness, or discomfort as you do these exercises. Stop right away if you feel sudden pain or your pain gets worse. Do not begin these exercises until told by your health care provider. Stretching and range-of-motion exercises These exercises warm up your muscles and joints and improve the movement and flexibility of your back. These exercises also help to relieve pain, numbness, and tingling. Lumbar rotation  Lie on your back on a firm bed or the floor with your knees bent. Straighten your arms out to your sides so each arm forms a 90-degree angle (right angle) with a side of your body. Slowly move (rotate) both of your knees to one side of your body until you feel a stretch in your lower back (lumbar). Try not to let your shoulders lift off the floor. Hold this  position for ___5-15_______ seconds. Tense your abdominal muscles and slowly move your knees back to the starting position. Repeat this exercise on the other side of your body. Repeat ___3_______ times. Complete this exercise _____2_____ times a day. Single knee to chest  Lie on your back on a firm bed or the floor with both legs straight. Bend one of your knees. Use your hands to move your knee up toward your chest until you feel a gentle stretch in your lower back and buttock. Hold your leg in this position by holding on to the front of your knee. Keep your other leg as straight as possible. Hold this position for ___5-15_______ seconds. Slowly return to the starting position. Repeat with your other leg. Repeat ____3______ times. Complete this exercise ____2______ times a day. Prone extension on elbows  Lie on your abdomen on a firm bed or the floor (prone position). Prop yourself up on your elbows. Use your arms to help lift your chest up until you feel a gentle stretch in your abdomen and your lower back. This  will place some of your body weight on your elbows. If this is uncomfortable, try stacking pillows under your chest. Your hips should stay down, against the surface that you are lying on. Keep your hip and back muscles relaxed. Hold this position for ____5-15______ seconds. Slowly relax your upper body and return to the starting position. Repeat ____3______ times. Complete this exercise ____2______ times a day. Strengthening exercises These exercises build strength and endurance in your back. Endurance is the ability to use your muscles for a long time, even after they get tired. Pelvic tilt This exercise strengthens the muscles that lie deep in the abdomen. Lie on your back on a firm bed or the floor with your legs extended. Bend your knees so they are pointing toward the ceiling and your feet are flat on the floor. Tighten your lower abdominal muscles to press your lower  back against the floor. This motion will tilt your pelvis so your tailbone points up toward the ceiling instead of pointing to your feet or the floor. To help with this exercise, you may place a small towel under your lower back and try to push your back into the towel. Hold this position for ___5-15_______ seconds. Let your muscles relax completely before you repeat this exercise. Repeat ___3_______ times. Complete this exercise ___2_______ times a day. Alternating arm and leg raises  Get on your hands and knees on a firm surface. If you are on a hard floor, you may want to use padding, such as an exercise mat, to cushion your knees. Line up your arms and legs. Your hands should be directly below your shoulders, and your knees should be directly below your hips. Lift your left leg behind you. At the same time, raise your right arm and straighten it in front of you. Do not lift your leg higher than your hip. Do not lift your arm higher than your shoulder. Keep your abdominal and back muscles tight. Keep your hips facing the ground. Do not arch your back. Keep your balance carefully, and do not hold your breath. Hold this position for ____5-15______ seconds. Slowly return to the starting position. Repeat with your right leg and your left arm. Repeat ____3______ times. Complete this exercise ____2______ times a day. Abdominal set with straight leg raise  Lie on your back on a firm bed or the floor. Bend one of your knees and keep your other leg straight. Tense your abdominal muscles and lift your straight leg up, 4-6 inches (10-15 cm) off the ground. Keep your abdominal muscles tight and hold this position for ____5-15______ seconds. Do not hold your breath. Do not arch your back. Keep it flat against the ground. Keep your abdominal muscles tense as you slowly lower your leg back to the starting position. Repeat with your other leg. Repeat ____3______ times. Complete this exercise  _____2_____ times a day. Single leg lower with bent knees Lie on your back on a firm bed or the floor. Tense your abdominal muscles and lift your feet off the floor, one foot at a time, so your knees and hips are bent in 90-degree angles (right angles). Your knees should be over your hips and your lower legs should be parallel to the floor. Keeping your abdominal muscles tense and your knee bent, slowly lower one of your legs so your toe touches the ground. Lift your leg back up to return to the starting position. Do not hold your breath. Do not let your back arch. Keep your  back flat against the ground. Repeat with your other leg. Repeat ____3______ times. Complete this exercise ____2______ times a day. Posture and body mechanics Good posture and healthy body mechanics can help to relieve stress in your body's tissues and joints. Body mechanics refers to the movements and positions of your body while you do your daily activities. Posture is part of body mechanics. Good posture means: Your spine is in its natural S-curve position (neutral). Your shoulders are pulled back slightly. Your head is not tipped forward (neutral). Follow these guidelines to improve your posture and body mechanics in your everyday activities. Standing  When standing, keep your spine neutral and your feet about hip-width apart. Keep a slight bend in your knees. Your ears, shoulders, and hips should line up. When you do a task in which you stand in one place for a long time, place one foot up on a stable object that is 2-4 inches (5-10 cm) high, such as a footstool. This helps keep your spine neutral. Sitting  When sitting, keep your spine neutral and keep your feet flat on the floor. Use a footrest, if necessary, and keep your thighs parallel to the floor. Avoid rounding your shoulders, and avoid tilting your head forward. When working at a desk or a computer, keep your desk at a height where your hands are slightly  lower than your elbows. Slide your chair under your desk so you are close enough to maintain good posture. When working at a computer, place your monitor at a height where you are looking straight ahead and you do not have to tilt your head forward or downward to look at the screen. Resting When lying down and resting, avoid positions that are most painful for you. If you have pain with activities such as sitting, bending, stooping, or squatting, lie in a position in which your body does not bend very much. For example, avoid curling up on your side with your arms and knees near your chest (fetal position). If you have pain with activities such as standing for a long time or reaching with your arms, lie with your spine in a neutral position and bend your knees slightly. Try the following positions: Lying on your side with a pillow between your knees. Lying on your back with a pillow under your knees. Lifting  When lifting objects, keep your feet at least shoulder-width apart and tighten your abdominal muscles. Bend your knees and hips and keep your spine neutral. It is important to lift using the strength of your legs, not your back. Do not lock your knees straight out. Always ask for help to lift heavy or awkward objects. This information is not intended to replace advice given to you by your health care provider. Make sure you discuss any questions you have with your health care provider. Document Revised: 10/22/2020 Document Reviewed: 10/22/2020 Elsevier Patient Education  [place]. Lumbar Strain A lumbar strain, which is sometimes called a low-back strain, is a stretch or tear in a muscle or the strong cords of tissue that attach muscle to bone (tendons) in the lower back (lumbar spine). This type of injury occurs when muscles or tendons are torn or are stretched beyond their limits. Lumbar strains can range from mild to severe. Mild strains may involve stretching a muscle or tendon  without tearing it. These may heal in 1-2 weeks. More severe strains involve tearing of muscle fibers or tendons. These will cause more pain and may take 6-8 weeks  to heal. What are the causes? This condition may be caused by: Trauma, such as a fall or a hit to the body. Twisting or overstretching the back. This may result from doing activities that need a lot of energy, such as lifting heavy objects. What increases the risk? This injury is more common in: Athletes. People with obesity. People who do repeated lifting, bending, or other movements that involve their back. What are the signs or symptoms? Symptoms of this condition may include: Sharp or dull pain in the lower back that does not go away. The pain may extend to the buttocks. Stiffness or limited range of motion. Sudden muscle tightening (spasms). How is this diagnosed? This condition may be diagnosed based on: Your symptoms. Your medical history. A physical exam. Imaging tests, such as: X-rays. MRI. How is this treated? Treatment for this condition may include: Rest. Applying heat and cold to the affected area. Over-the-counter medicines to help relieve pain and inflammation, such as NSAIDs. Prescription pain medicine and muscle relaxants may be needed for a short time. Physical therapy. Follow these instructions at home: Managing pain, stiffness, and swelling   If directed, put ice on the injured area during the first 24 hours after your injury. Put ice in a plastic bag. Place a towel between your skin and the bag. Leave the ice on for 20 minutes, 2-3 times a day. If directed, apply heat to the affected area as often as told by your health care provider. Use the heat source that your health care provider recommends, such as a moist heat pack or a heating pad. Place a towel between your skin and the heat source. Leave the heat on for 20-30 minutes. Remove the heat if your skin turns bright red. This is especially  important if you are unable to feel pain, heat, or cold. You may have a greater risk of getting burned. Activity Rest and return to your normal activities as told by your health care provider. Ask your health care provider what activities are safe for you. Do exercises as told by your health care provider. Medicines Take over-the-counter and prescription medicines only as told by your health care provider. Ask your health care provider if the medicine prescribed to you: Requires you to avoid driving or using heavy machinery. Can cause constipation. You may need to take these actions to prevent or treat constipation: Drink enough fluid to keep your urine pale yellow. Take over-the-counter or prescription medicines. Eat foods that are high in fiber, such as beans, whole grains, and fresh fruits and vegetables. Limit foods that are high in fat and processed sugars, such as fried or sweet foods. Injury prevention To prevent a future low-back injury: Always warm up properly before physical activity or sports. Cool down and stretch after being active. Use correct form when playing sports and lifting heavy objects. Bend your knees before you lift heavy objects. Use good posture when sitting and standing. Stay physically fit and keep a healthy weight. Do at least 150 minutes of moderate-intensity exercise each week, such as brisk walking or water aerobics. Do strength exercises at least 2 times each week.  General instructions Do not use any products that contain nicotine or tobacco, such as cigarettes, e-cigarettes, and chewing tobacco. If you need help quitting, ask your health care provider. Keep all follow-up visits as told by your health care provider. This is important. Contact a health care provider if: Your back pain does not improve after 6 weeks of treatment.  Your symptoms get worse. Get help right away if: Your back pain is severe. You are unable to stand or walk. You develop pain  in your legs. You develop weakness in your buttocks or legs. You have difficulty controlling when you urinate or when you have a bowel movement. You have frequent, painful, or bloody urination. You have a temperature over 101.71F (38.3C) Summary A lumbar strain, which is sometimes called a low-back strain, is a stretch or tear in a muscle or the strong cords of tissue that attach muscle to bone (tendons) in the lower back (lumbar spine). This type of injury occurs when muscles or tendons are torn or are stretched beyond their limits. Rest and return to your normal activities as told by your health care provider. If directed, apply heat and ice to the affected area as often as told by your health care provider. Take over-the-counter and prescription medicines only as told by your health care provider. Contact a health care provider if you have new or worsening symptoms. This information is not intended to replace advice given to you by your health care provider. Make sure you discuss any questions you have with your health care provider. Document Revised: 06/03/2018 Document Reviewed: 06/03/2018 Elsevier Patient Education  2022 [employer].

## 2021-10-16 ENCOUNTER — Ambulatory Visit: Payer: No Typology Code available for payment source | Admitting: Emergency Medicine

## 2021-10-16 ENCOUNTER — Encounter: Payer: Self-pay | Admitting: Emergency Medicine

## 2021-10-16 ENCOUNTER — Other Ambulatory Visit: Payer: Self-pay

## 2021-10-16 VITALS — BP 152/70 | HR 90 | Ht 66.0 in | Wt 150.0 lb

## 2021-10-16 DIAGNOSIS — B181 Chronic viral hepatitis B without delta-agent: Secondary | ICD-10-CM | POA: Diagnosis not present

## 2021-10-16 DIAGNOSIS — K921 Melena: Secondary | ICD-10-CM | POA: Insufficient documentation

## 2021-10-16 DIAGNOSIS — Z7689 Persons encountering health services in other specified circumstances: Secondary | ICD-10-CM | POA: Diagnosis not present

## 2021-10-16 DIAGNOSIS — Z1211 Encounter for screening for malignant neoplasm of colon: Secondary | ICD-10-CM | POA: Insufficient documentation

## 2021-10-16 DIAGNOSIS — R7303 Prediabetes: Secondary | ICD-10-CM

## 2021-10-16 DIAGNOSIS — Z860101 Personal history of adenomatous and serrated colon polyps: Secondary | ICD-10-CM | POA: Insufficient documentation

## 2021-10-16 DIAGNOSIS — B9681 Helicobacter pylori [H. pylori] as the cause of diseases classified elsewhere: Secondary | ICD-10-CM | POA: Insufficient documentation

## 2021-10-16 DIAGNOSIS — A048 Other specified bacterial intestinal infections: Secondary | ICD-10-CM | POA: Insufficient documentation

## 2021-10-16 DIAGNOSIS — I10 Essential (primary) hypertension: Secondary | ICD-10-CM | POA: Diagnosis not present

## 2021-10-16 DIAGNOSIS — Z860100 Personal history of colon polyps, unspecified: Secondary | ICD-10-CM | POA: Insufficient documentation

## 2021-10-16 MED ORDER — LOSARTAN POTASSIUM-HCTZ 50-12.5 MG PO TABS: 1.0000 | ORAL_TABLET | Freq: Every day | ORAL | 3 refills | Status: DC

## 2021-10-16 NOTE — Progress Notes (Signed)
Grant Wilson 56 y.o.   Chief Complaint  Patient presents with   Transitions Of Care    Manage BP, tingling under feet, back pain, x 3 weeks    HISTORY OF PRESENT ILLNESS: This is a 56 y.o. male here to establish care with me. Has history of hypertension presently on hydrochlorothiazide 12.5 mg daily. Gets occasional back pain and tingling under his feet for the past 2 to 3 months. No other complaints or medical concerns today.  HPI   Prior to Admission medications   Medication Sig Start Date End Date Taking? Authorizing Provider  hydrochlorothiazide (MICROZIDE) 12.5 MG capsule Take 2 capsules (25 mg total) by mouth daily for 15 days. 09/17/21 10/16/21 Yes Betancourt, Aura Fey, NP  tenofovir (VIREAD) 300 MG tablet Take 300 mg by mouth daily. Reported on 01/11/2016   Yes [provider]    No Known Allergies  Patient Active Problem List   Diagnosis Date Noted   Helicobacter pylori (H. 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[place]) 10/16/2021   Personal history of colonic polyps 10/16/2021   Primary hypertension 10/16/2021   Screening for malignant neoplasm of colon 10/16/2021   Vitamin D deficiency 08/24/2020   Pain in right hand 08/24/2020   Positive ANA (antinuclear antibody) 08/24/2020   Right wrist pain 11/15/2018   Elevated LDH 11/15/2018   Prediabetes 10/24/2017   Epigastric pain 11/08/2016   Gastroesophageal reflux disease without esophagitis 11/08/2016   DDD (degenerative disc disease), lumbar 01/11/2016   Chronic low back pain 07/22/2012   High cholesterol 05/12/2012   Seasonal allergies 05/12/2012   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/2011    Past Medical History:  Diagnosis Date   Allergy    GERD (gastroesophageal reflux disease)    Hepatitis B    Hyperlipidemia    Prediabetes     Past Surgical History:  Procedure Laterality Date   NO PAST SURGERIES      Social  History   Socioeconomic History   Marital status: Single    Spouse name: n/a   Number of children: 0   Years of education: associates   Highest education level: Not on file  Occupational History   Occupation: IT department    Comment: [employer]  Tobacco Use   Smoking status: Never   Smokeless tobacco: Never  Vaping Use   Vaping Use: Never used  Substance and Sexual Activity   Alcohol use: No    Alcohol/week: 0.0 standard drinks   Drug use: No   Sexual activity: Yes    Partners: Female    Birth control/protection: Condom  Other Topics Concern   Not on file  Social History Narrative   From [place]. Came to the [place] in 1997.   Lives alone.   No children   Social Determinants of Radio broadcast assistant Strain: Not on file  Food Insecurity: Not on file  Transportation Needs: Not on file  Physical Activity: Not on file  Stress: Not on file  Social Connections: Not on file  Intimate Partner Violence: Not on file    No family history on file.   Review of Systems  Constitutional: Negative.  Negative for chills and fever.  HENT: Negative.  Negative for congestion and sore throat.   Respiratory: Negative.  Negative for cough and shortness of breath.   Cardiovascular: Negative.  Negative for chest pain and palpitations.  Gastrointestinal:  Negative for abdominal pain,  diarrhea, nausea and vomiting.  Genitourinary: Negative.   Skin: Negative.  Negative for rash.  Neurological:  Negative for dizziness and headaches.  All other systems reviewed and are negative.  Today's Vitals   10/16/21 1552  BP: (!) 152/70  Pulse: 90  SpO2: 96%  Weight: 150 lb (68 kg)  Height: 5\' 6"  (1.676 m)   Body mass index is 24.21 kg/m.  Physical Exam Vitals reviewed.  Constitutional:      Appearance: Normal appearance.  HENT:     Head: Normocephalic.  Eyes:     Extraocular Movements: Extraocular movements intact.     Conjunctiva/sclera: Conjunctivae normal.     Pupils: Pupils  are equal, round, and reactive to light.  Cardiovascular:     Rate and Rhythm: Normal rate and regular rhythm.     Pulses: Normal pulses.     Heart sounds: Normal heart sounds.  Pulmonary:     Effort: Pulmonary effort is normal.     Breath sounds: Normal breath sounds.  Abdominal:     Palpations: Abdomen is soft.     Tenderness: There is no abdominal tenderness.  Musculoskeletal:     Cervical back: No tenderness.  Lymphadenopathy:     Cervical: No cervical adenopathy.  Skin:    General: Skin is warm and dry.     Capillary Refill: Capillary refill takes less than 2 seconds.  Neurological:     General: No focal deficit present.     Mental Status: He is alert and oriented to person, place, and time.  Psychiatric:        Mood and Affect: Mood normal.        Behavior: Behavior normal.     ASSESSMENT & PLAN: A total of 45 minutes was spent with the patient and counseling/coordination of care regarding preparing for this visit, establishing care with me, review of most recent office visit notes, review of most recent blood work results, review of all medications and changes made, cardiovascular risks associated with uncontrolled hypertension, education on nutrition, prognosis, documentation, need for follow-up in 3 months.  Problem List Items Addressed This Visit       Cardiovascular and Mediastinum   Primary hypertension    Uncontrolled hypertension.  We will stop hydrochlorothiazide and start losartan-HCTZ 50-12.5 mg daily.  Advised to monitor blood pressure readings at home daily for the next several weeks and keep a log.  Dietary approaches to stop hypertension discussed. Follow-up in 3 months.      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  Takes tenofovir 300 mg daily and sees ID doctor on a regular basis.        Other   Prediabetes    Diet and nutrition discussed.   Advised to decrease amount of daily carbohydrate intake.      Other Visit Diagnoses     Essential hypertension    -  Primary   Relevant Medications   losartan-hydrochlorothiazide (HYZAAR) 50-12.5 MG tablet   Encounter to establish care          Patient Instructions  Hypertension, Adult High blood pressure (hypertension) is when the force of blood pumping through the arteries is too strong. The arteries are the blood vessels that carry blood from the heart throughout the body. Hypertension forces the heart to work harder to pump blood and may cause arteries to become narrow or stiff. Untreated or uncontrolled  hypertension can cause a heart attack, heart failure, a stroke, kidney disease, and other problems. A blood pressure reading consists of a higher number over a lower number. Ideally, your blood pressure should be below 120/80. The first ("top") number is called the systolic pressure. It is a measure of the pressure in your arteries as your heart beats. The second ("bottom") number is called the diastolic pressure. It is a measure of the pressure in your arteries as the heart relaxes. What are the causes? The exact cause of this condition is not known. There are some conditions that result in or are related to high blood pressure. What increases the risk? Some risk factors for high blood pressure are under your control. The following factors may make you more likely to develop this condition: Smoking. Having type 2 diabetes mellitus, high cholesterol, or both. Not getting enough exercise or physical activity. Being overweight. Having too much fat, sugar, calories, or salt (sodium) in your diet. Drinking too much alcohol. Some risk factors for high blood pressure may be difficult or impossible to change. Some of these factors include: Having chronic kidney disease. Having a family history of high blood pressure. Age. Risk increases with age. Race. You may be at higher risk if you are  African American. Gender. Men are at higher risk than women before age 84. After age 16, women are at higher risk than men. Having obstructive sleep apnea. Stress. What are the signs or symptoms? High blood pressure may not cause symptoms. Very high blood pressure (hypertensive crisis) may cause: Headache. Anxiety. Shortness of breath. Nosebleed. Nausea and vomiting. Vision changes. Severe chest pain. Seizures. How is this diagnosed? This condition is diagnosed by measuring your blood pressure while you are seated, with your arm resting on a flat surface, your legs uncrossed, and your feet flat on the floor. The cuff of the blood pressure monitor will be placed directly against the skin of your upper arm at the level of your heart. It should be measured at least twice using the same arm. Certain conditions can cause a difference in blood pressure between your right and left arms. Certain factors can cause blood pressure readings to be lower or higher than normal for a short period of time: When your blood pressure is higher when you are in a health care provider's office than when you are at home, this is called white coat hypertension. Most people with this condition do not need medicines. When your blood pressure is higher at home than when you are in a health care provider's office, this is called masked hypertension. Most people with this condition may need medicines to control blood pressure. If you have a high blood pressure reading during one visit or you have normal blood pressure with other risk factors, you may be asked to: Return on a different day to have your blood pressure checked again. Monitor your blood pressure at home for 1 week or longer. If you are diagnosed with hypertension, you may have other blood or imaging tests to help your health care provider understand your overall risk for other conditions. How is this treated? This condition is treated by making healthy  lifestyle changes, such as eating healthy foods, exercising more, and reducing your alcohol intake. Your health care provider may prescribe medicine if lifestyle changes are not enough to get your blood pressure under control, and if: Your systolic blood pressure is above 130. Your diastolic blood pressure is above 80. Your personal target blood  pressure may vary depending on your medical conditions, your age, and other factors. Follow these instructions at home: Eating and drinking  Eat a diet that is high in fiber and potassium, and low in sodium, added sugar, and fat. An example eating plan is called the DASH (Dietary Approaches to Stop Hypertension) diet. To eat this way: Eat plenty of fresh fruits and vegetables. Try to fill one half of your plate at each meal with fruits and vegetables. Eat whole grains, such as whole-wheat pasta, brown rice, or whole-grain bread. Fill about one fourth of your plate with whole grains. Eat or drink low-fat dairy products, such as skim milk or low-fat yogurt. Avoid fatty cuts of meat, processed or cured meats, and poultry with skin. Fill about one fourth of your plate with lean proteins, such as fish, chicken without skin, beans, eggs, or tofu. Avoid pre-made and processed foods. These tend to be higher in sodium, added sugar, and fat. Reduce your daily sodium intake. Most people with hypertension should eat less than 1,500 mg of sodium a day. Do not drink alcohol if: Your health care provider tells you not to drink. You are pregnant, may be pregnant, or are planning to become pregnant. If you drink alcohol: Limit how much you use to: 0-1 drink a day for women. 0-2 drinks a day for men. Be aware of how much alcohol is in your drink. In the [place] one drink equals one 12 oz bottle of beer (355 mL), one 5 oz glass of wine (148 mL), or one 1 oz glass of hard liquor (44 mL). Lifestyle  Work with your health care provider to maintain a healthy body weight or  to lose weight. Ask what an ideal weight is for you. Get at least 30 minutes of exercise most days of the week. Activities may include walking, swimming, or biking. Include exercise to strengthen your muscles (resistance exercise), such as Pilates or lifting weights, as part of your weekly exercise routine. Try to do these types of exercises for 30 minutes at least 3 days a week. Do not use any products that contain nicotine or tobacco, such as cigarettes, e-cigarettes, and chewing tobacco. If you need help quitting, ask your health care provider. Monitor your blood pressure at home as told by your health care provider. Keep all follow-up visits as told by your health care provider. This is important. Medicines Take over-the-counter and prescription medicines only as told by your health care provider. Follow directions carefully. Blood pressure medicines must be taken as prescribed. Do not skip doses of blood pressure medicine. Doing this puts you at risk for problems and can make the medicine less effective. Ask your health care provider about side effects or reactions to medicines that you should watch for. Contact a health care provider if you: Think you are having a reaction to a medicine you are taking. Have headaches that keep coming back (recurring). Feel dizzy. Have swelling in your ankles. Have trouble with your vision. Get help right away if you: Develop a severe headache or confusion. Have unusual weakness or numbness. Feel faint. Have severe pain in your chest or abdomen. Vomit repeatedly. Have trouble breathing. Summary Hypertension is when the force of blood pumping through your arteries is too strong. If this condition is not controlled, it may put you at risk for serious complications. Your personal target blood pressure may vary depending on your medical conditions, your age, and other factors. For most people, a normal blood pressure is  less than 120/80. Hypertension is  treated with lifestyle changes, medicines, or a combination of both. Lifestyle changes include losing weight, eating a healthy, low-sodium diet, exercising more, and limiting alcohol. This information is not intended to replace advice given to you by your health care provider. Make sure you discuss any questions you have with your health care provider. Document Revised: 04/14/2018 Document Reviewed: 04/14/2018 Elsevier Patient Education  2022 [place], MD [facility]

## 2021-10-16 NOTE — Assessment & Plan Note (Signed)
Uncontrolled hypertension.  We will stop hydrochlorothiazide and start losartan-HCTZ 50-12.5 mg daily.  Advised to monitor blood pressure readings at home daily for the next several weeks and keep a log.  Dietary approaches to stop hypertension discussed. ?Follow-up in 3 months. ?

## 2021-10-16 NOTE — Patient Instructions (Signed)

## 2021-10-16 NOTE — Assessment & Plan Note (Signed)
Diet and nutrition discussed.  Advised to decrease amount of daily carbohydrate intake. 

## 2021-10-16 NOTE — Assessment & Plan Note (Signed)
Stable.  Takes tenofovir 300 mg daily and sees ID doctor on a regular basis. ?

## 2021-11-05 ENCOUNTER — Telehealth: Payer: Self-pay | Admitting: Registered Nurse

## 2021-11-05 ENCOUNTER — Encounter: Payer: Self-pay | Admitting: Registered Nurse

## 2021-11-05 DIAGNOSIS — I10 Essential (primary) hypertension: Secondary | ICD-10-CM

## 2021-11-05 NOTE — Telephone Encounter (Signed)
Patient on hydrochlorothiazide '25mg'$  po daily requested refill from [facility] formulary at work today.  Last filled 09/17/21 per paper chart review.   Noted on epic review patient saw PCM beginning of march and medication changed to losartan hydrochlorothiazide.  Patient reported he did not pick up medication from pharmacy.  Will check blood pressure today as elevated at [facility] visit along with weight check. Patient to see RN Hildred Alamin for BP check prior to her leaving today at 1230. Patient reported feeling well denied concerns at this time.  Gait sure and steady skin warm dry and pink. A&Ox3 spoke full sentences without difficulty.  RN Hildred Alamin notified.  Patient verbalized understanding information/instructions, agreed with plan of care and had no further questions at this time. ?

## 2021-11-11 ENCOUNTER — Ambulatory Visit: Payer: Self-pay | Admitting: *Deleted

## 2021-11-11 VITALS — BP 165/73

## 2021-11-11 DIAGNOSIS — I10 Essential (primary) hypertension: Secondary | ICD-10-CM

## 2021-11-11 NOTE — Progress Notes (Signed)
Pt in to clinic for bp check. Reports he has been taking only HCTZ from clinic/[facility] dispense and just picked up and started Losartan/HCTZ on Thurs 3/23. He also took med just prior to clinic arrival today due to schedule change. He confirms he is not to take plain HCTZ with Losartan/HCTZ to avoid duplicate dosing. He denies any side effects. Will RTW for bp recheck about 2 weeks after start date.  ?

## 2021-11-13 NOTE — Telephone Encounter (Signed)
Pt in to clinic for bp check with RN [facility] 11/11/21 165/73.  Patient reported he has been taking only hydrochlorothiazide from clinic/[facility] dispense and just picked up and started Losartan/hydrochlorothiazide on Thurs 3/23. He also took med just prior to clinic arrival today due to schedule change. He confirms he is not to take plain hydrochlorothiazide  with Losartan/hydrochlorothiazide  to avoid duplicate dosing. He denied any side effects. Will RTW for bp recheck about 2 weeks for repeat BP check with RN Hildred Alamin.  ?

## 2021-12-12 NOTE — Telephone Encounter (Signed)
RN Hildred Alamin contacted patient for follow up BP recheck.  Patient stated he would come to clinic this afternoon ?

## 2022-01-14 ENCOUNTER — Ambulatory Visit: Payer: Self-pay | Admitting: *Deleted

## 2022-01-14 VITALS — BP 160/86 | HR 97 | Ht 66.5 in | Wt 150.0 lb

## 2022-01-14 DIAGNOSIS — I10 Essential (primary) hypertension: Secondary | ICD-10-CM

## 2022-01-14 NOTE — Progress Notes (Signed)
BP check. Not taking BP med regularly. Took this morning about 2-3 hours PTA.

## 2022-01-16 ENCOUNTER — Encounter: Payer: Self-pay | Admitting: Emergency Medicine

## 2022-01-16 ENCOUNTER — Ambulatory Visit: Payer: No Typology Code available for payment source | Admitting: Emergency Medicine

## 2022-01-16 VITALS — BP 132/80 | HR 75 | Temp 98.2°F | Ht 66.5 in | Wt 157.2 lb

## 2022-01-16 DIAGNOSIS — I10 Essential (primary) hypertension: Secondary | ICD-10-CM

## 2022-01-16 DIAGNOSIS — K649 Unspecified hemorrhoids: Secondary | ICD-10-CM

## 2022-01-16 DIAGNOSIS — K648 Other hemorrhoids: Secondary | ICD-10-CM | POA: Insufficient documentation

## 2022-01-16 DIAGNOSIS — K219 Gastro-esophageal reflux disease without esophagitis: Secondary | ICD-10-CM

## 2022-01-16 MED ORDER — HYDROCORTISONE ACETATE 30 MG RE SUPP: 1.0000 | Freq: Two times a day (BID) | RECTAL | 2 refills | Status: DC | PRN

## 2022-01-16 MED ORDER — HYDROCORTISONE (PERIANAL) 2.5 % EX CREA: 1.0000 "application " | TOPICAL_CREAM | Freq: Two times a day (BID) | CUTANEOUS | 0 refills | Status: DC

## 2022-01-16 NOTE — Assessment & Plan Note (Signed)
Well-controlled hypertension. BP Readings from Last 3 Encounters:  01/16/22 132/80  01/14/22 (!) 160/86  11/11/21 (!) 165/73  Continue Hyzaar 50-12.5 mg daily

## 2022-01-16 NOTE — Assessment & Plan Note (Signed)
Stable and well controlled. 

## 2022-01-16 NOTE — Patient Instructions (Signed)
Hemorrhoids Hemorrhoids are swollen veins that may develop: In the butt (rectum). These are called internal hemorrhoids. Around the opening of the butt (anus). These are called external hemorrhoids. Hemorrhoids can cause pain, itching, or bleeding. Most of the time, they do not cause serious problems. They usually get better with diet changes, lifestyle changes, and other home treatments. What are the causes? This condition may be caused by: Having trouble pooping (constipation). Pushing hard (straining) to poop. Watery poop (diarrhea). Pregnancy. Being very overweight (obese). Sitting for long periods of time. Heavy lifting or other activity that causes you to strain. Anal sex. Riding a bike for a long period of time. What are the signs or symptoms? Symptoms of this condition include: Pain. Itching or soreness in the butt. Bleeding from the butt. Leaking poop. Swelling in the area. One or more lumps around the opening of your butt. How is this diagnosed? A doctor can often diagnose this condition by looking at the affected area. The doctor may also: Do an exam that involves feeling the area with a gloved hand (digital rectal exam). Examine the area inside your butt using a small tube (anoscope). Order blood tests. This may be done if you have lost a lot of blood. Have you get a test that involves looking inside the colon using a flexible tube with a camera on the end (sigmoidoscopy or colonoscopy). How is this treated? This condition can usually be treated at home. Your doctor may tell you to change what you eat, make lifestyle changes, or try home treatments. If these do not help, procedures can be done to remove the hemorrhoids or make them smaller. These may involve: Placing rubber bands at the base of the hemorrhoids to cut off their blood supply. Injecting medicine into the hemorrhoids to shrink them. Shining a type of light energy onto the hemorrhoids to cause them to fall  off. Doing surgery to remove the hemorrhoids or cut off their blood supply. Follow these instructions at home: Eating and drinking  Eat foods that have a lot of fiber in them. These include whole grains, beans, nuts, fruits, and vegetables. Ask your doctor about taking products that have added fiber (fibersupplements). Reduce the amount of fat in your diet. You can do this by: Eating low-fat dairy products. Eating less red meat. Avoiding processed foods. Drink enough fluid to keep your pee (urine) pale yellow. Managing pain and swelling  Take a warm-water bath (sitz bath) for 20 minutes to ease pain. Do this 3-4 times a day. You may do this in a bathtub or using a portable sitz bath that fits over the toilet. If told, put ice on the painful area. It may be helpful to use ice between your warm baths. Put ice in a plastic bag. Place a towel between your skin and the bag. Leave the ice on for 20 minutes, 2-3 times a day. General instructions Take over-the-counter and prescription medicines only as told by your doctor. Medicated creams and medicines may be used as told. Exercise often. Ask your doctor how much and what kind of exercise is best for you. Go to the bathroom when you have the urge to poop. Do not wait. Avoid pushing too hard when you poop. Keep your butt dry and clean. Use wet toilet paper or moist towelettes after pooping. Do not sit on the toilet for a long time. Keep all follow-up visits as told by your doctor. This is important. Contact a doctor if you: Have pain and   swelling that do not get better with treatment or medicine. Have trouble pooping. Cannot poop. Have pain or swelling outside the area of the hemorrhoids. Get help right away if you have: Bleeding that will not stop. Summary Hemorrhoids are swollen veins in the butt or around the opening of the butt. They can cause pain, itching, or bleeding. Eat foods that have a lot of fiber in them. These include  whole grains, beans, nuts, fruits, and vegetables. Take a warm-water bath (sitz bath) for 20 minutes to ease pain. Do this 3-4 times a day. This information is not intended to replace advice given to you by your health care provider. Make sure you discuss any questions you have with your health care provider. Document Revised: 02/13/2021 Document Reviewed: 02/13/2021 Elsevier Patient Education  2023 [employer] Inc[employer]  

## 2022-01-16 NOTE — Assessment & Plan Note (Signed)
Hemorrhoid instructions given Will benefit from suppositories and external cream We will use hydrocortisone cream and hydrocortisone suppositories No bleeding or thrombotic complications

## 2022-01-16 NOTE — Progress Notes (Signed)
Grant Wilson 56 y.o.   Chief Complaint  Patient presents with   Follow-up   burning sensation    Burning sensation after bowel movement     HISTORY OF PRESENT ILLNESS: This is a 56 y.o. male with history of hemorrhoids complaining of burning sensation after bowel movement.  Denies bleeding.  Preparation H sometimes helps.  No other associated symptoms.  No other complaints or medical concerns today.  HPI   Prior to Admission medications   Medication Sig Start Date End Date Taking? Authorizing Provider  losartan-hydrochlorothiazide (HYZAAR) 50-12.5 MG tablet Take 1 tablet by mouth daily. 10/16/21  Yes Joe Gee, Ines Bloomer, MD  tenofovir (VIREAD) 300 MG tablet Take 300 mg by mouth daily. Reported on 01/11/2016   Yes [provider]    No Known Allergies  Patient Active Problem List   Diagnosis Date Noted   Helicobacter pylori (H. 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[place]) 09/22/2011    Past Medical History:  Diagnosis Date   Allergy    GERD (gastroesophageal reflux disease)    Hepatitis B    Hyperlipidemia    Prediabetes     Past Surgical History:  Procedure Laterality Date   NO PAST SURGERIES      Social History   Socioeconomic History   Marital status: Single    Spouse name: n/a   Number of children: 0   Years of education: associates   Highest education level: Not on file  Occupational History   Occupation: IT department    Comment: [employer]   Tobacco Use   Smoking status: Never   Smokeless tobacco: Never  Vaping Use   Vaping Use: Never used  Substance and Sexual Activity   Alcohol use: No    Alcohol/week: 0.0 standard drinks   Drug use: No   Sexual activity: Yes    Partners: Female    Birth control/protection: Condom  Other Topics Concern   Not on file  Social History Narrative   From [place]. Came to the [place] in 1997.   Lives alone.   No children   Social Determinants of Radio broadcast assistant Strain: Not on file  Food Insecurity: Not on file  Transportation Needs: Not on file  Physical Activity: Not on file  Stress: Not on file  Social Connections: Not on file  Intimate Partner Violence: Not on file    No family history on file.   Review of Systems  Constitutional: Negative.  Negative for chills and fever.  HENT: Negative.    Respiratory: Negative.    Cardiovascular: Negative.   Gastrointestinal:  Negative for abdominal pain, blood in stool, diarrhea, melena, nausea and vomiting.  Genitourinary: Negative.   Skin: Negative.  Negative for rash.  Neurological:  Negative for dizziness and headaches.  All other systems reviewed and are negative.  Today's Vitals   01/16/22 1536  BP: 132/80  Pulse: 75  Temp: 98.2 F (36.8 C)  TempSrc: Oral  SpO2: 98%  Weight: 157 lb 4 oz (71.3 kg)  Height:  5' 6.5" (1.689 m)   Body mass index is 25 kg/m.  Physical Exam Vitals reviewed.  Constitutional:      Appearance: Normal appearance.  HENT:     Head: Normocephalic.  Eyes:     Extraocular Movements: Extraocular movements intact.     Pupils: Pupils are equal, round, and reactive to light.  Cardiovascular:     Rate and Rhythm: Normal rate and regular rhythm.     Pulses: Normal pulses.     Heart sounds: Normal heart sounds.  Pulmonary:     Effort: Pulmonary effort is normal.     Breath sounds: Normal breath sounds.  Abdominal:     Palpations: Abdomen is soft.     Tenderness: There is no abdominal  tenderness.  Genitourinary:    Rectum: External hemorrhoid present.  Musculoskeletal:     Cervical back: No tenderness.  Lymphadenopathy:     Cervical: No cervical adenopathy.  Skin:    Capillary Refill: Capillary refill takes less than 2 seconds.  Neurological:     General: No focal deficit present.     Mental Status: He is alert and oriented to person, place, and time.  Psychiatric:        Mood and Affect: Mood normal.        Behavior: Behavior normal.     ASSESSMENT & PLAN: Problem List Items Addressed This Visit       Cardiovascular and Mediastinum   Primary hypertension    Well-controlled hypertension. BP Readings from Last 3 Encounters:  01/16/22 132/80  01/14/22 (!) 160/86  11/11/21 (!) 165/73  Continue Hyzaar 50-12.5 mg daily       Hemorrhoids - Primary    Hemorrhoid instructions given Will benefit from suppositories and external cream We will use hydrocortisone cream and hydrocortisone suppositories No bleeding or thrombotic complications       Relevant Medications   HYDROCORTISONE ACE, RECTAL, (PROCTOCORT) 30 MG SUPP   hydrocortisone (ANUSOL-HC) 2.5 % rectal cream     Digestive   Gastroesophageal reflux disease without esophagitis    Stable and well-controlled.       Patient Instructions  Hemorrhoids Hemorrhoids are swollen veins that may develop: In the butt (rectum). These are called internal hemorrhoids. Around the opening of the butt (anus). These are called external hemorrhoids. Hemorrhoids can cause pain, itching, or bleeding. Most of the time, they do not cause serious problems. They usually get better with diet changes, lifestyle changes, and other home treatments. What are the causes? This condition may be caused by: Having trouble pooping (constipation). Pushing hard (straining) to poop. Watery poop (diarrhea). Pregnancy. Being very overweight (obese). Sitting for long periods of time. Heavy lifting or other activity that causes  you to strain. Anal sex. Riding a bike for a long period of time. What are the signs or symptoms? Symptoms of this condition include: Pain. Itching or soreness in the butt. Bleeding from the butt. Leaking poop. Swelling in the area. One or more lumps around the opening of your butt. How is this diagnosed? A doctor can often diagnose this condition by looking at the affected area. The doctor may also: Do an exam that involves feeling the area with a gloved hand (digital rectal exam). Examine the area inside your butt using a small tube (anoscope). Order blood tests. This may be done if you have lost a lot of blood. Have you get a test that involves looking inside the colon using a flexible tube with a camera on the end (  sigmoidoscopy or colonoscopy). How is this treated? This condition can usually be treated at home. Your doctor may tell you to change what you eat, make lifestyle changes, or try home treatments. If these do not help, procedures can be done to remove the hemorrhoids or make them smaller. These may involve: Placing rubber bands at the base of the hemorrhoids to cut off their blood supply. Injecting medicine into the hemorrhoids to shrink them. Shining a type of light energy onto the hemorrhoids to cause them to fall off. Doing surgery to remove the hemorrhoids or cut off their blood supply. Follow these instructions at home: Eating and drinking  Eat foods that have a lot of fiber in them. These include whole grains, beans, nuts, fruits, and vegetables. Ask your doctor about taking products that have added fiber (fibersupplements). Reduce the amount of fat in your diet. You can do this by: Eating low-fat dairy products. Eating less red meat. Avoiding processed foods. Drink enough fluid to keep your pee (urine) pale yellow. Managing pain and swelling  Take a warm-water bath (sitz bath) for 20 minutes to ease pain. Do this 3-4 times a day. You may do this in a bathtub or  using a portable sitz bath that fits over the toilet. If told, put ice on the painful area. It may be helpful to use ice between your warm baths. Put ice in a plastic bag. Place a towel between your skin and the bag. Leave the ice on for 20 minutes, 2-3 times a day. General instructions Take over-the-counter and prescription medicines only as told by your doctor. Medicated creams and medicines may be used as told. Exercise often. Ask your doctor how much and what kind of exercise is best for you. Go to the bathroom when you have the urge to poop. Do not wait. Avoid pushing too hard when you poop. Keep your butt dry and clean. Use wet toilet paper or moist towelettes after pooping. Do not sit on the toilet for a long time. Keep all follow-up visits as told by your doctor. This is important. Contact a doctor if you: Have pain and swelling that do not get better with treatment or medicine. Have trouble pooping. Cannot poop. Have pain or swelling outside the area of the hemorrhoids. Get help right away if you have: Bleeding that will not stop. Summary Hemorrhoids are swollen veins in the butt or around the opening of the butt. They can cause pain, itching, or bleeding. Eat foods that have a lot of fiber in them. These include whole grains, beans, nuts, fruits, and vegetables. Take a warm-water bath (sitz bath) for 20 minutes to ease pain. Do this 3-4 times a day. This information is not intended to replace advice given to you by your health care provider. Make sure you discuss any questions you have with your health care provider. Document Revised: 02/13/2021 Document Reviewed: 02/13/2021 Elsevier Patient Education  [place], MD [facility]

## 2022-01-28 ENCOUNTER — Ambulatory Visit: Payer: Self-pay | Admitting: *Deleted

## 2022-01-28 VITALS — BP 155/85 | HR 101

## 2022-01-28 DIAGNOSIS — I10 Essential (primary) hypertension: Secondary | ICD-10-CM

## 2022-01-28 NOTE — Progress Notes (Signed)
Stopped losartan-HCTZ about 5 days ago as he developed blurry vision and thought it was related to the medication. Vision has improved now.   Per Clinical Key: Losartan; hydrochlorothiazide may result in ophthalmic and otic adverse reactions. Hydrochlorothiazide, a sulfonamide, can cause an idiosyncratic reaction, resulting in transient myopia and acute angle-closure glaucoma. Symptoms include acute onset of decreased visual acuity or ocular pain and typically occur within hours to weeks of drug initiation. Untreated acute angle-closure glaucoma can lead to permanent vision loss. Discontinue hydrochlorothiazide as rapidly as possible and consider prompt medical or surgical treatment if the ocular hypertension remains uncontrolled.[id number] Transient blurred vision and xanthopsia have also been reported during hydrochlorothiazide monotherapy. Blurred vision, ocular irritation (i.e., burning/stinging in the eye), conjunctivitis, visual impairment (i.e., decrease in visual acuity), and tinnitus have been reported with losartan.[id number]   Pt advised to contact his MD office and notify them that he stopped med due to side effect and he is going to check BP at home and work. Home BP staying under 140s/90s. Advised by NP Otila Kluver that if BP consistently stays above 140/80, to contact MD office about trying different medication. Pt agreeable.   Pt also asked about lower price for hydrocortisone cream and suppository Rxs. Given GoodRx coupon and advised [facility] was about $36 for another pt that filled same med. Pt verbalizes understanding. Denies further questions or concerns.

## 2022-01-31 ENCOUNTER — Telehealth: Payer: Self-pay | Admitting: *Deleted

## 2022-01-31 NOTE — Telephone Encounter (Signed)
Thanks

## 2022-01-31 NOTE — Telephone Encounter (Signed)
Pt in to clinic reporting that GoodRx coupon for hydrocortisone suppositories was not eligible at [employer]. RN called [facility] and new coupon info given but price $150. Called Rx in to Kristopher Oppenheim at [place] under original prescriber info, Dr. Mitchel Honour, and gave coupon info over phone and price confirmed at $38. Pt made aware and will pick up this afternoon.

## 2022-02-20 ENCOUNTER — Encounter: Payer: Self-pay | Admitting: Registered Nurse

## 2022-02-20 ENCOUNTER — Ambulatory Visit: Payer: No Typology Code available for payment source | Admitting: Registered Nurse

## 2022-02-20 VITALS — BP 150/84 | HR 70 | Resp 16

## 2022-02-20 DIAGNOSIS — I10 Essential (primary) hypertension: Secondary | ICD-10-CM

## 2022-02-20 DIAGNOSIS — S46912A Strain of unspecified muscle, fascia and tendon at shoulder and upper arm level, left arm, initial encounter: Secondary | ICD-10-CM

## 2022-02-20 MED ORDER — BIOFREEZE 4 % EX GEL: 1.0000 | Freq: Four times a day (QID) | CUTANEOUS | 0 refills | Status: AC | PRN

## 2022-02-20 MED ORDER — ACETAMINOPHEN 500 MG PO TABS: 1000.0000 mg | ORAL_TABLET | Freq: Four times a day (QID) | ORAL | 0 refills | Status: AC | PRN

## 2022-02-20 NOTE — Patient Instructions (Signed)
Tennis Elbow and Golfer's Elbow, Adult You will learn what movements or activities may cause inflammation of the elbow and how a specialist may diagnose the condition. To view the content, go to this web address: [email or web address]  This video will expire on: 11/05/2023. If you need access to this video following this date, please reach out to the healthcare provider who assigned it to you. This information is not intended to replace advice given to you by your health care provider. Make sure you discuss any questions you have with your health care provider. Elsevier Patient Education  [place] An elbow sprain is an injury to one of the strong bands of tissue (ligaments) that connect the bones of the elbow. Ligaments connect the three bones that make up the elbow joint. This injury usually occurs on the inside of the elbow and rarely on the outside. There are three types of elbow sprains: A grade 1 sprain is a stretching of a ligament. This injury causes minor pain and swelling, but the joint remains stable. A grade 2 sprain is a partial ligament tear. This injury may cause moderate pain and swelling, and a looseness in the joint that causes it to move more than normal (laxity). A grade 3 sprain is a complete ligament tear. This injury will cause severe pain and swelling, and the joint will become unstable. What are the causes? This condition may be caused by: A sudden injury (acute sprain). This may result from falling on an outstretched arm or severely twisting or straining your elbow joint. An overuse injury. This injury occurs gradually over time from doing activities that involve the same elbow movements over and over again. This type of injury is common in activities that require overhand throwing. What are the signs or symptoms? Symptoms of this condition include: Pain. Pain that gets worse with elbow movement. Swelling. Bruising. Stiffness of the  elbow joint. Laxity of the elbow joint. Inability to use the elbow joint. Tingling or numbness. Hearing a pop or feeling a tear at the time of the injury. How is this diagnosed? This condition may be diagnosed based on: Your symptoms and history of an injury or activity that puts stress on the elbow. A physical exam. The health care provider will check the movement and stability of your elbow. Imaging tests, such as an X-ray or MRI. These tests can show how severe the sprain is and can be used to rule out a broken bone or stress fracture. How is this treated? At first, this condition may be treated by protecting, resting, icing, applying pressure (compression), and raising (elevating) the injured elbow above the level of your heart. This is known as PRICE therapy. Additional treatment depends on the severity of the sprain. A grade 1 sprain may need only PRICE therapy. A grade 2 sprain may be treated with PRICE therapy and an elbow brace. A grade 3 sprain usually requires surgery to repair the ligament. Your health care provider may also recommend taking a nonsteroidal anti-inflammatory drug (NSAID) to reduce pain and swelling. You may also need to do certain exercises to maintain full movement and to strengthen the muscles of the shoulder, forearm, and elbow (physical therapy). Follow these instructions at home: If you have a brace: Wear the brace as told by your health care provider. Remove it only as told by your health care provider. Loosen the brace if your fingers tingle, become numb, or turn cold and blue. Keep the brace clean. If the  brace is not waterproof: Do not let it get wet. Cover it with a watertight covering when you take a bath or shower. Managing pain, stiffness, and swelling  If directed, put ice on your elbow: Put ice in a plastic bag. Place a towel between your skin and the bag. Leave the ice on for 20 minutes, 2-3 times a day. Move your fingers often to reduce  stiffness and swelling. Elevate your elbow above the level of your heart while you are sitting or lying down. Activity Avoid activities that cause elbow pain. Return to your normal activities as told by your health care provider. Ask your health care provider what activities are safe for you. Ask your health care provider when it is safe to drive if you have an elbow brace. Do exercises as told by your health care provider. General instructions Wear an elastic bandage or compression wrap only as told by your health care provider. Take over-the-counter and prescription medicines only as told by your health care provider. Do not use any products that contain nicotine or tobacco, such as cigarettes, e-cigarettes, and chewing tobacco. These can delay healing. If you need help quitting, ask your health care provider. Keep all follow-up visits as told by your health care provider. This is important. Contact a health care provider if: Your symptoms get worse. You develop new symptoms. Your symptoms have not improved with home care after two weeks. Get help right away if: You have severe pain. Your fingers turn white, very red, or cold and blue. Summary An elbow sprain is an injury to a ligament in the elbow. An elbow sprain can be from an acute injury or repetitive stress. Symptoms include pain, swelling, loss of movement, and bruising. At first, this condition may be treated by protecting, resting, icing, applying pressure (compression), and elevating the injured elbow above the level of your heart. This is known as PRICE therapy. Other treatments depend on the severity of the sprain. This information is not intended to replace advice given to you by your health care provider. Make sure you discuss any questions you have with your health care provider. Document Revised: 11/01/2020 Document Reviewed: 11/01/2020 Elsevier Patient Education  Grant Wilson.

## 2022-02-20 NOTE — Progress Notes (Signed)
Subjective:    Patient ID: Grant Wilson, male    DOB: [birth year], 56 y.o.   MRN: [id number]  56y/o african [place] male established patient works Teacher, English as a foreign language.  Right hand dominant has had left elbow pain mostly muscular below the joint but when driving especially when turning steering wheel discomfort radiates up into triceps/biceps.  Denied trauma/injury/swelling/bruising/rash.  Has used tennis elbow strap in the past but cannot find his strap.  Biofreeze has also helped and would like some from clinic supply today also.      Review of Systems  Constitutional:  Negative for activity change, appetite change, chills, diaphoresis, fatigue and fever.  HENT:  Negative for congestion, trouble swallowing and voice change.   Eyes:  Negative for photophobia and visual disturbance.  Respiratory:  Negative for cough, shortness of breath, wheezing and stridor.   Cardiovascular:  Negative for leg swelling.  Gastrointestinal:  Negative for diarrhea, nausea and vomiting.  Endocrine: Negative for cold intolerance and heat intolerance.  Genitourinary:  Negative for difficulty urinating.  Musculoskeletal:  Positive for myalgias. Negative for back pain, gait problem, joint swelling, neck pain and neck stiffness.  Skin:  Negative for color change, pallor, rash and wound.  Allergic/Immunologic: Negative for food allergies.  Neurological:  Negative for dizziness, tremors, seizures, syncope, speech difficulty, weakness, numbness and headaches.  Hematological:  Negative for adenopathy. Does not bruise/bleed easily.  Psychiatric/Behavioral:  Negative for agitation, confusion and sleep disturbance.        Objective:   Physical Exam Vitals and nursing note reviewed.  Constitutional:      General: He is awake. He is not in acute distress.    Appearance: Normal appearance. He is well-developed, well-groomed and normal weight. He is not ill-appearing, toxic-appearing or diaphoretic.  HENT:      Head: Normocephalic and atraumatic.     Jaw: There is normal jaw occlusion.     Salivary Glands: Right salivary gland is not diffusely enlarged. Left salivary gland is not diffusely enlarged.     Right Ear: Hearing and external ear normal.     Left Ear: Hearing and external ear normal.     Nose: Nose normal. No congestion or rhinorrhea.     Mouth/Throat:     Lips: Pink. No lesions.     Mouth: Mucous membranes are moist.     Pharynx: Oropharynx is clear.  Eyes:     General: Lids are normal. Vision grossly intact. Gaze aligned appropriately. No allergic shiner or scleral icterus.       Right eye: No discharge.        Left eye: No discharge.     Extraocular Movements: Extraocular movements intact.     Conjunctiva/sclera: Conjunctivae normal.     Pupils: Pupils are equal, round, and reactive to light.  Neck:     Trachea: Trachea and phonation normal. No tracheal deviation.  Cardiovascular:     Rate and Rhythm: Normal rate and regular rhythm.     Pulses: Normal pulses.          Radial pulses are 2+ on the right side and 2+ on the left side.  Pulmonary:     Effort: Pulmonary effort is normal. No respiratory distress.     Breath sounds: Normal breath sounds and air entry. No stridor or transmitted upper airway sounds. No wheezing.     Comments: Spoke full sentences without difficulty; no cough observed in exam room Abdominal:     General: Abdomen is flat.  Palpations: Abdomen is soft.  Musculoskeletal:        General: Tenderness present. No swelling or deformity. Normal range of motion.     Right shoulder: No swelling, deformity, effusion, laceration, tenderness, bony tenderness or crepitus. Normal range of motion. Normal strength.     Left shoulder: No swelling, deformity, effusion, laceration, tenderness, bony tenderness or crepitus. Normal range of motion. Normal strength.     Right upper arm: No swelling, edema, deformity, lacerations, tenderness or bony tenderness.     Left upper  arm: Tenderness present. No swelling, edema, deformity, lacerations or bony tenderness.     Right elbow: No swelling, deformity, effusion or lacerations. Normal range of motion. No tenderness.     Left elbow: No swelling, deformity, effusion or lacerations. Normal range of motion. No tenderness.     Right forearm: No swelling, edema, deformity, lacerations, tenderness or bony tenderness.     Left forearm: Tenderness present. No swelling, edema, deformity, lacerations or bony tenderness.     Right wrist: No swelling, deformity, effusion, lacerations or crepitus. Normal range of motion.     Left wrist: No swelling, deformity, effusion, lacerations or crepitus. Normal range of motion.     Right hand: No swelling, deformity, lacerations, tenderness or bony tenderness. Normal range of motion. Normal strength. Normal capillary refill.     Left hand: No swelling, deformity, lacerations, tenderness or bony tenderness. Normal range of motion. Normal strength. Normal capillary refill.       Arms:     Cervical back: Normal range of motion and neck supple. No swelling, edema, deformity, erythema, signs of trauma, lacerations, rigidity, spasms, tenderness, bony tenderness or crepitus. No pain with movement or muscular tenderness. Normal range of motion.     Thoracic back: No swelling, edema, deformity, signs of trauma, lacerations, spasms or tenderness. Normal range of motion.     Right lower leg: No edema.     Left lower leg: No edema.     Comments: Proximal left forearm soft tissue TTP (lateral epicondylitis), distal triceps insertion and biceps insertion mildly TTP; no defects palpated/bruising/swelling/erythema observed  Lymphadenopathy:     Head:     Right side of head: No submandibular or preauricular adenopathy.     Left side of head: No submandibular or preauricular adenopathy.     Cervical: No cervical adenopathy.     Right cervical: No superficial cervical adenopathy.    Left cervical: No  superficial cervical adenopathy.  Skin:    General: Skin is warm and dry.     Capillary Refill: Capillary refill takes less than 2 seconds.     Coloration: Skin is not ashen, cyanotic, jaundiced, mottled, pale or sallow.     Findings: No abrasion, abscess, acne, bruising, burn, ecchymosis, erythema, signs of injury, laceration, lesion, petechiae, rash or wound.     Nails: There is no clubbing.  Neurological:     General: No focal deficit present.     Mental Status: He is alert and oriented to person, place, and time. Mental status is at baseline.     GCS: GCS eye subscore is 4. GCS verbal subscore is 5. GCS motor subscore is 6.     Cranial Nerves: Cranial nerves 2-12 are intact. No cranial nerve deficit, dysarthria or facial asymmetry.     Sensory: Sensation is intact.     Motor: Motor function is intact. No weakness, tremor, atrophy, abnormal muscle tone or seizure activity.     Coordination: Coordination is intact. Coordination normal.  Gait: Gait is intact. Gait normal.     Comments: In/out of chair without difficulty; gait sure and steady in clinic; bilateral hand grasp equal 5/5  Psychiatric:        Attention and Perception: Attention and perception normal.        Mood and Affect: Mood and affect normal.        Speech: Speech normal.        Behavior: Behavior normal. Behavior is cooperative.        Thought Content: Thought content normal.        Cognition and Memory: Cognition and memory normal.        Judgment: Judgment normal.      Radiates into triceps and tennis elbow on exam given compression sleeve from clinic stock size small fitted and distributed by me to patient.  Consider tennis elbow strap if no improvement with slip on compression garment  Patient verbalized understanding information/instructions, agreed with plan of care and had no further questions at this time.     Assessment & Plan:  A-left elbow strain, initial encounter, elevated blood pressure with  diagnosis of hypertension  P-Patient given [employer] handout on elbow strain.  Discussed tylenol '1000mg'$  po q6h prn pain, compression garment  x 2 weeks, stretching, consider lateral epicondylitis strap if no improvement with plan of care and 10 minutes icing TID. Will consider orthopedics and formal PT if fails conservative treatment.  Biorfreeze topical gel QID prn pain. Patient verbalized understanding of instructions, agreed with plan of care and had no further questions at this time.   Continue DASH diet, weight loss efforts, avoiding more than 1 serving caffeine per day, taking prescribed medication for hypertension.  If worst headache of life, chest pain, visual changes, dyspnea call 911/go to ER.  Follow up with PCM as scheduled.  Denied pain at rest today but 5/10 with activity especially driving left elbow could be contributing to blood pressure not at goal today.  Follow up in 2 weeks for repeat blood pressure check with clinic staff.  Patient verbalized understanding information/instructions, agreed with plan of care and had no further questions at this time.

## 2022-02-21 DIAGNOSIS — R140 Abdominal distension (gaseous): Secondary | ICD-10-CM | POA: Insufficient documentation

## 2022-03-02 ENCOUNTER — Ambulatory Visit (HOSPITAL_COMMUNITY)
Admission: EM | Admit: 2022-03-02 | Discharge: 2022-03-02 | Discharge status: Against Medical Advice | Payer: No Typology Code available for payment source | Attending: Family Medicine | Admitting: Family Medicine

## 2022-04-24 ENCOUNTER — Ambulatory Visit: Payer: No Typology Code available for payment source

## 2022-05-13 ENCOUNTER — Ambulatory Visit: Payer: No Typology Code available for payment source | Admitting: Registered Nurse

## 2022-05-13 ENCOUNTER — Encounter: Payer: Self-pay | Admitting: Registered Nurse

## 2022-05-13 VITALS — BP 160/86 | HR 91 | Temp 97.2°F | Resp 16

## 2022-05-13 DIAGNOSIS — I10 Essential (primary) hypertension: Secondary | ICD-10-CM

## 2022-05-13 MED ORDER — HYDROCHLOROTHIAZIDE 25 MG PO TABS: 25.0000 mg | ORAL_TABLET | Freq: Every day | ORAL | 0 refills | Status: DC

## 2022-05-13 NOTE — Patient Instructions (Signed)
Managing Your Hypertension Hypertension, also called high blood pressure, is when the force of the blood pressing against the walls of the arteries is too strong. Arteries are blood vessels that carry blood from your heart throughout your body. Hypertension forces the heart to work harder to pump blood and may cause the arteries to become narrow or stiff. Understanding blood pressure readings A blood pressure reading includes a higher number over a lower number: The first, or top, number is called the systolic pressure. It is a measure of the pressure in your arteries as your heart beats. The second, or bottom number, is called the diastolic pressure. It is a measure of the pressure in your arteries as the heart relaxes. For most people, a normal blood pressure is below 120/80. Your personal target blood pressure may vary depending on your medical conditions, your age, and other factors. Blood pressure is classified into four stages. Based on your blood pressure reading, your health care provider may use the following stages to determine what type of treatment you need, if any. Systolic pressure and diastolic pressure are measured in a unit called millimeters of mercury (mmHg). Normal Systolic pressure: below 120. Diastolic pressure: below 80. Elevated Systolic pressure: 120-129. Diastolic pressure: below 80. Hypertension stage 1 Systolic pressure: 130-139. Diastolic pressure: 80-89. Hypertension stage 2 Systolic pressure: 140 or above. Diastolic pressure: 90 or above. How can this condition affect me? Managing your hypertension is very important. Over time, hypertension can damage the arteries and decrease blood flow to parts of the body, including the brain, heart, and kidneys. Having untreated or uncontrolled hypertension can lead to: A heart attack. A stroke. A weakened blood vessel (aneurysm). Heart failure. Kidney damage. Eye damage. Memory and concentration problems. Vascular  dementia. What actions can I take to manage this condition? Hypertension can be managed by making lifestyle changes and possibly by taking medicines. Your health care provider will help you make a plan to bring your blood pressure within a normal range. You may be referred for counseling on a healthy diet and physical activity. Nutrition  Eat a diet that is high in fiber and potassium, and low in salt (sodium), added sugar, and fat. An example eating plan is called the DASH diet. DASH stands for Dietary Approaches to Stop Hypertension. To eat this way: Eat plenty of fresh fruits and vegetables. Try to fill one-half of your plate at each meal with fruits and vegetables. Eat whole grains, such as whole-wheat pasta, brown rice, or whole-grain bread. Fill about one-fourth of your plate with whole grains. Eat low-fat dairy products. Avoid fatty cuts of meat, processed or cured meats, and poultry with skin. Fill about one-fourth of your plate with lean proteins such as fish, chicken without skin, beans, eggs, and tofu. Avoid pre-made and processed foods. These tend to be higher in sodium, added sugar, and fat. Reduce your daily sodium intake. Many people with hypertension should eat less than 1,500 mg of sodium a day. Lifestyle  Work with your health care provider to maintain a healthy body weight or to lose weight. Ask what an ideal weight is for you. Get at least 30 minutes of exercise that causes your heart to beat faster (aerobic exercise) most days of the week. Activities may include walking, swimming, or biking. Include exercise to strengthen your muscles (resistance exercise), such as weight lifting, as part of your weekly exercise routine. Try to do these types of exercises for 30 minutes at least 3 days a week. Do   not use any products that contain nicotine or tobacco. These products include cigarettes, chewing tobacco, and vaping devices, such as e-cigarettes. If you need help quitting, ask your  health care provider. Control any long-term (chronic) conditions you have, such as high cholesterol or diabetes. Identify your sources of stress and find ways to manage stress. This may include meditation, deep breathing, or making time for fun activities. Alcohol use Do not drink alcohol if: Your health care provider tells you not to drink. You are pregnant, may be pregnant, or are planning to become pregnant. If you drink alcohol: Limit how much you have to: 0-1 drink a day for women. 0-2 drinks a day for men. Know how much alcohol is in your drink. In the [place] one drink equals one 12 oz bottle of beer (355 mL), one 5 oz glass of wine (148 mL), or one 1 oz glass of hard liquor (44 mL). Medicines Your health care provider may prescribe medicine if lifestyle changes are not enough to get your blood pressure under control and if: Your systolic blood pressure is 130 or higher. Your diastolic blood pressure is 80 or higher. Take medicines only as told by your health care provider. Follow the directions carefully. Blood pressure medicines must be taken as told by your health care provider. The medicine does not work as well when you skip doses. Skipping doses also puts you at risk for problems. Monitoring Before you monitor your blood pressure: Do not smoke, drink caffeinated beverages, or exercise within 30 minutes before taking a measurement. Use the bathroom and empty your bladder (urinate). Sit quietly for at least 5 minutes before taking measurements. Monitor your blood pressure at home as told by your health care provider. To do this: Sit with your back straight and supported. Place your feet flat on the floor. Do not cross your legs. Support your arm on a flat surface, such as a table. Make sure your upper arm is at heart level. Each time you measure, take two or three readings one minute apart and record the results. You may also need to have your blood pressure checked regularly by  your health care provider. General information Talk with your health care provider about your diet, exercise habits, and other lifestyle factors that may be contributing to hypertension. Review all the medicines you take with your health care provider because there may be side effects or interactions. Keep all follow-up visits. Your health care provider can help you create and adjust your plan for managing your high blood pressure. Where to find more information National Heart, Lung, and Blood Institute: www.nhlbi.nih.gov American Heart Association: www.heart.org Contact a health care provider if: You think you are having a reaction to medicines you have taken. You have repeated (recurrent) headaches. You feel dizzy. You have swelling in your ankles. You have trouble with your vision. Get help right away if: You develop a severe headache or confusion. You have unusual weakness or numbness, or you feel faint. You have severe pain in your chest or abdomen. You vomit repeatedly. You have trouble breathing. These symptoms may be an emergency. Get help right away. Call 911. Do not wait to see if the symptoms will go away. Do not drive yourself to the hospital. Summary Hypertension is when the force of blood pumping through your arteries is too strong. If this condition is not controlled, it may put you at risk for serious complications. Your personal target blood pressure may vary depending on your medical conditions,   your age, and other factors. For most people, a normal blood pressure is less than 120/80. Hypertension is managed by lifestyle changes, medicines, or both. Lifestyle changes to help manage hypertension include losing weight, eating a healthy, low-sodium diet, exercising more, stopping smoking, and limiting alcohol. This information is not intended to replace advice given to you by your health care provider. Make sure you discuss any questions you have with your health care  provider. Document Revised: 04/18/2021 Document Reviewed: 04/18/2021 Elsevier Patient Education  [place]. Hypokalemia Hypokalemia means that the amount of potassium in the blood is lower than normal. Potassium is a mineral (electrolyte) that helps regulate the amount of fluid in the body. It also stimulates muscle tightening (contraction) and helps nerves work properly. Normally, most of the body's potassium is inside cells, and only a very small amount is in the blood. Because the amount in the blood is so small, minor changes to potassium levels in the blood can be life-threatening. What are the causes? This condition may be caused by: Antibiotic medicine. Diarrhea or vomiting. Taking too much of a medicine that helps you have a bowel movement (laxative) can cause diarrhea and lead to hypokalemia. Chronic kidney disease (CKD). Medicines that help the body get rid of excess fluid (diuretics). Eating disorders, such as anorexia or bulimia. Low magnesium levels in the body. Sweating a lot. What are the signs or symptoms? Symptoms of this condition include: Weakness. Constipation. Fatigue. Muscle cramps. Mental confusion. Skipped heartbeats or irregular heartbeat (palpitations). Tingling or numbness. How is this diagnosed? This condition is diagnosed with a blood test. How is this treated? This condition may be treated by: Taking potassium supplements. Adjusting the medicines that you take. Eating more foods that contain a lot of potassium. If your potassium level is very low, you may need to get potassium through an IV and be monitored in the hospital. Follow these instructions at home: Eating and drinking  Eat a healthy diet. A healthy diet includes fresh fruits and vegetables, whole grains, healthy fats, and lean proteins. If told, eat more foods that contain a lot of potassium. These include: Nuts, such as peanuts and pistachios. Seeds, such as sunflower seeds and  pumpkin seeds. Peas, lentils, and lima beans. Whole grain and bran cereals and breads. Fresh fruits and vegetables, such as apricots, avocado, bananas, cantaloupe, kiwi, oranges, tomatoes, asparagus, and potatoes. Juices, such as orange, tomato, and prune. Lean meats, including fish. Milk and milk products, such as yogurt. General instructions Take over-the-counter and prescription medicines only as told by your health care provider. This includes vitamins, natural food products, and supplements. Keep all follow-up visits. This is important. Contact a health care provider if: You have weakness that gets worse. You feel your heart pounding or racing. You vomit. You have diarrhea. You have diabetes and you have trouble keeping your blood sugar in your target range. Get help right away if: You have chest pain. You have shortness of breath. You have vomiting or diarrhea that lasts for more than 2 days. You faint. These symptoms may be an emergency. Get help right away. Call 911. Do not wait to see if the symptoms will go away. Do not drive yourself to the hospital. Summary Hypokalemia means that the amount of potassium in the blood is lower than normal. This condition is diagnosed with a blood test. Hypokalemia may be treated by taking potassium supplements, adjusting the medicines that you take, or eating more foods that are high in potassium.  If your potassium level is very low, you may need to get potassium through an IV and be monitored in the hospital. This information is not intended to replace advice given to you by your health care provider. Make sure you discuss any questions you have with your health care provider. Document Revised: 04/18/2021 Document Reviewed: 04/18/2021 Elsevier Patient Education  [place] Eating Plan DASH stands for Dietary Approaches to Stop Hypertension. The DASH eating plan is a healthy eating plan that has been shown to: Reduce high  blood pressure (hypertension). Reduce your risk for type 2 diabetes, heart disease, and stroke. Help with weight loss. What are tips for following this plan? Reading food labels Check food labels for the amount of salt (sodium) per serving. Choose foods with less than 5 percent of the Daily Value of sodium. Generally, foods with less than 300 milligrams (mg) of sodium per serving fit into this eating plan. To find whole grains, look for the word "whole" as the first word in the ingredient list. Shopping Buy products labeled as "low-sodium" or "no salt added." Buy fresh foods. Avoid canned foods and pre-made or frozen meals. Cooking Avoid adding salt when cooking. Use salt-free seasonings or herbs instead of table salt or sea salt. Check with your health care provider or pharmacist before using salt substitutes. Do not fry foods. Cook foods using healthy methods such as baking, boiling, grilling, roasting, and broiling instead. Cook with heart-healthy oils, such as olive, canola, avocado, soybean, or sunflower oil. Meal planning  Eat a balanced diet that includes: 4 or more servings of fruits and 4 or more servings of vegetables each day. Try to fill one-half of your plate with fruits and vegetables. 6-8 servings of whole grains each day. Less than 6 oz (170 g) of lean meat, poultry, or fish each day. A 3-oz (85-g) serving of meat is about the same size as a deck of cards. One egg equals 1 oz (28 g). 2-3 servings of low-fat dairy each day. One serving is 1 cup (237 mL). 1 serving of nuts, seeds, or beans 5 times each week. 2-3 servings of heart-healthy fats. Healthy fats called omega-3 fatty acids are found in foods such as walnuts, flaxseeds, fortified milks, and eggs. These fats are also found in cold-water fish, such as sardines, salmon, and mackerel. Limit how much you eat of: Canned or prepackaged foods. Food that is high in trans fat, such as some fried foods. Food that is high in  saturated fat, such as fatty meat. Desserts and other sweets, sugary drinks, and other foods with added sugar. Full-fat dairy products. Do not salt foods before eating. Do not eat more than 4 egg yolks a week. Try to eat at least 2 vegetarian meals a week. Eat more home-cooked food and less restaurant, buffet, and fast food. Lifestyle When eating at a restaurant, ask that your food be prepared with less salt or no salt, if possible. If you drink alcohol: Limit how much you use to: 0-1 drink a day for women who are not pregnant. 0-2 drinks a day for men. Be aware of how much alcohol is in your drink. In the [place] one drink equals one 12 oz bottle of beer (355 mL), one 5 oz glass of wine (148 mL), or one 1 oz glass of hard liquor (44 mL). General information Avoid eating more than 2,300 mg of salt a day. If you have hypertension, you may need to reduce your sodium intake to  1,500 mg a day. Work with your health care provider to maintain a healthy body weight or to lose weight. Ask what an ideal weight is for you. Get at least 30 minutes of exercise that causes your heart to beat faster (aerobic exercise) most days of the week. Activities may include walking, swimming, or biking. Work with your health care provider or dietitian to adjust your eating plan to your individual calorie needs. What foods should I eat? Fruits All fresh, dried, or frozen fruit. Canned fruit in natural juice (without added sugar). Vegetables Fresh or frozen vegetables (raw, steamed, roasted, or grilled). Low-sodium or reduced-sodium tomato and vegetable juice. Low-sodium or reduced-sodium tomato sauce and tomato paste. Low-sodium or reduced-sodium canned vegetables. Grains Whole-grain or whole-wheat bread. Whole-grain or whole-wheat pasta. Brown rice. [place]. Bulgur. Whole-grain and low-sodium cereals. Pita bread. Low-fat, low-sodium crackers. Whole-wheat flour tortillas. Meats and other proteins Skinless  chicken or [place]. Ground chicken or [place]. Pork with fat trimmed off. Fish and seafood. Egg whites. Dried beans, peas, or lentils. Unsalted nuts, nut butters, and seeds. Unsalted canned beans. Lean cuts of beef with fat trimmed off. Low-sodium, lean precooked or cured meat, such as sausages or meat loaves. Dairy Low-fat (1%) or fat-free (skim) milk. Reduced-fat, low-fat, or fat-free cheeses. Nonfat, low-sodium ricotta or cottage cheese. Low-fat or nonfat yogurt. Low-fat, low-sodium cheese. Fats and oils Soft margarine without trans fats. Vegetable oil. Reduced-fat, low-fat, or light mayonnaise and salad dressings (reduced-sodium). Canola, safflower, olive, avocado, soybean, and sunflower oils. Avocado. Seasonings and condiments Herbs. Spices. Seasoning mixes without salt. Other foods Unsalted popcorn and pretzels. Fat-free sweets. The items listed above may not be a complete list of foods and beverages you can eat. Contact a dietitian for more information. What foods should I avoid? Fruits Canned fruit in a light or heavy syrup. Fried fruit. Fruit in cream or butter sauce. Vegetables Creamed or fried vegetables. Vegetables in a cheese sauce. Regular canned vegetables (not low-sodium or reduced-sodium). Regular canned tomato sauce and paste (not low-sodium or reduced-sodium). Regular tomato and vegetable juice (not low-sodium or reduced-sodium). Angie Fava. Olives. Grains Baked goods made with fat, such as croissants, muffins, or some breads. Dry pasta or rice meal packs. Meats and other proteins Fatty cuts of meat. Ribs. Fried meat. Berniece Salines. Bologna, salami, and other precooked or cured meats, such as sausages or meat loaves. Fat from the back of a pig (fatback). Bratwurst. Salted nuts and seeds. Canned beans with added salt. Canned or smoked fish. Whole eggs or egg yolks. Chicken or [place] with skin. Dairy Whole or 2% milk, cream, and half-and-half. Whole or full-fat cream cheese. Whole-fat or  sweetened yogurt. Full-fat cheese. Nondairy creamers. Whipped toppings. Processed cheese and cheese spreads. Fats and oils Butter. Stick margarine. Lard. Shortening. Ghee. Bacon fat. Tropical oils, such as coconut, palm kernel, or palm oil. Seasonings and condiments Onion salt, garlic salt, seasoned salt, table salt, and sea salt. Worcestershire sauce. Tartar sauce. Barbecue sauce. Teriyaki sauce. Soy sauce, including reduced-sodium. Steak sauce. Canned and packaged gravies. Fish sauce. Oyster sauce. Cocktail sauce. Store-bought horseradish. Ketchup. Mustard. Meat flavorings and tenderizers. Bouillon cubes. Hot sauces. Pre-made or packaged marinades. Pre-made or packaged taco seasonings. Relishes. Regular salad dressings. Other foods Salted popcorn and pretzels. The items listed above may not be a complete list of foods and beverages you should avoid. Contact a dietitian for more information. Where to find more information National Heart, Lung, and Blood Institute: https://wilson-eaton.com/ American Heart Association: www.heart.org Academy of Nutrition and Dietetics:  www.eatright.[place]: www.kidney.org Summary The DASH eating plan is a healthy eating plan that has been shown to reduce high blood pressure (hypertension). It may also reduce your risk for type 2 diabetes, heart disease, and stroke. When on the DASH eating plan, aim to eat more fresh fruits and vegetables, whole grains, lean proteins, low-fat dairy, and heart-healthy fats. With the DASH eating plan, you should limit salt (sodium) intake to 2,300 mg a day. If you have hypertension, you may need to reduce your sodium intake to 1,500 mg a day. Work with your health care provider or dietitian to adjust your eating plan to your individual calorie needs. This information is not intended to replace advice given to you by your health care provider. Make sure you discuss any questions you have with your health care  provider. Document Revised: 07/08/2019 Document Reviewed: 07/08/2019 Elsevier Patient Education  [place].

## 2022-05-13 NOTE — Progress Notes (Signed)
Subjective:    Patient ID: Grant Wilson, male    DOB: 10-27-1965, 56 y.o.   MRN: [id number]  56y/o african [place] established male here for refill hydrochlorothiazide '25mg'$  po daily.  Had seen [facility] Dr Mitchel Honour earlier this year and also started on losartan as blood pressure not at goal but developed blurred vision and stopped his losartan approximately 01/23/22 and only sporadically taking his hydrochlorothiazide.  Last fill per paper chart review 11/05/21 from [facility]. Patient stated ran out about a month ago.  Denied leg swelling, headaches, visual changes.  Feeling well works in Engineer, technical sales at [employer].  Has been moving a lot of workstations recently as depts moving across warehouse and rolling out windows update which hasn't gone smoothly and reason patient came in for refill today as concerned his blood pressure will be up.  Last seen in clinic July for elbow pain BP 150/84      Review of Systems  Constitutional:  Positive for activity change. Negative for appetite change, chills, diaphoresis, fatigue, fever and unexpected weight change.  HENT:  Negative for trouble swallowing and voice change.   Eyes:  Negative for photophobia and visual disturbance.  Respiratory:  Negative for cough, choking, chest tightness, shortness of breath, wheezing and stridor.   Gastrointestinal:  Negative for diarrhea, nausea and vomiting.  Genitourinary:  Negative for difficulty urinating.  Musculoskeletal:  Negative for gait problem.  Skin:  Negative for rash.  Neurological:  Negative for dizziness, tremors, seizures, syncope, facial asymmetry, speech difficulty, weakness, light-headedness, numbness and headaches.  Psychiatric/Behavioral:  Negative for agitation, confusion and sleep disturbance.        Objective:   Physical Exam Vitals and nursing note reviewed.  Constitutional:      General: He is awake. He is not in acute distress.    Appearance: Normal appearance. He is well-developed,  well-groomed and normal weight. He is not ill-appearing, toxic-appearing or diaphoretic.  HENT:     Head: Normocephalic and atraumatic.     Jaw: There is normal jaw occlusion.     Salivary Glands: Right salivary gland is not diffusely enlarged or tender. Left salivary gland is not diffusely enlarged or tender.     Right Ear: Hearing and external ear normal.     Left Ear: Hearing and external ear normal.     Nose: Nose normal. No congestion or rhinorrhea.     Mouth/Throat:     Lips: Pink. No lesions.     Mouth: Mucous membranes are moist. No oral lesions or angioedema.     Dentition: No gum lesions.     Tongue: No lesions. Tongue does not deviate from midline.     Palate: No mass and lesions.     Pharynx: Oropharynx is clear. Uvula midline. No uvula swelling.  Eyes:     General: Lids are normal. Vision grossly intact. Gaze aligned appropriately. No allergic shiner or scleral icterus.       Right eye: No discharge.        Left eye: No discharge.     Extraocular Movements: Extraocular movements intact.     Right eye: Normal extraocular motion and no nystagmus.     Left eye: Normal extraocular motion and no nystagmus.     Conjunctiva/sclera: Conjunctivae normal.     Right eye: Right conjunctiva is not injected. No chemosis, exudate or hemorrhage.    Left eye: Left conjunctiva is not injected. No chemosis, exudate or hemorrhage.    Pupils: Pupils are equal,  round, and reactive to light.  Neck:     Trachea: Trachea and phonation normal. No tracheal deviation.  Cardiovascular:     Rate and Rhythm: Normal rate and regular rhythm.     Pulses: Normal pulses.          Radial pulses are 2+ on the right side and 2+ on the left side.     Heart sounds: Normal heart sounds, S1 normal and S2 normal. No murmur heard.    No friction rub. No gallop.  Pulmonary:     Effort: Pulmonary effort is normal. No respiratory distress.     Breath sounds: Normal breath sounds and air entry. No stridor or  transmitted upper airway sounds. No wheezing, rhonchi or rales.     Comments: Spoke full sentences without difficulty; no cough observed in exam room Abdominal:     General: Abdomen is flat.     Palpations: Abdomen is soft.  Musculoskeletal:        General: No swelling or tenderness. Normal range of motion.     Right hand: Normal strength. Normal capillary refill.     Left hand: Normal strength. Normal capillary refill.     Cervical back: Normal range of motion and neck supple. No swelling, edema, deformity, erythema, signs of trauma, lacerations, rigidity, spasms, tenderness or crepitus. No pain with movement. Normal range of motion.     Thoracic back: No swelling, edema, deformity, signs of trauma, lacerations, spasms or tenderness.     Lumbar back: No swelling, edema, deformity, signs of trauma, lacerations, spasms or tenderness.     Right lower leg: No edema.     Left lower leg: No edema.  Lymphadenopathy:     Head:     Right side of head: No submandibular or preauricular adenopathy.     Left side of head: No submandibular or preauricular adenopathy.     Cervical:     Right cervical: No superficial cervical adenopathy.    Left cervical: No superficial cervical adenopathy.  Skin:    General: Skin is warm and dry.     Capillary Refill: Capillary refill takes less than 2 seconds.     Coloration: Skin is not ashen, cyanotic, jaundiced, mottled, pale or sallow.     Findings: No abrasion, abscess, acne, bruising, burn, ecchymosis, erythema, signs of injury, laceration, lesion, petechiae, rash or wound.     Nails: There is no clubbing.  Neurological:     General: No focal deficit present.     Mental Status: He is alert and oriented to person, place, and time. Mental status is at baseline.     GCS: GCS eye subscore is 4. GCS verbal subscore is 5. GCS motor subscore is 6.     Cranial Nerves: Cranial nerves 2-12 are intact. No cranial nerve deficit, dysarthria or facial asymmetry.      Sensory: Sensation is intact.     Motor: Motor function is intact. No weakness, tremor, atrophy, abnormal muscle tone or seizure activity.     Coordination: Coordination is intact. Coordination normal.     Gait: Gait is intact. Gait normal.     Comments: In/out of chair without difficulty; gait sure and steady in clinic; bilateral hand grasp equal 5/5  Psychiatric:        Attention and Perception: Attention and perception normal.        Mood and Affect: Mood and affect normal.        Speech: Speech normal.  Behavior: Behavior normal. Behavior is cooperative.        Thought Content: Thought content normal.        Cognition and Memory: Cognition and memory normal.        Judgment: Judgment normal.           Assessment & Plan:   A-essential hypertension   P-take '25mg'$  hydrochlorothiazide daily by mouth at the same time. Return to clinic in 2 hours today for repeat blood pressure check after taking medication today dispensed #90 RF0 from [facility] to patient today.   If blood pressure remaining systolic greater than 389 or diastolic greater than 80 to start amlodipine '5mg'$  po daily in evening.  Patient to follow up next week after taking medication every day for repeat blood pressure check.  Continue his log at home and bring in his machine for comparison with clinic equipment readings.  DASH diet.  Patient to schedule routine eye appt with optometry and follow up with PCM as he needs to be aware of side effects losartan and manage blood pressure as [facility] is not a PCM office. Labs due Nov/Dec for annual.  Patient verbalized understanding information/instructions, agreed with plan of care and had no further questions at this time.

## 2022-05-27 ENCOUNTER — Telehealth: Payer: Self-pay | Admitting: Registered Nurse

## 2022-05-27 DIAGNOSIS — R7303 Prediabetes: Secondary | ICD-10-CM

## 2022-05-27 DIAGNOSIS — R202 Paresthesia of skin: Secondary | ICD-10-CM

## 2022-05-27 DIAGNOSIS — E559 Vitamin D deficiency, unspecified: Secondary | ICD-10-CM

## 2022-06-05 ENCOUNTER — Ambulatory Visit: Payer: No Typology Code available for payment source

## 2022-06-05 DIAGNOSIS — R7303 Prediabetes: Secondary | ICD-10-CM

## 2022-06-05 DIAGNOSIS — R202 Paresthesia of skin: Secondary | ICD-10-CM

## 2022-06-05 DIAGNOSIS — I10 Essential (primary) hypertension: Secondary | ICD-10-CM

## 2022-06-05 NOTE — Progress Notes (Signed)
Labs drawn from Left antecubital without difficulty.  2x2 & coflex applied.  Patient instructed to leave in place for 10-15 minutes and replace with bandaid given to patient.  Patient verbalized understanding.

## 2022-06-06 LAB — SPECIMEN STATUS REPORT

## 2022-06-07 ENCOUNTER — Encounter: Payer: Self-pay | Admitting: Registered Nurse

## 2022-06-07 MED ORDER — CHOLECALCIFEROL 1.25 MG (50000 UT) PO TABS: 1.0000 | ORAL_TABLET | ORAL | 0 refills | Status: AC

## 2022-06-07 NOTE — Telephone Encounter (Signed)
See results note  My chart message sent to patient   Ranell,   I recommend follow up with PCM.  Your vitamin D level is low again.  Have you been taking any vitamin D supplement?  I sent in a prescription for once a week by mouth cholecalciferol 50,000 units with meal to your pharmacy.  Recheck vitamin D level in 3 months nonfasting.  Your blood sugar is still elevated and worsening.  I recommend appt with dietitian (free see handout).  Avoid dehydration and drink water to keep urine pale yellow and urinating every 2-4 hours while awake.  Avoid added sugars in diet greater than 35 grams per day or 7 teaspoons.  Exercise/activity after meals eg. Walking 15 minutes and 150 minutes activity per week.  I recommend repeat blood sugar test in 3 months along with vitamin D level.  Goal vitamin D level is 40-60.  Normal blood sugar would be 5.6 or less.  Total cholesterol and LDL still elevated and most likely related to elevated blood sugar.  Increase activity and fiber in diet.  Your white blood cell and neutrophil counts still a little low (chronic)  Your iron level, electrolytes levels, kidney/liver/thyroid function and vitamin B6/B9 levels were normal.  B6 results still not available.  The tingling and numbness most likely related to low vitamin D and elevated blood sugars.  Elevated blood sugars chronic can damage the nerves in the body (diabetic neuropathy).  Handouts sent to my chart account cholesterol, prediabetes, prediabetes diet, diabetic neuropathy, vitamin D deficiency, high fiber diet, elevated cholesterol.  Please let me or Vinnie Level know if you want printout of results/handouts.   Please let me know if further questions or concerns.   Sincerely,   Gerarda Fraction NP-C

## 2022-06-10 LAB — CBC WITH DIFFERENTIAL/PLATELET

## 2022-06-16 ENCOUNTER — Other Ambulatory Visit: Payer: No Typology Code available for payment source

## 2022-06-16 DIAGNOSIS — Z0000 Encounter for general adult medical examination without abnormal findings: Secondary | ICD-10-CM

## 2022-06-16 LAB — CMP12+LP+TP+TSH+6AC+PSA+CBC…
ALT: 21 IU/L (ref 0–44)
AST: 23 IU/L (ref 0–40)
Albumin/Globulin Ratio: 2 (ref 1.2–2.2)
Albumin: 4.5 g/dL (ref 3.8–4.9)
Alkaline Phosphatase: 57 IU/L (ref 44–121)
BUN/Creatinine Ratio: 7 — ABNORMAL LOW (ref 9–20)
BUN: 7 mg/dL (ref 6–24)
Basophils Absolute: 0 10*3/uL (ref 0.0–0.2)
Basos: 1 %
Bilirubin Total: 0.4 mg/dL (ref 0.0–1.2)
Calcium: 9.1 mg/dL (ref 8.7–10.2)
Chloride: 100 mmol/L (ref 96–106)
Chol/HDL Ratio: 4.8 ratio (ref 0.0–5.0)
Cholesterol, Total: 213 mg/dL — ABNORMAL HIGH (ref 100–199)
Creatinine, Ser: 0.97 mg/dL (ref 0.76–1.27)
EOS (ABSOLUTE): 0.1 10*3/uL (ref 0.0–0.4)
Eos: 3 %
Estimated CHD Risk: 1 times avg. (ref 0.0–1.0)
Free Thyroxine Index: 1.5 (ref 1.2–4.9)
GGT: 24 IU/L (ref 0–65)
Globulin, Total: 2.3 g/dL (ref 1.5–4.5)
Glucose: 92 mg/dL (ref 70–99)
HDL: 44 mg/dL (ref >39)
Hematocrit: 44.7 % (ref 37.5–51.0)
Hemoglobin: 14.8 g/dL (ref 13.0–17.7)
Immature Grans (Abs): 0 10*3/uL (ref 0.0–0.1)
Immature Granulocytes: 0 %
Iron: 91 ug/dL (ref 38–169)
LDH: 177 IU/L (ref 121–224)
LDL Chol Calc (NIH): 156 mg/dL — ABNORMAL HIGH (ref 0–99)
Lymphocytes Absolute: 1.3 10*3/uL (ref 0.7–3.1)
Lymphs: 44 %
MCH: 30.8 pg (ref 26.6–33.0)
MCHC: 33.1 g/dL (ref 31.5–35.7)
MCV: 93 fL (ref 79–97)
Monocytes Absolute: 0.4 10*3/uL (ref 0.1–0.9)
Monocytes: 15 %
Neutrophils Absolute: 1.1 10*3/uL — ABNORMAL LOW (ref 1.4–7.0)
Neutrophils: 37 %
Phosphorus: 3.6 mg/dL (ref 2.8–4.1)
Platelets: 228 10*3/uL (ref 150–450)
Potassium: 4.8 mmol/L (ref 3.5–5.2)
Prostate Specific Ag, Serum: 1.9 ng/mL (ref 0.0–4.0)
RBC: 4.81 x10E6/uL (ref 4.14–5.80)
RDW: 13.4 % (ref 11.6–15.4)
Sodium: 137 mmol/L (ref 134–144)
T3 Uptake Ratio: 24 % (ref 24–39)
T4, Total: 6.4 ug/dL (ref 4.5–12.0)
TSH: 2.1 u[IU]/mL (ref 0.450–4.500)
Total Protein: 6.8 g/dL (ref 6.0–8.5)
Triglycerides: 75 mg/dL (ref 0–149)
Uric Acid: 5.3 mg/dL (ref 3.8–8.4)
VLDL Cholesterol Cal: 13 mg/dL (ref 5–40)
WBC: 2.9 10*3/uL — ABNORMAL LOW (ref 3.4–10.8)
eGFR: 92 mL/min/{1.73_m2} (ref >59)

## 2022-06-16 LAB — BASIC METABOLIC PANEL: CO2: 27 mmol/L (ref 20–29)

## 2022-06-16 LAB — VITAMIN B6

## 2022-06-16 LAB — B12 AND FOLATE PANEL
Folate: 12.1 ng/mL (ref >3.0)
Vitamin B-12: 484 pg/mL (ref 232–1245)

## 2022-06-16 LAB — VITAMIN D 25 HYDROXY (VIT D DEFICIENCY, FRACTURES): Vit D, 25-Hydroxy: 13.3 ng/mL — ABNORMAL LOW (ref 30.0–100.0)

## 2022-06-16 LAB — HEMOGLOBIN A1C
Est. average glucose Bld gHb Est-mCnc: 126 mg/dL
Hgb A1c MFr Bld: 6 % — ABNORMAL HIGH (ref 4.8–5.6)

## 2022-06-24 LAB — VITAMIN B6: Vitamin B6: 16 ug/L (ref 3.4–65.2)

## 2022-06-26 ENCOUNTER — Ambulatory Visit: Payer: No Typology Code available for payment source | Admitting: Occupational Medicine

## 2022-06-26 DIAGNOSIS — R070 Pain in throat: Secondary | ICD-10-CM

## 2022-06-26 NOTE — Progress Notes (Signed)
PATIENT complaint of sore thorat still from burning it with soup. Requested Tylenol and Cough drop. Pain 4/10.

## 2022-06-30 ENCOUNTER — Ambulatory Visit: Payer: No Typology Code available for payment source | Admitting: Registered Nurse

## 2022-07-01 ENCOUNTER — Ambulatory Visit: Payer: No Typology Code available for payment source | Admitting: Registered Nurse

## 2022-07-01 ENCOUNTER — Encounter: Payer: Self-pay | Admitting: Registered Nurse

## 2022-07-01 VITALS — BP 132/82 | HR 94 | Temp 97.0°F

## 2022-07-01 DIAGNOSIS — J00 Acute nasopharyngitis [common cold]: Secondary | ICD-10-CM

## 2022-07-01 DIAGNOSIS — J029 Acute pharyngitis, unspecified: Secondary | ICD-10-CM

## 2022-07-01 NOTE — Patient Instructions (Signed)
Allergic Rhinitis, Adult  Allergic rhinitis is an allergic reaction that affects the mucous membrane inside the nose. The mucous membrane is the tissue that produces mucus. There are two types of allergic rhinitis: Seasonal. This type is also called hay fever and happens only during certain seasons. Perennial. This type can happen at any time of the year. Allergic rhinitis cannot be spread from person to person. This condition can be mild, moderate, or severe. It can develop at any age and may be outgrown. What are the causes? This condition is caused by allergens. These are things that can cause an allergic reaction. Allergens may differ for seasonal allergic rhinitis and perennial allergic rhinitis. Seasonal allergic rhinitis is triggered by pollen. Pollen can come from grasses, trees, and weeds. Perennial allergic rhinitis may be triggered by: Dust mites. Proteins in a pet's urine, saliva, or dander. Dander is dead skin cells from a pet. Smoke, mold, or car fumes. What increases the risk? You are more likely to develop this condition if you have a family history of allergies or other conditions related to allergies, including: Allergic conjunctivitis. This is inflammation of parts of the eyes and eyelids. Asthma. This condition affects the lungs and makes it hard to breathe. Atopic dermatitis or eczema. This is long term (chronic) inflammation of the skin. Food allergies. What are the signs or symptoms? Symptoms of this condition include: Sneezing or coughing. A stuffy nose (nasal congestion), itchy nose, or nasal discharge. Itchy eyes and tearing of the eyes. A feeling of mucus dripping down the back of your throat (postnasal drip). Trouble sleeping. Tiredness or fatigue. Headache. Sore throat. How is this diagnosed? This condition may be diagnosed with your symptoms, medical history, and physical exam. Your health care provider may check for related conditions, such  as: Asthma. Pink eye. This is eye inflammation caused by infection (conjunctivitis). Ear infection. Upper respiratory infection. This is an infection in the nose, throat, or upper airways. You may also have tests to find out which allergens trigger your symptoms. These may include skin tests or blood tests. How is this treated? There is no cure for this condition, but treatment can help control symptoms. Treatment may include: Taking medicines that block allergy symptoms, such as corticosteroids and antihistamines. Medicine may be given as a shot, nasal spray, or pill. Avoiding any allergens. Being exposed again and again to tiny amounts of allergens to help you build a defense against allergens (immunotherapy). This is done if other treatments have not helped. It may include: Allergy shots. These are injected medicines that have small amounts of allergen in them. Sublingual immunotherapy. This involves taking small doses of a medicine with allergen in it under your tongue. If these treatments do not work, your health care provider may prescribe newer, stronger medicines. Follow these instructions at home: Avoiding allergens Find out what you are allergic to and avoid those allergens. These are some things you can do to help avoid allergens: If you have perennial allergies: Replace carpet with wood, tile, or vinyl flooring. Carpet can trap dander and dust. Do not smoke. Do not allow smoking in your home. Change your heating and air conditioning filters at least once a month. If you have seasonal allergies, take these steps during allergy season: Keep windows closed as much as possible. Plan outdoor activities when pollen counts are lowest. Check pollen counts before you plan outdoor activities. When coming indoors, change clothing and shower before sitting on furniture or bedding. If you have a pet   in the house that produces allergens: Keep the pet out of the bedroom. Vacuum, sweep, and  dust regularly. General instructions Take over-the-counter and prescription medicines only as told by your health care provider. Drink enough fluid to keep your urine pale yellow. Keep all follow-up visits as told by your health care provider. This is important. Where to find more information American Academy of Allergy, Asthma & Immunology: www.aaaai.org Contact a health care provider if: You have a fever. You develop a cough that does not go away. You make whistling sounds when you breathe (wheeze). Your symptoms slow you down or stop you from doing your normal activities each day. Get help right away if: You have shortness of breath. This symptom may represent a serious problem that is an emergency. Do not wait to see if the symptom will go away. Get medical help right away. Call your local emergency services (911 in the [place] Do not drive yourself to the hospital. Summary Allergic rhinitis may be managed by taking medicines as directed and avoiding allergens. If you have seasonal allergies, keep windows closed as much as possible during allergy season. Contact your health care provider if you develop a fever or a cough that does not go away. This information is not intended to replace advice given to you by your health care provider. Make sure you discuss any questions you have with your health care provider. Document Revised: 01/16/2022 Document Reviewed: 08/02/2019 Elsevier Patient Education  [place]. Nonallergic Rhinitis Nonallergic rhinitis is inflammation of the mucous membrane inside the nose. The mucous membrane is the tissue that produces mucus. This condition is different from having allergic rhinitis, which is an allergy that affects the nose. Allergic rhinitis occurs when the body's defense system, or immune system, reacts to a substance that a person is allergic to (allergen), such as pollen, pet dander, mold, or dust. Nonallergic rhinitis has many similar symptoms,  but it is not caused by allergens. Nonallergic rhinitis can be an acute or chronic problem. This means it can be short-term or long-term. What are the causes? This condition may be caused by many different things. Some common types of nonallergic rhinitis include: Infectious rhinitis. This is usually caused by an infection in the nose, throat, or upper airways (upper respiratory system). Vasomotor rhinitis. This is the most common type of chronic nonallergic rhinitis. It is caused by too much blood flow through your nose, and it leads to swelling in your nose. It is triggered by strong odors, cold air, stress, drinking alcohol, cigarette smoke, or changes in the weather. Occupational rhinitis. This type is caused by triggers in the workplace, such as chemicals, dust, animal dander, or air pollution. Hormonal rhinitis, in teenage girls and women. This type is caused by an increase in the hormone estrogen and may happen during pregnancy, puberty, or monthly menstrual periods. Hormonal rhinitis gives you fewer symptoms when estrogen levels drop. Drug-induced rhinitis. Several types of medicines can cause this, such as medicines for high blood pressure or heart disease, aspirin, or NSAIDs. Nonallergic rhinitis with eosinophilia syndrome (NARES). This type is caused by having too much eosinophil, a type of white blood cell. Other causes include a reaction to eating hot or spicy foods. This does not usually cause long-term symptoms. In some cases, the cause of nonallergic rhinitis is not known. What increases the risk? You are more likely to develop this condition if: You are 86-69 years of age. You are a woman. Women are twice as likely to  have this condition. What are the signs or symptoms? Common symptoms of this condition include: Stuffy nose (nasal congestion). Runny nose. A feeling of mucus dripping down the back of your throat (postnasal drip). Trouble sleeping. Tiredness, or fatigue. Other  symptoms include: Sneezing. Coughing. Itchy nose. Bloodshot eyes. How is this diagnosed? This type may be diagnosed based on: Your symptoms and medical history. A physical exam. Allergy testing to rule out allergic rhinitis. You may have skin tests or blood tests. Your health care provider may also take a swab of nasal discharge to look for an increased number of eosinophils. This would be done to confirm a diagnosis of NARES. How is this treated? Treatment for this condition depends on the cause. No single treatment works for everyone. Work with your health care provider to find the best treatment for you. Treatment may include: Avoiding the things that trigger your symptoms. Medicines to relieve congestion, such as: Steroid nasal spray. There are many types. You may need to try a few to find out which one works best. Best boy medicine. This treats nasal congestion and may be given by mouth or as a nasal spray. These medicines are used only for a short time. Medicines to relieve a runny nose. These may include antihistamine medicines or anticholinergic nasal sprays. Nasal irrigation. This involves using a salt-water (saline) spray or saline container called a neti pot. Nasal irrigation helps to clear away mucus and keep your nasal passages moist. Surgery to remove part of your mucous membrane. This is done in severe cases if the condition has not improved after 6-12 months of treatment. Follow these instructions at home: Medicines Take or use over-the-counter and prescription medicines only as told by your health care provider. Do not stop using your medicine even if you start to feel better. Do not take NSAIDs, such as ibuprofen, or medicines that contain aspirin if they make your symptoms worse. Lifestyle Do not drink alcohol if it makes your symptoms worse. Do not use any products that contain nicotine or tobacco, such as cigarettes, e-cigarettes, and chewing tobacco. If you need  help quitting, ask your health care provider. Avoid secondhand smoke. General instructions Avoid triggers that make your symptoms worse. Use nasal irrigation as told by your health care provider. Get exercise. Exercise may help reduce symptoms for some people. Sleep with the head of your bed raised. This may reduce nasal congestion when you sleep. Drink enough fluid to keep your urine pale yellow. Keep all follow-up visits as told by your health care provider. This is important. Contact a health care provider if: You have a fever. Your symptoms are getting worse at home. Your symptoms do not lessen with medicine. You develop new symptoms, especially a headache or nosebleed. Summary Nonallergic rhinitis is inflammation inside the nose that is not caused by allergens. Nonallergic rhinitis can be a short-term or long-term problem. Treatment may include avoiding the things that trigger your symptoms. Take or use over-the-counter and prescription medicines only as told by your health care provider. Do not stop using your medicine even if you start to feel better. Contact a health care provider if your symptoms do not lessen with medicine. This information is not intended to replace advice given to you by your health care provider. Make sure you discuss any questions you have with your health care provider. Document Revised: 01/16/2022 Document Reviewed: 06/13/2019 Elsevier Patient Education  [place]. Pharyngitis  Pharyngitis is inflammation of the throat (pharynx). It is a very  common cause of sore throat. Pharyngitis can be caused by a bacteria, but it is usually caused by a virus. Most cases of pharyngitis get better on their own without treatment. What are the causes? This condition may be caused by: Infection by viruses (viral). Viral pharyngitis spreads easily from person to person (is contagious) through coughing, sneezing, and sharing of personal items or utensils such as  cups, forks, spoons, and toothbrushes. Infection by bacteria (bacterial). Bacterial pharyngitis may be spread by touching the nose or face after coming in contact with the bacteria, or through close contact, such as kissing. Allergies. Allergies can cause buildup of mucus in the throat (post-nasal drip), leading to inflammation and irritation. Allergies can also cause blocked nasal passages, forcing breathing through the mouth, which dries and irritates the throat. What increases the risk? You are more likely to develop this condition if: You are 58-18 years old. You are exposed to crowded environments such as daycare, school, or dormitory living. You live in a cold climate. You have a weakened disease-fighting (immune) system. What are the signs or symptoms? Symptoms of this condition vary by the cause. Common symptoms of this condition include: Sore throat. Fatigue. Low-grade fever. Stuffy nose (nasal congestion) and cough. Headache. Other symptoms may include: Glands in the neck (lymph nodes) that are swollen. Skin rashes. Plaque-like film on the throat or tonsils. This is often a symptom of bacterial pharyngitis. Vomiting. Red, itchy eyes (conjunctivitis). Loss of appetite. Joint pain and muscle aches. Enlarged tonsils. How is this diagnosed? This condition may be diagnosed based on your medical history and a physical exam. Your health care provider will ask you questions about your illness and your symptoms. A swab of your throat may be done to check for bacteria (rapid strep test). Other lab tests may also be done, depending on the suspected cause, but these are rare. How is this treated? Many times, treatment is not needed for this condition. Pharyngitis usually gets better in 3-4 days without treatment. Bacterial pharyngitis may be treated with antibiotic medicines. Follow these instructions at home: Medicines Take over-the-counter and prescription medicines only as told by  your health care provider. If you were prescribed an antibiotic medicine, take it as told by your health care provider. Do not stop taking the antibiotic even if you start to feel better. Use throat sprays to soothe your throat as told by your health care provider. Children can get pharyngitis. Do not give your child aspirin because of the association with Reye's syndrome. Managing pain To help with pain, try: Sipping warm liquids, such as broth, herbal tea, or warm water. Eating or drinking cold or frozen liquids, such as frozen ice pops. Gargling with a mixture of salt and water 3-4 times a day or as needed. To make salt water, completely dissolve -1 tsp (3-6 g) of salt in 1 cup (237 mL) of warm water. Sucking on hard candy or throat lozenges. Putting a cool-mist humidifier in your bedroom at night to moisten the air. Sitting in the bathroom with the door closed for 5-10 minutes while you run hot water in the shower.  General instructions  Do not use any products that contain nicotine or tobacco. These products include cigarettes, chewing tobacco, and vaping devices, such as e-cigarettes. If you need help quitting, ask your health care provider. Rest as told by your health care provider. Drink enough fluid to keep your urine pale yellow. How is this prevented? To help prevent becoming infected or spreading infection:  Wash your hands often with soap and water for at least 20 seconds. If soap and water are not available, use hand sanitizer. Do not touch your eyes, nose, or mouth with unwashed hands, and wash hands after touching these areas. Do not share cups or eating utensils. Avoid close contact with people who are sick. Contact a health care provider if: You have large, tender lumps in your neck. You have a rash. You cough up green, yellow-brown, or bloody mucus. Get help right away if: Your neck becomes stiff. You drool or are unable to swallow liquids. You cannot drink or take  medicines without vomiting. You have severe pain that does not go away, even after you take medicine. You have trouble breathing, and it is not caused by a stuffy nose. You have new pain and swelling in your joints such as the knees, ankles, wrists, or elbows. These symptoms may represent a serious problem that is an emergency. Do not wait to see if the symptoms will go away. Get medical help right away. Call your local emergency services (911 in the [place] Do not drive yourself to the hospital. Summary Pharyngitis is redness, pain, and swelling (inflammation) of the throat (pharynx). While pharyngitis can be caused by a bacteria, the most common causes are viral. Most cases of pharyngitis get better on their own without treatment. Bacterial pharyngitis is treated with antibiotic medicines. This information is not intended to replace advice given to you by your health care provider. Make sure you discuss any questions you have with your health care provider. Document Revised: 10/31/2020 Document Reviewed: 10/31/2020 Elsevier Patient Education  [place]. How to Perform a Sinus Rinse A sinus rinse is a home treatment that is used to rinse your sinuses with a germ-free (sterile) mixture of salt and water (saline solution). Sinuses are air-filled spaces in your skull that are behind the bones of your face and forehead. They open into your nasal cavity. A sinus rinse can help to clear mucus, dirt, dust, or pollen from your nasal cavity. You may do a sinus rinse when you have a cold, a virus, nasal allergy symptoms, a sinus infection, or stuffiness in your nose or sinuses. What are the risks? A sinus rinse is generally safe and effective. However, there are a few risks, which include: A burning sensation in your sinuses. This may happen if you do not make the saline solution as directed. Be sure to follow all directions when making the saline solution. Nasal irritation. Infection. This may be  from unclean supplies or from contaminated water. Infection from contaminated water is rare, but possible. Do not do a sinus rinse if you have had ear or nasal surgery, ear infection, or plugged ears, unless recommended by your health care provider. Supplies needed: Saline solution or powder. Distilled or sterile water to mix with saline powder. You may use boiled and cooled tap water. Boil tap water for 5 minutes; cool until it is lukewarm. Use within 24 hours. Do not use regular tap water to mix with the saline solution. Neti pot or nasal rinse bottle. These supplies release the saline solution into your nose and through your sinuses. Neti pots and nasal rinse bottles can be purchased at [employer], a health food store, or online. How to perform a sinus rinse  Wash your hands with soap and water for at least 20 seconds. If soap and water are not available, use hand sanitizer. Wash your device according to the directions that  came with the product and then dry it. Use the solution that comes with your product or one that is sold separately in stores. Follow the mixing directions on the package to mix with sterile or distilled water. Fill the device with the amount of saline solution noted in the device instructions. Stand by a sink and tilt your head sideways over the sink. Place the spout of the device in your upper nostril (the one closer to the ceiling). Gently pour or squeeze the saline solution into your nasal cavity. The liquid should drain out from the lower nostril if you are not too congested. While rinsing, breathe through your open mouth. Gently blow your nose to clear any mucus and rinse solution. Blowing too hard may cause ear pain. Turn your head in the other direction and repeat in your other nostril. Clean and rinse your device with clean water and then air-dry it. Talk with your health care provider or pharmacist if you have questions about how to do a sinus  rinse. Summary A sinus rinse is a home treatment that is used to rinse your sinuses with a sterile mixture of salt and water (saline solution). You may do a sinus rinse when you have a cold, a virus, nasal allergy symptoms, a sinus infection, or stuffiness in your nose or sinuses. A sinus rinse is generally safe and effective. Follow all instructions carefully. This information is not intended to replace advice given to you by your health care provider. Make sure you discuss any questions you have with your health care provider. Document Revised: 01/21/2021 Document Reviewed: 01/21/2021 Elsevier Patient Education  [place].

## 2022-07-01 NOTE — Progress Notes (Signed)
Subjective:    Patient ID: Grant Wilson, male    DOB: 04-15-1966, 56 y.o.   MRN: [id number]  56y/o african [place] male established patient has not been taking his blood pressure medication daily.  Drank hot fluids 2 weeks ago and throat still sore and with clearing throat some blood noted in mucous this week.  Has noted post nasal drip.  Not using nasal saline or oral antihistamine at this time.  Denied heartburn symptoms at this time but has had in the past.      Review of Systems  Constitutional:  Negative for activity change, appetite change, chills, diaphoresis, fatigue and fever.  HENT:  Positive for congestion, postnasal drip, rhinorrhea and sore throat. Negative for dental problem, drooling, ear discharge, ear pain, facial swelling, hearing loss, mouth sores, nosebleeds, sinus pressure, sinus pain, sneezing, tinnitus, trouble swallowing and voice change.   Eyes:  Negative for photophobia and visual disturbance.  Respiratory:  Negative for cough, choking, chest tightness, shortness of breath, wheezing and stridor.   Cardiovascular:  Negative for chest pain and palpitations.  Gastrointestinal:  Negative for abdominal distention, abdominal pain, diarrhea, nausea and vomiting.  Endocrine: Negative for cold intolerance and heat intolerance.  Genitourinary:  Negative for difficulty urinating.  Musculoskeletal:  Negative for gait problem, neck pain and neck stiffness.  Skin:  Negative for rash.  Allergic/Immunologic: Positive for environmental allergies. Negative for food allergies.  Neurological:  Negative for dizziness, tremors, seizures, syncope, facial asymmetry, speech difficulty, weakness, light-headedness, numbness and headaches.  Hematological:  Negative for adenopathy. Does not bruise/bleed easily.  Psychiatric/Behavioral:  Negative for agitation, confusion and sleep disturbance.        Objective:   Physical Exam Vitals and nursing note reviewed.  Constitutional:       General: He is awake. He is not in acute distress.    Appearance: Normal appearance. He is well-developed, well-groomed and normal weight. He is not ill-appearing, toxic-appearing or diaphoretic.  HENT:     Head: Normocephalic and atraumatic.     Jaw: There is normal jaw occlusion.     Salivary Glands: Right salivary gland is not diffusely enlarged or tender. Left salivary gland is not diffusely enlarged or tender.     Right Ear: Hearing, ear canal and external ear normal. No decreased hearing noted. No laceration, drainage, swelling or tenderness. A middle ear effusion is present. There is no impacted cerumen. No foreign body. No mastoid tenderness. No PE tube. No hemotympanum. Tympanic membrane is not injected, scarred, perforated, erythematous or retracted.     Left Ear: Hearing, ear canal and external ear normal. No decreased hearing noted. No laceration, drainage, swelling or tenderness. A middle ear effusion is present. There is no impacted cerumen. No foreign body. No mastoid tenderness. No PE tube. No hemotympanum. Tympanic membrane is not injected, scarred, perforated, erythematous or retracted.     Ears:     Comments: Air fluid level bilateral TMs intact; left 50% opacity right 25% opacity    Nose: Mucosal edema present. No signs of injury, laceration, nasal tenderness, congestion or rhinorrhea.     Right Turbinates: Enlarged and swollen. Not pale.     Left Turbinates: Enlarged and swollen. Not pale.     Right Sinus: No maxillary sinus tenderness or frontal sinus tenderness.     Left Sinus: No maxillary sinus tenderness or frontal sinus tenderness.     Mouth/Throat:     Lips: Pink. No lesions.     Mouth: Mucous membranes are  moist. No oral lesions or angioedema.     Dentition: No gum lesions.     Tongue: No lesions. Tongue does not deviate from midline.     Palate: No mass and lesions.     Pharynx: Uvula midline. Pharyngeal swelling and posterior oropharyngeal erythema present. No  oropharyngeal exudate.     Tonsils: No tonsillar exudate.     Comments: Cobblestoning posterior pharynx; bilateral allergic shiners; bilateral nares crusted mucous clear yellow; nasal turbinates edema/erythema Eyes:     General: Lids are normal. Vision grossly intact. Gaze aligned appropriately. Allergic shiner present. No scleral icterus.       Right eye: No discharge.        Left eye: No discharge.     Extraocular Movements: Extraocular movements intact.     Conjunctiva/sclera: Conjunctivae normal.     Pupils: Pupils are equal, round, and reactive to light.  Neck:     Trachea: Trachea and phonation normal. No tracheal tenderness, abnormal tracheal secretions or tracheal deviation.  Cardiovascular:     Rate and Rhythm: Normal rate and regular rhythm.     Pulses: Normal pulses.          Radial pulses are 2+ on the right side and 2+ on the left side.     Heart sounds: Normal heart sounds, S1 normal and S2 normal.  Pulmonary:     Effort: Pulmonary effort is normal. No respiratory distress.     Breath sounds: Normal breath sounds and air entry. No stridor, decreased air movement or transmitted upper airway sounds. No decreased breath sounds, wheezing, rhonchi or rales.     Comments: Spoke full sentences without difficulty; no cough observed in exam room Abdominal:     General: Abdomen is flat.  Musculoskeletal:        General: Normal range of motion.     Right hand: Normal strength. Normal capillary refill.     Left hand: Normal strength. Normal capillary refill.     Cervical back: Normal range of motion and neck supple. No swelling, edema, deformity, erythema, signs of trauma, lacerations, rigidity, torticollis, tenderness or crepitus. No pain with movement. Normal range of motion.     Thoracic back: No swelling, edema, deformity, signs of trauma or lacerations.     Right lower leg: No edema.     Left lower leg: No edema.  Lymphadenopathy:     Head:     Right side of head: No  submandibular or preauricular adenopathy.     Left side of head: No submandibular or preauricular adenopathy.     Cervical: No cervical adenopathy.     Right cervical: No superficial, deep or posterior cervical adenopathy.    Left cervical: No superficial, deep or posterior cervical adenopathy.  Skin:    General: Skin is warm and dry.     Capillary Refill: Capillary refill takes less than 2 seconds.     Coloration: Skin is not ashen, cyanotic, jaundiced, mottled, pale or sallow.     Findings: No abrasion, abscess, acne, bruising, burn, ecchymosis, erythema, signs of injury, laceration, lesion, petechiae, rash or wound.     Nails: There is no clubbing.  Neurological:     General: No focal deficit present.     Mental Status: He is alert and oriented to person, place, and time. Mental status is at baseline.     GCS: GCS eye subscore is 4. GCS verbal subscore is 5. GCS motor subscore is 6.     Cranial Nerves: Cranial  nerves 2-12 are intact. No cranial nerve deficit, dysarthria or facial asymmetry.     Sensory: Sensation is intact.     Motor: Motor function is intact. No weakness, tremor, atrophy, abnormal muscle tone or seizure activity.     Coordination: Coordination is intact. Coordination normal.     Gait: Gait is intact. Gait normal.     Comments: In/out of chair and on/off exam table without difficulty; gait sure and steady in clinic; bilateral hand grasp equal 5/5  Psychiatric:        Attention and Perception: Attention and perception normal.        Mood and Affect: Mood and affect normal.        Speech: Speech normal.        Behavior: Behavior normal. Behavior is cooperative.        Thought Content: Thought content normal.        Cognition and Memory: Cognition and memory normal.        Judgment: Judgment normal.           Assessment & Plan:   A-acute rhinitis, acute pharyngitis  P-Patient may use normal saline nasal spray 2 sprays each nostril q2h wa as needed. Given 1  bottle from clinic stock.   flonase 32mg 1 spray each nostril BID #1 RF0.  Patient denied personal or family history of ENT cancer.  OTC antihistamine of choice claritin/zyrtec '10mg'$  po daily.  Given 3 claritin from clinic stock to use and follow up re-evaluation Thursday 07/03/22.  If no improvement will start omeprazole DR '20mg'$  po daily x 2 weeks #90 RF0 from [facility]. Discussed may have silent GERD and esophagus hasn't been able to heal after initial heat trauma.  Sore throat may be post nasal drip irritation on top of burn from hot fluids.  Avoid triggers if possible.  Shower prior to bedtime if exposed to triggers.  If allergic dust/dust mites recommend mattress/pillow covers/encasements; washing linens, vacuuming, sweeping, dusting weekly.  Call or return to clinic as needed if these symptoms worsen or fail to improve as anticipated.   Exitcare handouts on acute pharyngitis, allergic rhinitis and sinus rinse.  Patient verbalized understanding of instructions, agreed with plan of care and had no further questions at this time.  P2:  Avoidance and hand washing.

## 2022-07-03 ENCOUNTER — Ambulatory Visit: Payer: No Typology Code available for payment source | Admitting: Registered Nurse

## 2022-07-03 ENCOUNTER — Encounter: Payer: Self-pay | Admitting: Registered Nurse

## 2022-07-03 VITALS — BP 144/82 | HR 79 | Temp 97.2°F

## 2022-07-03 DIAGNOSIS — J00 Acute nasopharyngitis [common cold]: Secondary | ICD-10-CM

## 2022-07-03 DIAGNOSIS — I10 Essential (primary) hypertension: Secondary | ICD-10-CM

## 2022-07-03 MED ORDER — LORATADINE 10 MG PO TABS: 10.0000 mg | ORAL_TABLET | Freq: Every day | ORAL | 11 refills | Status: DC

## 2022-07-03 MED ORDER — SALINE SPRAY 0.65 % NA SOLN: 2.0000 | NASAL | 0 refills | Status: DC

## 2022-07-03 NOTE — Progress Notes (Signed)
Subjective:    Patient ID: Grant Wilson, male    DOB: 1966-03-11, 56 y.o.   MRN: [id number]  56y/o african [place] male established here for re-evaluation sore throat.  Started loratadine 48 hours ago and it has helped a great deal per patient.  Stated 0/10 throat pain in clinic at time of appt today. And no longer feeling dry or painful when he wakes up in the am.      Review of Systems  Constitutional:  Negative for chills, diaphoresis and fever.  HENT:  Positive for congestion, postnasal drip, rhinorrhea and sore throat. Negative for ear discharge, ear pain, facial swelling, hearing loss, mouth sores, nosebleeds, sinus pressure, sinus pain, sneezing, trouble swallowing and voice change.   Eyes:  Negative for photophobia and visual disturbance.  Respiratory:  Negative for cough, shortness of breath, wheezing and stridor.   Cardiovascular:  Negative for chest pain and palpitations.  Gastrointestinal:  Negative for diarrhea, nausea and vomiting.  Genitourinary:  Negative for difficulty urinating.  Musculoskeletal:  Negative for back pain and myalgias.  Skin:  Negative for rash.  Allergic/Immunologic: Positive for environmental allergies. Negative for food allergies.  Neurological:  Negative for dizziness, tremors, seizures, syncope, facial asymmetry, speech difficulty, light-headedness, numbness and headaches.  Hematological:  Negative for adenopathy.  Psychiatric/Behavioral:  Negative for agitation, confusion and sleep disturbance.        Objective:   Physical Exam Vitals and nursing note reviewed.  Constitutional:      General: He is not in acute distress.    Appearance: Normal appearance. He is well-developed, well-groomed and normal weight. He is ill-appearing. He is not toxic-appearing or diaphoretic.  HENT:     Head: Normocephalic and atraumatic.     Jaw: There is normal jaw occlusion. No trismus.     Salivary Glands: Right salivary gland is not diffusely enlarged.  Left salivary gland is not diffusely enlarged.     Right Ear: Hearing, ear canal and external ear normal. No decreased hearing noted. No laceration, drainage, swelling or tenderness. A middle ear effusion is present. There is no impacted cerumen. No foreign body. No mastoid tenderness. No hemotympanum. Tympanic membrane is not injected, scarred, perforated or erythematous.     Left Ear: Hearing, ear canal and external ear normal. No decreased hearing noted. No laceration, drainage, swelling or tenderness. A middle ear effusion is present. There is no impacted cerumen. No foreign body. No mastoid tenderness. No hemotympanum. Tympanic membrane is not injected, scarred, perforated or erythematous.     Nose: Mucosal edema and rhinorrhea present. No nasal deformity, septal deviation, laceration or congestion. Rhinorrhea is clear.     Right Turbinates: Enlarged. Not swollen or pale.     Left Turbinates: Enlarged. Not swollen or pale.     Right Sinus: No maxillary sinus tenderness or frontal sinus tenderness.     Left Sinus: No maxillary sinus tenderness or frontal sinus tenderness.     Mouth/Throat:     Lips: Pink. No lesions.     Mouth: Mucous membranes are moist. Mucous membranes are not pale, not dry and not cyanotic. No lacerations, oral lesions or angioedema.     Dentition: No dental abscesses or gum lesions.     Tongue: No lesions. Tongue does not deviate from midline.     Palate: No mass and lesions.     Pharynx: Uvula midline. Pharyngeal swelling and posterior oropharyngeal erythema present. No oropharyngeal exudate or uvula swelling.     Tonsils: No tonsillar exudate  or tonsillar abscesses. 0 on the right. 0 on the left.     Comments: Cobblestoning posterior pharynx; billateral TMs air fluid level clear; bilateral allergic shiners; clear dried mucous scattered yellow bilateral nares Eyes:     General: Lids are normal. Vision grossly intact. Gaze aligned appropriately. Allergic shiner present. No  scleral icterus.       Right eye: No foreign body, discharge or hordeolum.        Left eye: No foreign body, discharge or hordeolum.     Extraocular Movements: Extraocular movements intact.     Right eye: Normal extraocular motion and no nystagmus.     Left eye: Normal extraocular motion and no nystagmus.     Conjunctiva/sclera: Conjunctivae normal.     Right eye: Right conjunctiva is not injected. No chemosis, exudate or hemorrhage.    Left eye: Left conjunctiva is not injected. No chemosis, exudate or hemorrhage.    Pupils: Pupils are equal, round, and reactive to light. Pupils are equal.     Right eye: Pupil is round and reactive.     Left eye: Pupil is round and reactive.  Neck:     Thyroid: No thyroid mass or thyromegaly.     Trachea: Trachea and phonation normal. No tracheal tenderness or tracheal deviation.  Cardiovascular:     Rate and Rhythm: Normal rate and regular rhythm.     Pulses:          Radial pulses are 2+ on the right side and 2+ on the left side.     Heart sounds: Normal heart sounds. No murmur heard.    No friction rub. No gallop.  Pulmonary:     Effort: Pulmonary effort is normal. No respiratory distress.     Breath sounds: Normal breath sounds and air entry. No stridor. No decreased breath sounds, wheezing, rhonchi or rales.     Comments: Spoke full sentences without difficulty; no cough observed in exam room Abdominal:     General: There is no distension.     Palpations: Abdomen is soft.  Musculoskeletal:        General: No tenderness. Normal range of motion.     Cervical back: Normal range of motion and neck supple. No edema, erythema, signs of trauma, rigidity, torticollis or crepitus. No pain with movement, spinous process tenderness or muscular tenderness. Normal range of motion.     Right lower leg: No edema.     Left lower leg: No edema.  Lymphadenopathy:     Head:     Right side of head: No submental, submandibular, tonsillar, preauricular, posterior  auricular or occipital adenopathy.     Left side of head: No submental, submandibular, tonsillar, preauricular, posterior auricular or occipital adenopathy.     Cervical: No cervical adenopathy.     Right cervical: No superficial, deep or posterior cervical adenopathy.    Left cervical: No superficial, deep or posterior cervical adenopathy.  Skin:    General: Skin is warm and dry.     Capillary Refill: Capillary refill takes less than 2 seconds.     Coloration: Skin is not ashen, cyanotic, jaundiced, mottled, pale or sallow.     Findings: No abrasion, abscess, acne, bruising, burn, ecchymosis, erythema, signs of injury, laceration, lesion, petechiae, rash or wound.     Nails: There is no clubbing.  Neurological:     General: No focal deficit present.     Mental Status: He is alert and oriented to person, place, and time. Mental status  is at baseline.     GCS: GCS eye subscore is 4. GCS verbal subscore is 5. GCS motor subscore is 6.     Cranial Nerves: Cranial nerves 2-12 are intact. No cranial nerve deficit, dysarthria or facial asymmetry.     Sensory: No sensory deficit.     Motor: Motor function is intact. No weakness, tremor, atrophy, abnormal muscle tone or seizure activity.     Coordination: Coordination is intact. Coordination normal.     Gait: Gait is intact. Gait normal.     Comments: Gait sure and steady in clinic; in/out of chair and on/off exam table without difficulty; bilateral hand grasp equal  Psychiatric:        Attention and Perception: Attention and perception normal.        Mood and Affect: Mood and affect normal.        Speech: Speech normal.        Behavior: Behavior normal. Behavior is cooperative.        Thought Content: Thought content normal.        Cognition and Memory: Cognition and memory normal.        Judgment: Judgment normal.           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  Discussed to continue nasal saline 2 sprays each nostril q2h prn congestion/dryness.  Exitcare handouts sinus rinse/allergic rhinitis.  Follow up re-evaluation if new or worsening symptoms  Patient refused printed AVS..  Patient verbalized understanding information/instructions, agreed with plan of care and had no further questions at this time.  Patient stated stressful day at work due to repetitive power outages today works in [employer], technical sales.  Encouraged patient to take his hydrochlorothiazide daily and avoid high salt diet.  Drink water to keep urine clear pale yellow.  Patient verbalized understanding information/instructions and had no further questions at this time.

## 2022-07-03 NOTE — Patient Instructions (Signed)
How to Perform a Sinus Rinse A sinus rinse is a home treatment that is used to rinse your sinuses with a germ-free (sterile) mixture of salt and water (saline solution). Sinuses are air-filled spaces in your skull that are behind the bones of your face and forehead. They open into your nasal cavity. A sinus rinse can help to clear mucus, dirt, dust, or pollen from your nasal cavity. You may do a sinus rinse when you have a cold, a virus, nasal allergy symptoms, a sinus infection, or stuffiness in your nose or sinuses. What are the risks? A sinus rinse is generally safe and effective. However, there are a few risks, which include: A burning sensation in your sinuses. This may happen if you do not make the saline solution as directed. Be sure to follow all directions when making the saline solution. Nasal irritation. Infection. This may be from unclean supplies or from contaminated water. Infection from contaminated water is rare, but possible. Do not do a sinus rinse if you have had ear or nasal surgery, ear infection, or plugged ears, unless recommended by your health care provider. Supplies needed: Saline solution or powder. Distilled or sterile water to mix with saline powder. You may use boiled and cooled tap water. Boil tap water for 5 minutes; cool until it is lukewarm. Use within 24 hours. Do not use regular tap water to mix with the saline solution. Neti pot or nasal rinse bottle. These supplies release the saline solution into your nose and through your sinuses. Neti pots and nasal rinse bottles can be purchased at [employer], a health food store, or online. How to perform a sinus rinse  Wash your hands with soap and water for at least 20 seconds. If soap and water are not available, use hand sanitizer. Wash your device according to the directions that came with the product and then dry it. Use the solution that comes with your product or one that is sold separately in stores.  Follow the mixing directions on the package to mix with sterile or distilled water. Fill the device with the amount of saline solution noted in the device instructions. Stand by a sink and tilt your head sideways over the sink. Place the spout of the device in your upper nostril (the one closer to the ceiling). Gently pour or squeeze the saline solution into your nasal cavity. The liquid should drain out from the lower nostril if you are not too congested. While rinsing, breathe through your open mouth. Gently blow your nose to clear any mucus and rinse solution. Blowing too hard may cause ear pain. Turn your head in the other direction and repeat in your other nostril. Clean and rinse your device with clean water and then air-dry it. Talk with your health care provider or pharmacist if you have questions about how to do a sinus rinse. Summary A sinus rinse is a home treatment that is used to rinse your sinuses with a sterile mixture of salt and water (saline solution). You may do a sinus rinse when you have a cold, a virus, nasal allergy symptoms, a sinus infection, or stuffiness in your nose or sinuses. A sinus rinse is generally safe and effective. Follow all instructions carefully. This information is not intended to replace advice given to you by your health care provider. Make sure you discuss any questions you have with your health care provider. Document Revised: 01/21/2021 Document Reviewed: 01/21/2021 Else[employer] Patient Education  [place]. Allergic  Rhinitis, Adult  Allergic rhinitis is an allergic reaction that affects the mucous membrane inside the nose. The mucous membrane is the tissue that produces mucus. There are two types of allergic rhinitis: Seasonal. This type is also called hay fever and happens only during certain seasons. Perennial. This type can happen at any time of the year. Allergic rhinitis cannot be spread from person to person. This condition can be mild,  moderate, or severe. It can develop at any age and may be outgrown. What are the causes? This condition is caused by allergens. These are things that can cause an allergic reaction. Allergens may differ for seasonal allergic rhinitis and perennial allergic rhinitis. Seasonal allergic rhinitis is triggered by pollen. Pollen can come from grasses, trees, and weeds. Perennial allergic rhinitis may be triggered by: Dust mites. Proteins in a pet's urine, saliva, or dander. Dander is dead skin cells from a pet. Smoke, mold, or car fumes. What increases the risk? You are more likely to develop this condition if you have a family history of allergies or other conditions related to allergies, including: Allergic conjunctivitis. This is inflammation of parts of the eyes and eyelids. Asthma. This condition affects the lungs and makes it hard to breathe. Atopic dermatitis or eczema. This is long term (chronic) inflammation of the skin. Food allergies. What are the signs or symptoms? Symptoms of this condition include: Sneezing or coughing. A stuffy nose (nasal congestion), itchy nose, or nasal discharge. Itchy eyes and tearing of the eyes. A feeling of mucus dripping down the back of your throat (postnasal drip). Trouble sleeping. Tiredness or fatigue. Headache. Sore throat. How is this diagnosed? This condition may be diagnosed with your symptoms, medical history, and physical exam. Your health care provider may check for related conditions, such as: Asthma. Pink eye. This is eye inflammation caused by infection (conjunctivitis). Ear infection. Upper respiratory infection. This is an infection in the nose, throat, or upper airways. You may also have tests to find out which allergens trigger your symptoms. These may include skin tests or blood tests. How is this treated? There is no cure for this condition, but treatment can help control symptoms. Treatment may include: Taking medicines that  block allergy symptoms, such as corticosteroids and antihistamines. Medicine may be given as a shot, nasal spray, or pill. Avoiding any allergens. Being exposed again and again to tiny amounts of allergens to help you build a defense against allergens (immunotherapy). This is done if other treatments have not helped. It may include: Allergy shots. These are injected medicines that have small amounts of allergen in them. Sublingual immunotherapy. This involves taking small doses of a medicine with allergen in it under your tongue. If these treatments do not work, your health care provider may prescribe newer, stronger medicines. Follow these instructions at home: Avoiding allergens Find out what you are allergic to and avoid those allergens. These are some things you can do to help avoid allergens: If you have perennial allergies: Replace carpet with wood, tile, or vinyl flooring. Carpet can trap dander and dust. Do not smoke. Do not allow smoking in your home. Change your heating and air conditioning filters at least once a month. If you have seasonal allergies, take these steps during allergy season: Keep windows closed as much as possible. Plan outdoor activities when pollen counts are lowest. Check pollen counts before you plan outdoor activities. When coming indoors, change clothing and shower before sitting on furniture or bedding. If you have a pet  in the house that produces allergens: Keep the pet out of the bedroom. Vacuum, sweep, and dust regularly. General instructions Take over-the-counter and prescription medicines only as told by your health care provider. Drink enough fluid to keep your urine pale yellow. Keep all follow-up visits as told by your health care provider. This is important. Where to find more information American Academy of [employer], Asthma & Immunology: www.aaaai.org Contact a health care provider if: You have a fever. You develop a cough that does not go  away. You make whistling sounds when you breathe (wheeze). Your symptoms slow you down or stop you from doing your normal activities each day. Get help right away if: You have shortness of breath. This symptom may represent a serious problem that is an emergency. Do not wait to see if the symptom will go away. Get medical help right away. Call your local emergency services (911 in the [place] Do not drive yourself to the hospital. Summary Allergic rhinitis may be managed by taking medicines as directed and avoiding allergens. If you have seasonal allergies, keep windows closed as much as possible during allergy season. Contact your health care provider if you develop a fever or a cough that does not go away. This information is not intended to replace advice given to you by your health care provider. Make sure you discuss any questions you have with your health care provider. Document Revised: 01/16/2022 Document Reviewed: 08/02/2019 Elsevier Patient Education  [place].

## 2022-07-22 ENCOUNTER — Ambulatory Visit: Payer: No Typology Code available for payment source | Admitting: Registered Nurse

## 2022-07-22 VITALS — BP 158/79 | HR 89 | Temp 97.7°F

## 2022-07-22 DIAGNOSIS — J029 Acute pharyngitis, unspecified: Secondary | ICD-10-CM

## 2022-07-22 DIAGNOSIS — I10 Essential (primary) hypertension: Secondary | ICD-10-CM

## 2022-07-22 DIAGNOSIS — J00 Acute nasopharyngitis [common cold]: Secondary | ICD-10-CM

## 2022-07-22 MED ORDER — OMEPRAZOLE 20 MG PO CPDR: 20.0000 mg | DELAYED_RELEASE_CAPSULE | Freq: Every day | ORAL | 0 refills | Status: DC

## 2022-07-22 NOTE — Progress Notes (Unsigned)
Covid test negative

## 2022-07-23 NOTE — Patient Instructions (Signed)
Nonallergic Rhinitis Nonallergic rhinitis is inflammation of the mucous membrane inside the nose. The mucous membrane is the tissue that produces mucus. This condition is different from having allergic rhinitis, which is an allergy that affects the nose. Allergic rhinitis occurs when the body's defense system, or immune system, reacts to a substance that a person is allergic to (allergen), such as pollen, pet dander, mold, or dust. Nonallergic rhinitis has many similar symptoms, but it is not caused by allergens. Nonallergic rhinitis can be an acute or chronic problem. This means it can be short-term or long-term. What are the causes? This condition may be caused by many different things. Some common types of nonallergic rhinitis include: Infectious rhinitis. This is usually caused by an infection in the nose, throat, or upper airways (upper respiratory system). Vasomotor rhinitis. This is the most common type of chronic nonallergic rhinitis. It is caused by too much blood flow through your nose, and it leads to swelling in your nose. It is triggered by strong odors, cold air, stress, drinking alcohol, cigarette smoke, or changes in the weather. Occupational rhinitis. This type is caused by triggers in the workplace, such as chemicals, dust, animal dander, or air pollution. Hormonal rhinitis, in teenage girls and women. This type is caused by an increase in the hormone estrogen and may happen during pregnancy, puberty, or monthly menstrual periods. Hormonal rhinitis gives you fewer symptoms when estrogen levels drop. Drug-induced rhinitis. Several types of medicines can cause this, such as medicines for high blood pressure or heart disease, aspirin, or NSAIDs. Nonallergic rhinitis with eosinophilia syndrome (NARES). This type is caused by having too much eosinophil, a type of white blood cell. Other causes include a reaction to eating hot or spicy foods. This does not usually cause long-term symptoms. In  some cases, the cause of nonallergic rhinitis is not known. What increases the risk? You are more likely to develop this condition if: You are 16-41 years of age. You are a woman. Women are twice as likely to have this condition. What are the signs or symptoms? Common symptoms of this condition include: Stuffy nose (nasal congestion). Runny nose. A feeling of mucus dripping down the back of your throat (postnasal drip). Trouble sleeping. Tiredness, or fatigue. Other symptoms include: Sneezing. Coughing. Itchy nose. Bloodshot eyes. How is this diagnosed? This type may be diagnosed based on: Your symptoms and medical history. A physical exam. Allergy testing to rule out allergic rhinitis. You may have skin tests or blood tests. Your health care provider may also take a swab of nasal discharge to look for an increased number of eosinophils. This would be done to confirm a diagnosis of NARES. How is this treated? Treatment for this condition depends on the cause. No single treatment works for everyone. Work with your health care provider to find the best treatment for you. Treatment may include: Avoiding the things that trigger your symptoms. Medicines to relieve congestion, such as: Steroid nasal spray. There are many types. You may need to try a few to find out which one works best. Best boy medicine. This treats nasal congestion and may be given by mouth or as a nasal spray. These medicines are used only for a short time. Medicines to relieve a runny nose. These may include antihistamine medicines or anticholinergic nasal sprays. Nasal irrigation. This involves using a salt-water (saline) spray or saline container called a neti pot. Nasal irrigation helps to clear away mucus and keep your nasal passages moist. Surgery to remove part  of your mucous membrane. This is done in severe cases if the condition has not improved after 6-12 months of treatment. Follow these instructions at  home: Medicines Take or use over-the-counter and prescription medicines only as told by your health care provider. Do not stop using your medicine even if you start to feel better. Do not take NSAIDs, such as ibuprofen, or medicines that contain aspirin if they make your symptoms worse. Lifestyle Do not drink alcohol if it makes your symptoms worse. Do not use any products that contain nicotine or tobacco, such as cigarettes, e-cigarettes, and chewing tobacco. If you need help quitting, ask your health care provider. Avoid secondhand smoke. General instructions Avoid triggers that make your symptoms worse. Use nasal irrigation as told by your health care provider. Get exercise. Exercise may help reduce symptoms for some people. Sleep with the head of your bed raised. This may reduce nasal congestion when you sleep. Drink enough fluid to keep your urine pale yellow. Keep all follow-up visits as told by your health care provider. This is important. Contact a health care provider if: You have a fever. Your symptoms are getting worse at home. Your symptoms do not lessen with medicine. You develop new symptoms, especially a headache or nosebleed. Summary Nonallergic rhinitis is inflammation inside the nose that is not caused by allergens. Nonallergic rhinitis can be a short-term or long-term problem. Treatment may include avoiding the things that trigger your symptoms. Take or use over-the-counter and prescription medicines only as told by your health care provider. Do not stop using your medicine even if you start to feel better. Contact a health care provider if your symptoms do not lessen with medicine. This information is not intended to replace advice given to you by your health care provider. Make sure you discuss any questions you have with your health care provider. Document Revised: 01/16/2022 Document Reviewed: 06/13/2019 Elsevier Patient Education  [place]. Pharyngitis  Pharyngitis is inflammation of the throat (pharynx). It is a very common cause of sore throat. Pharyngitis can be caused by a bacteria, but it is usually caused by a virus. Most cases of pharyngitis get better on their own without treatment. What are the causes? This condition may be caused by: Infection by viruses (viral). Viral pharyngitis spreads easily from person to person (is contagious) through coughing, sneezing, and sharing of personal items or utensils such as cups, forks, spoons, and toothbrushes. Infection by bacteria (bacterial). Bacterial pharyngitis may be spread by touching the nose or face after coming in contact with the bacteria, or through close contact, such as kissing. Allergies. Allergies can cause buildup of mucus in the throat (post-nasal drip), leading to inflammation and irritation. Allergies can also cause blocked nasal passages, forcing breathing through the mouth, which dries and irritates the throat. What increases the risk? You are more likely to develop this condition if: You are 60-23 years old. You are exposed to crowded environments such as daycare, school, or dormitory living. You live in a cold climate. You have a weakened disease-fighting (immune) system. What are the signs or symptoms? Symptoms of this condition vary by the cause. Common symptoms of this condition include: Sore throat. Fatigue. Low-grade fever. Stuffy nose (nasal congestion) and cough. Headache. Other symptoms may include: Glands in the neck (lymph nodes) that are swollen. Skin rashes. Plaque-like film on the throat or tonsils. This is often a symptom of bacterial pharyngitis. Vomiting. Red, itchy eyes (conjunctivitis). Loss of appetite. Joint pain and muscle  aches. Enlarged tonsils. How is this diagnosed? This condition may be diagnosed based on your medical history and a physical exam. Your health care provider will ask you questions about your illness and  your symptoms. A swab of your throat may be done to check for bacteria (rapid strep test). Other lab tests may also be done, depending on the suspected cause, but these are rare. How is this treated? Many times, treatment is not needed for this condition. Pharyngitis usually gets better in 3-4 days without treatment. Bacterial pharyngitis may be treated with antibiotic medicines. Follow these instructions at home: Medicines Take over-the-counter and prescription medicines only as told by your health care provider. If you were prescribed an antibiotic medicine, take it as told by your health care provider. Do not stop taking the antibiotic even if you start to feel better. Use throat sprays to soothe your throat as told by your health care provider. Children can get pharyngitis. Do not give your child aspirin because of the association with Reye's syndrome. Managing pain To help with pain, try: Sipping warm liquids, such as broth, herbal tea, or warm water. Eating or drinking cold or frozen liquids, such as frozen ice pops. Gargling with a mixture of salt and water 3-4 times a day or as needed. To make salt water, completely dissolve -1 tsp (3-6 g) of salt in 1 cup (237 mL) of warm water. Sucking on hard candy or throat lozenges. Putting a cool-mist humidifier in your bedroom at night to moisten the air. Sitting in the bathroom with the door closed for 5-10 minutes while you run hot water in the shower.  General instructions  Do not use any products that contain nicotine or tobacco. These products include cigarettes, chewing tobacco, and vaping devices, such as e-cigarettes. If you need help quitting, ask your health care provider. Rest as told by your health care provider. Drink enough fluid to keep your urine pale yellow. How is this prevented? To help prevent becoming infected or spreading infection: Wash your hands often with soap and water for at least 20 seconds. If soap and water  are not available, use hand sanitizer. Do not touch your eyes, nose, or mouth with unwashed hands, and wash hands after touching these areas. Do not share cups or eating utensils. Avoid close contact with people who are sick. Contact a health care provider if: You have large, tender lumps in your neck. You have a rash. You cough up green, yellow-brown, or bloody mucus. Get help right away if: Your neck becomes stiff. You drool or are unable to swallow liquids. You cannot drink or take medicines without vomiting. You have severe pain that does not go away, even after you take medicine. You have trouble breathing, and it is not caused by a stuffy nose. You have new pain and swelling in your joints such as the knees, ankles, wrists, or elbows. These symptoms may represent a serious problem that is an emergency. Do not wait to see if the symptoms will go away. Get medical help right away. Call your local emergency services (911 in the [place] Do not drive yourself to the hospital. Summary Pharyngitis is redness, pain, and swelling (inflammation) of the throat (pharynx). While pharyngitis can be caused by a bacteria, the most common causes are viral. Most cases of pharyngitis get better on their own without treatment. Bacterial pharyngitis is treated with antibiotic medicines. This information is not intended to replace advice given to you by your health care provider.  Make sure you discuss any questions you have with your health care provider. Document Revised: 10/31/2020 Document Reviewed: 10/31/2020 Elsevier Patient Education  [place]. Gastroesophageal Reflux Disease, Adult Gastroesophageal reflux (GER) happens when acid from the stomach flows up into the tube that connects the mouth and the stomach (esophagus). Normally, food travels down the esophagus and stays in the stomach to be digested. However, when a person has GER, food and stomach acid sometimes move back up into the  esophagus. If this becomes a more serious problem, the person may be diagnosed with a disease called gastroesophageal reflux disease (GERD). GERD occurs when the reflux: Happens often. Causes frequent or severe symptoms. Causes problems such as damage to the esophagus. When stomach acid comes in contact with the esophagus, the acid may cause inflammation in the esophagus. Over time, GERD may create small holes (ulcers) in the lining of the esophagus. What are the causes? This condition is caused by a problem with the muscle between the esophagus and the stomach (lower esophageal sphincter, or LES). Normally, the LES muscle closes after food passes through the esophagus to the stomach. When the LES is weakened or abnormal, it does not close properly, and that allows food and stomach acid to go back up into the esophagus. The LES can be weakened by certain dietary substances, medicines, and medical conditions, including: Tobacco use. Pregnancy. Having a hiatal hernia. Alcohol use. Certain foods and beverages, such as coffee, chocolate, onions, and peppermint. What increases the risk? You are more likely to develop this condition if you: Have an increased body weight. Have a connective tissue disorder. Take NSAIDs, such as ibuprofen. What are the signs or symptoms? Symptoms of this condition include: Heartburn. Difficult or painful swallowing and the feeling of having a lump in the throat. A bitter taste in the mouth. Bad breath and having a large amount of saliva. Having an upset or bloated stomach and belching. Chest pain. Different conditions can cause chest pain. Make sure you see your health care provider if you experience chest pain. Shortness of breath or wheezing. Ongoing (chronic) cough or a nighttime cough. Wearing away of tooth enamel. Weight loss. How is this diagnosed? This condition may be diagnosed based on a medical history and a physical exam. To determine if you have  mild or severe GERD, your health care provider may also monitor how you respond to treatment. You may also have tests, including: A test to examine your stomach and esophagus with a small camera (endoscopy). A test that measures the acidity level in your esophagus. A test that measures how much pressure is on your esophagus. A barium swallow or modified barium swallow test to show the shape, size, and functioning of your esophagus. How is this treated? Treatment for this condition may vary depending on how severe your symptoms are. Your health care provider may recommend: Changes to your diet. Medicine. Surgery. The goal of treatment is to help relieve your symptoms and to prevent complications. Follow these instructions at home: Eating and drinking  Follow a diet as recommended by your health care provider. This may involve avoiding foods and drinks such as: Coffee and tea, with or without caffeine. Drinks that contain alcohol. Energy drinks and sports drinks. Carbonated drinks or sodas. Chocolate and cocoa. Peppermint and mint flavorings. Garlic and onions. Horseradish. Spicy and acidic foods, including peppers, chili powder, curry powder, vinegar, hot sauces, and barbecue sauce. Citrus fruit juices and citrus fruits, such as oranges, lemons, and limes.  Tomato-based foods, such as red sauce, chili, salsa, and pizza with red sauce. Fried and fatty foods, such as donuts, french fries, potato chips, and high-fat dressings. High-fat meats, such as hot dogs and fatty cuts of red and white meats, such as rib eye steak, sausage, ham, and bacon. High-fat dairy items, such as whole milk, butter, and cream cheese. Eat small, frequent meals instead of large meals. Avoid drinking large amounts of liquid with your meals. Avoid eating meals during the 2-3 hours before bedtime. Avoid lying down right after you eat. Do not exercise right after you eat. Lifestyle  Do not use any products that  contain nicotine or tobacco. These products include cigarettes, chewing tobacco, and vaping devices, such as e-cigarettes. If you need help quitting, ask your health care provider. Try to reduce your stress by using methods such as yoga or meditation. If you need help reducing stress, ask your health care provider. If you are overweight, reduce your weight to an amount that is healthy for you. Ask your health care provider for guidance about a safe weight loss goal. General instructions Pay attention to any changes in your symptoms. Take over-the-counter and prescription medicines only as told by your health care provider. Do not take aspirin, ibuprofen, or other NSAIDs unless your health care provider told you to take these medicines. Wear loose-fitting clothing. Do not wear anything tight around your waist that causes pressure on your abdomen. Raise (elevate) the head of your bed about 6 inches (15 cm). You can use a wedge to do this. Avoid bending over if this makes your symptoms worse. Keep all follow-up visits. This is important. Contact a health care provider if: You have: New symptoms. Unexplained weight loss. Difficulty swallowing or it hurts to swallow. Wheezing or a persistent cough. A hoarse voice. Your symptoms do not improve with treatment. Get help right away if: You have sudden pain in your arms, neck, jaw, teeth, or back. You suddenly feel sweaty, dizzy, or light-headed. You have chest pain or shortness of breath. You vomit and the vomit is green, yellow, or black, or it looks like blood or coffee grounds. You faint. You have stool that is red, bloody, or black. You cannot swallow, drink, or eat. These symptoms may represent a serious problem that is an emergency. Do not wait to see if the symptoms will go away. Get medical help right away. Call your local emergency services (911 in the [place] Do not drive yourself to the hospital. Summary Gastroesophageal reflux happens  when acid from the stomach flows up into the esophagus. GERD is a disease in which the reflux happens often, causes frequent or severe symptoms, or causes problems such as damage to the esophagus. Treatment for this condition may vary depending on how severe your symptoms are. Your health care provider may recommend diet and lifestyle changes, medicine, or surgery. Contact a health care provider if you have new or worsening symptoms. Take over-the-counter and prescription medicines only as told by your health care provider. Do not take aspirin, ibuprofen, or other NSAIDs unless your health care provider told you to do so. Keep all follow-up visits as told by your health care provider. This is important. This information is not intended to replace advice given to you by your health care provider. Make sure you discuss any questions you have with your health care provider. Document Revised: 02/13/2020 Document Reviewed: 02/13/2020 Elsevier Patient Education  [place].

## 2022-07-23 NOTE — Progress Notes (Signed)
Subjective:    Patient ID: Grant Wilson, male    DOB: [birth year], 56 y.o.   MRN: [id number]  56y/o african [place] male here for re-evaluation throat pain last office visit 07/03/22.  Had started claritin '10mg'$  po daily has noticed improvement in symptoms but still some congestion and on days he forgets rhinitis/post nasal drip.  Hard palate and throat sore today.  Denied fever/chills/body aches/n/v/d or rash in mouth/heartburn symptoms.  Throat pain has improved typically notices more burning with coffee intake.  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      Review of Systems  Constitutional:  Negative for activity change, appetite change, chills, diaphoresis, fatigue and fever.  HENT:  Positive for congestion, postnasal drip, rhinorrhea and sore throat. Negative for dental problem, drooling, ear discharge, ear pain, facial swelling, hearing loss, mouth sores, nosebleeds, sinus pressure, sinus pain, sneezing, tinnitus, trouble swallowing and voice change.   Eyes:  Negative for photophobia, pain, discharge, redness, itching and visual disturbance.  Respiratory:  Negative for cough, shortness of breath, wheezing and stridor.   Cardiovascular:  Negative for chest pain.  Gastrointestinal:  Negative for abdominal distention, abdominal pain, anal bleeding, blood in stool, constipation, diarrhea, nausea, rectal pain and vomiting.  Genitourinary:  Negative for difficulty urinating.  Musculoskeletal:  Negative for gait problem, neck pain and neck stiffness.  Allergic/Immunologic: Negative for food allergies.  Neurological:  Negative for dizziness, tremors, seizures, syncope, facial asymmetry, speech difficulty, weakness, light-headedness, numbness and headaches.  Hematological:  Negative for adenopathy. Does not bruise/bleed easily.  Psychiatric/Behavioral:  Negative  for agitation, confusion and sleep disturbance.        Objective:   Physical Exam Vitals and nursing note reviewed.  Constitutional:      General: He is awake. He is not in acute distress.    Appearance: He is well-developed, well-groomed and normal weight. He is ill-appearing. He is not toxic-appearing or diaphoretic.  HENT:     Head: Normocephalic and atraumatic.     Jaw: There is normal jaw occlusion. No trismus.     Salivary Glands: Right salivary gland is not diffusely enlarged or tender. Left salivary gland is not diffusely enlarged or tender.     Right Ear: Hearing, ear canal and external ear normal. No decreased hearing noted. No laceration, drainage, swelling or tenderness. A middle ear effusion is present. There is no impacted cerumen. No foreign body. No mastoid tenderness. No PE tube. No hemotympanum. Tympanic membrane is not injected, scarred, perforated, erythematous or retracted.     Left Ear: Hearing, ear canal and external ear normal. No decreased hearing noted. No laceration, drainage, swelling or tenderness. A middle ear effusion is present. There is no impacted cerumen. No foreign body. No mastoid tenderness. No PE tube. No hemotympanum. Tympanic membrane is not injected, scarred, perforated, erythematous or retracted.     Nose: Mucosal edema, congestion and rhinorrhea present. No nasal deformity, septal deviation or laceration. Rhinorrhea is clear.     Right Turbinates: Not enlarged, swollen or pale.     Left Turbinates: Not enlarged, swollen or pale.     Right Sinus: No maxillary sinus tenderness or frontal sinus tenderness.     Left Sinus: No maxillary sinus tenderness or frontal sinus tenderness.     Mouth/Throat:     Lips: Pink. No lesions.     Mouth: Mucous membranes  are moist. Mucous membranes are not pale, not dry and not cyanotic. No lacerations, oral lesions or angioedema.     Dentition: No dental abscesses or gum lesions.     Tongue: No lesions. Tongue does not  deviate from midline.     Palate: No mass and lesions.     Pharynx: Uvula midline. Pharyngeal swelling and posterior oropharyngeal erythema present. No oropharyngeal exudate or uvula swelling.     Tonsils: No tonsillar exudate or tonsillar abscesses. 0 on the right. 0 on the left.     Comments: Mild cobblestoning posterior pharynx; bilateral allergic shiners; bilateral TMs intact without erythema air fluid level clear; clear discharge bilateral nasal turbinates some nasal sniffing noted in clinic audible; mild erythema uvula tip and oropharynx macular Eyes:     General: Lids are normal. Vision grossly intact. Gaze aligned appropriately. Allergic shiner present. No scleral icterus.       Right eye: No foreign body, discharge or hordeolum.        Left eye: No foreign body, discharge or hordeolum.     Extraocular Movements: Extraocular movements intact.     Right eye: Normal extraocular motion and no nystagmus.     Left eye: Normal extraocular motion and no nystagmus.     Conjunctiva/sclera: Conjunctivae normal.     Right eye: Right conjunctiva is not injected. No chemosis, exudate or hemorrhage.    Left eye: Left conjunctiva is not injected. No chemosis, exudate or hemorrhage.    Pupils: Pupils are equal, round, and reactive to light. Pupils are equal.     Right eye: Pupil is round and reactive.     Left eye: Pupil is round and reactive.  Neck:     Thyroid: No thyroid mass or thyromegaly.     Trachea: Trachea and phonation normal. No tracheal tenderness or tracheal deviation.  Cardiovascular:     Rate and Rhythm: Normal rate and regular rhythm.     Pulses: Normal pulses.          Radial pulses are 2+ on the right side and 2+ on the left side.     Heart sounds: Normal heart sounds, S1 normal and S2 normal. No murmur heard.    No friction rub. No gallop.  Pulmonary:     Effort: Pulmonary effort is normal. No respiratory distress.     Breath sounds: Normal breath sounds and air entry. No  stridor, decreased air movement or transmitted upper airway sounds. No decreased breath sounds, wheezing, rhonchi or rales.     Comments: Spoke full sentences without difficulty; no cough observed in exam room Abdominal:     General: Abdomen is flat. There is no distension.     Palpations: Abdomen is soft.  Musculoskeletal:        General: No tenderness. Normal range of motion.     Right hand: Normal strength. Normal capillary refill.     Left hand: Normal strength. Normal capillary refill.     Cervical back: Normal range of motion and neck supple. No swelling, edema, deformity, erythema, signs of trauma, lacerations, rigidity, torticollis, tenderness or crepitus. No pain with movement, spinous process tenderness or muscular tenderness. Normal range of motion.     Thoracic back: No swelling, edema, deformity, signs of trauma, lacerations, spasms or tenderness. Normal range of motion.     Right lower leg: No edema.     Left lower leg: No edema.  Lymphadenopathy:     Head:     Right side of head:  No submental, submandibular, tonsillar, preauricular, posterior auricular or occipital adenopathy.     Left side of head: No submental, submandibular, tonsillar, preauricular, posterior auricular or occipital adenopathy.     Cervical: No cervical adenopathy.     Right cervical: No superficial, deep or posterior cervical adenopathy.    Left cervical: No superficial, deep or posterior cervical adenopathy.  Skin:    General: Skin is warm and dry.     Capillary Refill: Capillary refill takes less than 2 seconds.     Coloration: Skin is not ashen, cyanotic, jaundiced, mottled, pale or sallow.     Findings: No abrasion, abscess, acne, bruising, burn, ecchymosis, erythema, signs of injury, laceration, lesion, petechiae, rash or wound.     Nails: There is no clubbing.  Neurological:     General: No focal deficit present.     Mental Status: He is alert and oriented to person, place, and time. Mental status  is at baseline.     GCS: GCS eye subscore is 4. GCS verbal subscore is 5. GCS motor subscore is 6.     Cranial Nerves: Cranial nerves 2-12 are intact. No cranial nerve deficit, dysarthria or facial asymmetry.     Sensory: Sensation is intact. No sensory deficit.     Motor: Motor function is intact. No weakness, tremor, atrophy, abnormal muscle tone or seizure activity.     Coordination: Coordination is intact. Coordination normal.     Gait: Gait is intact. Gait normal.     Comments: Gait sure and steady in clinic; in/out of chair without difficulty; bilateral hand grasp equal 5/5  Psychiatric:        Attention and Perception: Attention and perception normal.        Mood and Affect: Mood and affect normal.        Speech: Speech normal.        Behavior: Behavior normal. Behavior is cooperative.        Thought Content: Thought content normal.        Cognition and Memory: Cognition and memory normal.        Judgment: Judgment normal.     Home covid test results negative verified by clinic staff      Assessment & Plan:  A-acute rhiniitis, elevated blood pressure with diagnosis hypertension, acute pharyngitis   P-vasomotor cold temp rhinitis versus viral URI.  Claritin helped some but did not resolve so allergic component also dust at work.  Erythema posterior pharynx improved and cobblestoning less pronounced.  Continue OTC loratadine '10mg'$  po daily.  Given home covid test to complete free [place] govt prior to returning to [employer] today.  If positive put on mask from clinic/notify clinic staff and will be going home to start quarantine.  Discussed metapneumovirus, adenovirus, flu, covid, RSV circulating in community causing rhinitis in addition to allergies and cold temps.  Discussed to continue nasal saline 2 sprays each nostril q2h prn congestion/dryness.  Exitcare handout non-allergic rhinitis.  Follow up re-evaluation if new or worsening symptoms  Patient refused printed AVS..  Patient  verbalized understanding information/instructions, agreed with plan of care and had no further questions at this time.   Patient stated he has not been taking hydrochlorothiazide every day.  Clinic staff stressed importance of him taking it daily.  Follow up with RN Evlyn Kanner repeat BP after taking medication consistently.  Acute illness today also.  Had adverse effect losartan visual changes discontinued on his own.  Encouraged patient to take his hydrochlorothiazide daily and avoid high  salt diet.  Drink water to keep urine clear pale yellow.  Patient verbalized understanding information/instructions and had no further questions at this time.  Suspect silent GERD will trial omeprazole '20mg'$  DR po daily with food x 2 weeks #90 RF0 dispensed from [facility] to patient.  Discussed caffeine can cause stomach sphincters to open/allowing reflux.  If gastritis hot liquids would worsen pain also.  Trial to allow mucosa to heal.  Exitcare handout on GERD.  Patient verbalized understanding information/instructions, agreed with plan of care and had no further questions at this time.

## 2022-07-29 ENCOUNTER — Ambulatory Visit: Payer: Self-pay | Admitting: Registered Nurse

## 2022-07-31 ENCOUNTER — Ambulatory Visit: Payer: Self-pay | Admitting: Registered Nurse

## 2022-07-31 ENCOUNTER — Encounter: Payer: Self-pay | Admitting: Registered Nurse

## 2022-07-31 VITALS — BP 134/84 | HR 71

## 2022-07-31 DIAGNOSIS — R070 Pain in throat: Secondary | ICD-10-CM

## 2022-07-31 DIAGNOSIS — I10 Essential (primary) hypertension: Secondary | ICD-10-CM

## 2022-07-31 DIAGNOSIS — J00 Acute nasopharyngitis [common cold]: Secondary | ICD-10-CM

## 2022-07-31 MED ORDER — OMEPRAZOLE 20 MG PO CPDR: 20.0000 mg | DELAYED_RELEASE_CAPSULE | Freq: Every day | ORAL | 0 refills | Status: DC

## 2022-07-31 NOTE — Patient Instructions (Signed)
Managing Your Hypertension Hypertension, also called high blood pressure, is when the force of the blood pressing against the walls of the arteries is too strong. Arteries are blood vessels that carry blood from your heart throughout your body. Hypertension forces the heart to work harder to pump blood and may cause the arteries to become narrow or stiff. Understanding blood pressure readings A blood pressure reading includes a higher number over a lower number: The first, or top, number is called the systolic pressure. It is a measure of the pressure in your arteries as your heart beats. The second, or bottom number, is called the diastolic pressure. It is a measure of the pressure in your arteries as the heart relaxes. For most people, a normal blood pressure is below 120/80. Your personal target blood pressure may vary depending on your medical conditions, your age, and other factors. Blood pressure is classified into four stages. Based on your blood pressure reading, your health care provider may use the following stages to determine what type of treatment you need, if any. Systolic pressure and diastolic pressure are measured in a unit called millimeters of mercury (mmHg). Normal Systolic pressure: below 120. Diastolic pressure: below 80. Elevated Systolic pressure: 120-129. Diastolic pressure: below 80. Hypertension stage 1 Systolic pressure: 130-139. Diastolic pressure: 80-89. Hypertension stage 2 Systolic pressure: 140 or above. Diastolic pressure: 90 or above. How can this condition affect me? Managing your hypertension is very important. Over time, hypertension can damage the arteries and decrease blood flow to parts of the body, including the brain, heart, and kidneys. Having untreated or uncontrolled hypertension can lead to: A heart attack. A stroke. A weakened blood vessel (aneurysm). Heart failure. Kidney damage. Eye damage. Memory and concentration problems. Vascular  dementia. What actions can I take to manage this condition? Hypertension can be managed by making lifestyle changes and possibly by taking medicines. Your health care provider will help you make a plan to bring your blood pressure within a normal range. You may be referred for counseling on a healthy diet and physical activity. Nutrition  Eat a diet that is high in fiber and potassium, and low in salt (sodium), added sugar, and fat. An example eating plan is called the DASH diet. DASH stands for Dietary Approaches to Stop Hypertension. To eat this way: Eat plenty of fresh fruits and vegetables. Try to fill one-half of your plate at each meal with fruits and vegetables. Eat whole grains, such as whole-wheat pasta, brown rice, or whole-grain bread. Fill about one-fourth of your plate with whole grains. Eat low-fat dairy products. Avoid fatty cuts of meat, processed or cured meats, and poultry with skin. Fill about one-fourth of your plate with lean proteins such as fish, chicken without skin, beans, eggs, and tofu. Avoid pre-made and processed foods. These tend to be higher in sodium, added sugar, and fat. Reduce your daily sodium intake. Many people with hypertension should eat less than 1,500 mg of sodium a day. Lifestyle  Work with your health care provider to maintain a healthy body weight or to lose weight. Ask what an ideal weight is for you. Get at least 30 minutes of exercise that causes your heart to beat faster (aerobic exercise) most days of the week. Activities may include walking, swimming, or biking. Include exercise to strengthen your muscles (resistance exercise), such as weight lifting, as part of your weekly exercise routine. Try to do these types of exercises for 30 minutes at least 3 days a week. Do   not use any products that contain nicotine or tobacco. These products include cigarettes, chewing tobacco, and vaping devices, such as e-cigarettes. If you need help quitting, ask your  health care provider. Control any long-term (chronic) conditions you have, such as high cholesterol or diabetes. Identify your sources of stress and find ways to manage stress. This may include meditation, deep breathing, or making time for fun activities. Alcohol use Do not drink alcohol if: Your health care provider tells you not to drink. You are pregnant, may be pregnant, or are planning to become pregnant. If you drink alcohol: Limit how much you have to: 0-1 drink a day for women. 0-2 drinks a day for men. Know how much alcohol is in your drink. In the [place] one drink equals one 12 oz bottle of beer (355 mL), one 5 oz glass of wine (148 mL), or one 1 oz glass of hard liquor (44 mL). Medicines Your health care provider may prescribe medicine if lifestyle changes are not enough to get your blood pressure under control and if: Your systolic blood pressure is 130 or higher. Your diastolic blood pressure is 80 or higher. Take medicines only as told by your health care provider. Follow the directions carefully. Blood pressure medicines must be taken as told by your health care provider. The medicine does not work as well when you skip doses. Skipping doses also puts you at risk for problems. Monitoring Before you monitor your blood pressure: Do not smoke, drink caffeinated beverages, or exercise within 30 minutes before taking a measurement. Use the bathroom and empty your bladder (urinate). Sit quietly for at least 5 minutes before taking measurements. Monitor your blood pressure at home as told by your health care provider. To do this: Sit with your back straight and supported. Place your feet flat on the floor. Do not cross your legs. Support your arm on a flat surface, such as a table. Make sure your upper arm is at heart level. Each time you measure, take two or three readings one minute apart and record the results. You may also need to have your blood pressure checked regularly by  your health care provider. General information Talk with your health care provider about your diet, exercise habits, and other lifestyle factors that may be contributing to hypertension. Review all the medicines you take with your health care provider because there may be side effects or interactions. Keep all follow-up visits. Your health care provider can help you create and adjust your plan for managing your high blood pressure. Where to find more information National Heart, Lung, and Blood Institute: www.nhlbi.nih.gov American Heart Association: www.heart.org Contact a health care provider if: You think you are having a reaction to medicines you have taken. You have repeated (recurrent) headaches. You feel dizzy. You have swelling in your ankles. You have trouble with your vision. Get help right away if: You develop a severe headache or confusion. You have unusual weakness or numbness, or you feel faint. You have severe pain in your chest or abdomen. You vomit repeatedly. You have trouble breathing. These symptoms may be an emergency. Get help right away. Call 911. Do not wait to see if the symptoms will go away. Do not drive yourself to the hospital. Summary Hypertension is when the force of blood pumping through your arteries is too strong. If this condition is not controlled, it may put you at risk for serious complications. Your personal target blood pressure may vary depending on your medical conditions,   your age, and other factors. For most people, a normal blood pressure is less than 120/80. Hypertension is managed by lifestyle changes, medicines, or both. Lifestyle changes to help manage hypertension include losing weight, eating a healthy, low-sodium diet, exercising more, stopping smoking, and limiting alcohol. This information is not intended to replace advice given to you by your health care provider. Make sure you discuss any questions you have with your health care  provider. Document Revised: 04/18/2021 Document Reviewed: 04/18/2021 Elsevier Patient Education  2023 [employer]  

## 2022-07-31 NOTE — Progress Notes (Signed)
Subjective:    Patient ID: Grant Wilson, male    DOB: [birth year], 56 y.o.   MRN: [id number]  56y/o african [place] male here for follow up hypertension and throat pain.  Patient reported throat pain much improved since starting omeprazole still occasional pain but decreased.  Has been trying to take his blood pressure medication every day but occasionally misses a dose.  Denied GI upset, headache, n/v/d, visual changes, dyspnea or chest pain.  Some nasal congestion when working in duty areas at work nasal saline helping would like refill bottle from clinic stock.      Review of Systems  Constitutional:  Negative for activity change, appetite change, chills, diaphoresis, fatigue and fever.  HENT:  Positive for congestion. Negative for trouble swallowing and voice change.   Eyes:  Negative for photophobia, pain and redness.  Respiratory:  Negative for cough, chest tightness, shortness of breath, wheezing and stridor.   Cardiovascular:  Negative for chest pain and palpitations.  Gastrointestinal:  Negative for diarrhea, nausea and vomiting.  Genitourinary:  Negative for difficulty urinating.  Musculoskeletal:  Negative for gait problem, neck pain and neck stiffness.  Skin:  Negative for rash.       Objective:   Physical Exam Vitals and nursing note reviewed.  Constitutional:      General: He is awake. He is not in acute distress.    Appearance: Normal appearance. He is well-developed, well-groomed and normal weight. He is not ill-appearing, toxic-appearing or diaphoretic.  HENT:     Head: Normocephalic and atraumatic.     Jaw: There is normal jaw occlusion.     Salivary Glands: Right salivary gland is not diffusely enlarged or tender. Left salivary gland is not diffusely enlarged or tender.     Right Ear: Hearing and external ear normal.     Left Ear: Hearing and external ear normal.     Nose: Congestion present. No rhinorrhea.     Right Turbinates: Not enlarged, swollen or  pale.     Left Turbinates: Not enlarged, swollen or pale.     Right Sinus: No maxillary sinus tenderness or frontal sinus tenderness.     Left Sinus: No maxillary sinus tenderness or frontal sinus tenderness.     Comments: Lower eyelid nonpitting edema allergic shiners 2+/4 bilaterally; cobblestoning posterior pharynx mild;     Mouth/Throat:     Lips: Pink. No lesions.     Mouth: Mucous membranes are moist. No oral lesions or angioedema.     Dentition: No gum lesions.     Tongue: No lesions. Tongue does not deviate from midline.     Palate: No mass and lesions.     Pharynx: Uvula midline. Pharyngeal swelling present. No oropharyngeal exudate, posterior oropharyngeal erythema or uvula swelling.     Tonsils: No tonsillar exudate.  Eyes:     General: Lids are normal. Vision grossly intact. Gaze aligned appropriately. Allergic shiner present. No scleral icterus.       Right eye: No discharge.        Left eye: No discharge.     Extraocular Movements: Extraocular movements intact.     Conjunctiva/sclera: Conjunctivae normal.     Pupils: Pupils are equal, round, and reactive to light.  Neck:     Trachea: Trachea and phonation normal.  Cardiovascular:     Rate and Rhythm: Normal rate and regular rhythm.     Pulses: Normal pulses.          Radial pulses are 2+  on the right side and 2+ on the left side.     Heart sounds: Normal heart sounds, S1 normal and S2 normal.  Pulmonary:     Effort: Pulmonary effort is normal. No respiratory distress.     Breath sounds: Normal breath sounds and air entry. No stridor or transmitted upper airway sounds. No wheezing, rhonchi or rales.     Comments: Spoke full sentences without difficulty; no cough observed in exam room Abdominal:     General: Abdomen is flat.  Musculoskeletal:        General: Normal range of motion.     Right hand: Normal strength. Normal capillary refill.     Left hand: Normal strength. Normal capillary refill.     Cervical back:  Normal range of motion and neck supple. No swelling, edema, deformity, erythema, signs of trauma, lacerations, rigidity, spasms, torticollis, tenderness or crepitus. No pain with movement. Normal range of motion.     Thoracic back: No swelling, edema, deformity, signs of trauma, lacerations, spasms or tenderness.     Right lower leg: No edema.     Left lower leg: No edema.  Lymphadenopathy:     Head:     Right side of head: No submandibular or preauricular adenopathy.     Left side of head: No submandibular or preauricular adenopathy.     Cervical: No cervical adenopathy.     Right cervical: No superficial cervical adenopathy.    Left cervical: No superficial cervical adenopathy.  Skin:    General: Skin is warm and dry.     Capillary Refill: Capillary refill takes less than 2 seconds.     Coloration: Skin is not ashen, cyanotic, jaundiced, mottled, pale or sallow.     Findings: No abrasion, abscess, acne, bruising, burn, ecchymosis, erythema, signs of injury, laceration, lesion, petechiae, rash or wound.  Neurological:     General: No focal deficit present.     Mental Status: He is alert and oriented to person, place, and time. Mental status is at baseline.     GCS: GCS eye subscore is 4. GCS verbal subscore is 5. GCS motor subscore is 6.     Cranial Nerves: Cranial nerves 2-12 are intact. No cranial nerve deficit, dysarthria or facial asymmetry.     Sensory: Sensation is intact. No sensory deficit.     Motor: Motor function is intact. No weakness, tremor, abnormal muscle tone or seizure activity.     Coordination: Coordination is intact. Coordination normal.     Gait: Gait is intact. Gait normal.     Comments: In/out of chair and on/off exam table without difficulty; gait sure and steady in clinic; bilateral hand grasp equal 5/5  Psychiatric:        Attention and Perception: Attention and perception normal.        Mood and Affect: Mood and affect normal.        Speech: Speech normal.         Behavior: Behavior normal. Behavior is cooperative.        Thought Content: Thought content normal.        Cognition and Memory: Cognition and memory normal.        Judgment: Judgment normal.           Assessment & Plan:   A-essential hypertension, acute rhinitis and throat pain  P-continue hydrochlorothiazide '25mg'$  po daily.  Keep sodium intake less than '2000mg'$  per day  exitcare handout on managing hypertension.  Follow up in 2 weeks  for repeat BP check with RN Kimrey  Patient verbalized understanding information/instructions, agreed with plan of care and had no further questions at this time.  Suspect silent GERD as omeprazole improved throat pain.  Continue '20mg'$  DR omeprazole po daily with meal x 2 weeks.  Re-evaluation in 2 weeks.  Suspect post nasal drip causing residual sore throat as nasal congestion/allergic shiners with swollen lower eyelids nonpitting.  Discussed vasomotor rhinitis and viral rhinitis very common in community at this time.  Continue nasal saline 2 sprays each nostril q2h prn congestion especially after working in dusty areas. Consider adding fluticasone nasal 17mg 1 spray each nostril BID if worsening rhinitis/congestion.   Consider mask wear if working with a lot of dust.  OTC antihistamine of choice and shower after work.  Discussed cold temperatures below freezing at night causing am/pm vasomotor rhinitis.  If body aches/fever/chills most likely viral URI as many variants circulating in community e.g. flu, RSV, adenovirus, metapneumovirus, covid.  Covid test was negative at last appt and no new or worsening symptoms.  Patient verbalized understanding information/instructions, agreed with plan of care and had no further questions at this time.

## 2022-08-14 ENCOUNTER — Ambulatory Visit: Payer: Self-pay | Admitting: Occupational Medicine

## 2022-08-14 ENCOUNTER — Ambulatory Visit: Payer: No Typology Code available for payment source | Admitting: Registered Nurse

## 2022-09-11 ENCOUNTER — Other Ambulatory Visit: Payer: Self-pay | Admitting: Emergency Medicine

## 2022-10-02 ENCOUNTER — Ambulatory Visit: Payer: Self-pay | Admitting: Occupational Medicine

## 2022-10-02 ENCOUNTER — Telehealth: Payer: Self-pay | Admitting: Registered Nurse

## 2022-10-02 ENCOUNTER — Encounter: Payer: Self-pay | Admitting: Registered Nurse

## 2022-10-02 VITALS — BP 166/88 | HR 100 | Ht 66.5 in | Wt 154.0 lb

## 2022-10-02 DIAGNOSIS — I10 Essential (primary) hypertension: Secondary | ICD-10-CM

## 2022-10-02 MED ORDER — HYDROCHLOROTHIAZIDE 12.5 MG PO TABS: 25.0000 mg | ORAL_TABLET | Freq: Every day | ORAL | 0 refills | Status: DC

## 2022-10-02 NOTE — Progress Notes (Signed)
Patient doesn't take BP medications as he should got refill today. Noted elevated BP. Not met Be well criteria working on possible alternative. Checking BP for 1 week at home then coming to the clinic to re evaluated. Educated to eat less 1500 mg of Salt. Monitor food labels for salt. Knows to try chia seeds.

## 2022-10-02 NOTE — Telephone Encounter (Signed)
Patient requested refill hydrochlorothiazide 57m po daily.  Last filled 05/13/22 note taking every day.  Last labs   Latest Reference Range & Units 06/05/22 08:59  Sodium 134 - 144 mmol/L 137  Potassium 3.5 - 5.2 mmol/L 4.8  Chloride 96 - 106 mmol/L 100  CO2 20 - 29 mmol/L 27  Glucose 70 - 99 mg/dL 92  BUN 6 - 24 mg/dL 7  Creatinine 0.76 - 1.27 mg/dL 0.97  Calcium 8.7 - 10.2 mg/dL 9.1  BUN/Creatinine Ratio 9 - 20  7 (L)  eGFR >59 mL/min/1.73 92  Phosphorus 2.8 - 4.1 mg/dL 3.6  Alkaline Phosphatase 44 - 121 IU/L 57  Albumin 3.8 - 4.9 g/dL 4.5  Albumin/Globulin Ratio 1.2 - 2.2  2.0  Uric Acid 3.8 - 8.4 mg/dL 5.3  AST 0 - 40 IU/L 23  ALT 0 - 44 IU/L 21  Total Protein 6.0 - 8.5 g/dL 6.8  Total Bilirubin 0.0 - 1.2 mg/dL 0.4  GGT 0 - 65 IU/L 24  Estimated CHD Risk 0.0 - 1.0 times avg. 1.0  LDH 121 - 224 IU/L 177  Total CHOL/HDL Ratio 0.0 - 5.0 ratio 4.8  Cholesterol, Total 100 - 199 mg/dL 213 (H)  HDL Cholesterol >39 mg/dL 44  Triglycerides 0 - 149 mg/dL 75  VLDL Cholesterol Cal 5 - 40 mg/dL 13  LDL Chol Calc (NIH) 0 - 99 mg/dL 156 (H)  Iron 38 - 169 ug/dL 91  Folate >3.0 ng/mL 12.1  Vitamin D, 25-Hydroxy 30.0 - 100.0 ng/mL 13.3 (L)  Vitamin B12 232 - 1,245 pg/mL 484  Vitamin B6 ug/L CANCELED  Globulin, Total 1.5 - 4.5 g/dL 2.3  WBC 3.4 - 10.8 x10E3/uL x10E3/uL 2.9 (L) CANCELED  RBC 4.14 - 5.80 x10E6/uL x10E6/uL 4.81 CANCELED  Hemoglobin 13.0 - 17.7 g/dL g/dL 14.8 CANCELED  HCT 37.5 - 51.0 % % 44.7 CANCELED  MCV 79 - 97 fL fL 93 CANCELED  MCH 26.6 - 33.0 pg pg 30.8 CANCELED  MCHC 31.5 - 35.7 g/dL g/dL 33.1 CANCELED  RDW 11.6 - 15.4 % % 13.4 CANCELED  Platelets 150 - 450 x10E3/uL x10E3/uL 228 CANCELED  nRBC % CANCELED  Hematology Comments:  CANCELED  Neutrophils Not Estab. % % 37 CANCELED  Immature Granulocytes Not Estab. % % 0 CANCELED  NEUT# 1.4 - 7.0 x10E3/uL x10E3/uL 1.1 (L) CANCELED  Lymphocyte # 0.7 - 3.1 x10E3/uL x10E3/uL 1.3 CANCELED   Monocytes Absolute 0.1 - 0.9 x10E3/uL x10E3/uL 0.4 CANCELED  Basophils Absolute 0.0 - 0.2 x10E3/uL x10E3/uL 0.0 CANCELED  Immature Grans (Abs) 0.0 - 0.1 x10E3/uL x10E3/uL 0.0 CANCELED  Immature Cells  CANCELED  Lymphs Not Estab. % % 44 CANCELED  Monocytes Not Estab. % % 15 CANCELED  Basos Not Estab. % % 1 CANCELED  Eos Not Estab. % % 3 CANCELED  EOS (ABSOLUTE) 0.0 - 0.4 x10E3/uL x10E3/uL 0.1 CANCELED  Hemoglobin A1C 4.8 - 5.6 % 6.0 (H)  Est. average glucose Bld gHb Est-mCnc mg/dL 126  TSH 0.450 - 4.500 uIU/mL 2.100  Thyroxine (T4) 4.5 - 12.0 ug/dL 6.4  Free Thyroxine Index 1.2 - 4.9  1.5  T3 Uptake Ratio 24 - 39 % 24  Prostate Specific Ag, Serum 0.0 - 4.0 ng/mL 1.9  (L): Data is abnormally low (H): Data is abnormally high  Patient to have BP and weight check with RN Kimrey today.  Dispensed 45 day supply to patient today 256mout of stock patient to take 2 tabs 12.63m53mo daily and sign be well paperwork met  1/3 requirements Hgba1c 6.  LDL did not meet less than 130.  BP pending. Goal 135/85 or less.

## 2022-10-09 ENCOUNTER — Encounter: Payer: Self-pay | Admitting: Registered Nurse

## 2022-10-09 ENCOUNTER — Ambulatory Visit: Payer: Self-pay | Admitting: Registered Nurse

## 2022-10-09 VITALS — BP 150/89 | HR 88 | Resp 16

## 2022-10-09 DIAGNOSIS — I10 Essential (primary) hypertension: Secondary | ICD-10-CM

## 2022-10-09 MED ORDER — HYDROCHLOROTHIAZIDE 25 MG PO TABS: 25.0000 mg | ORAL_TABLET | Freq: Every day | ORAL | 0 refills | Status: DC

## 2022-10-09 NOTE — Telephone Encounter (Signed)
Patient seen in clinic today see office note has been taking hydrochlorothiazide 54m po daily missed one day in past week.

## 2022-10-09 NOTE — Progress Notes (Signed)
Subjective:    Patient ID: Grant Wilson, male    DOB: 12-10-1965, 57 y.o.   MRN: [id number]  56y/o african [place] male established patient here for BP check, nasal saline refill from clinic stock.  Has taken blood pressure medication every day except one in the past week.  Feeling well denied concerns.  Has been reading labels to ensure not getting hidden excess salt in diet     Review of Systems  Constitutional:  Negative for chills, diaphoresis and fever.  Respiratory:  Negative for shortness of breath, wheezing and stridor.   Cardiovascular:  Negative for chest pain and palpitations.  Neurological:  Negative for headaches.       Objective:   Physical Exam Vitals and nursing note reviewed.  Constitutional:      General: He is awake. He is not in acute distress.    Appearance: Normal appearance. He is well-developed, well-groomed and normal weight. He is not ill-appearing, toxic-appearing or diaphoretic.  HENT:     Head: Normocephalic and atraumatic.     Jaw: There is normal jaw occlusion.     Salivary Glands: Right salivary gland is not diffusely enlarged. Left salivary gland is not diffusely enlarged.     Right Ear: Hearing and external ear normal.     Left Ear: Hearing and external ear normal.     Nose: Nose normal. No congestion or rhinorrhea.     Mouth/Throat:     Lips: Pink. No lesions.     Mouth: Mucous membranes are moist.     Pharynx: Oropharynx is clear.  Eyes:     General: Lids are normal. Vision grossly intact. Gaze aligned appropriately. No scleral icterus.       Right eye: No discharge.        Left eye: No discharge.     Extraocular Movements: Extraocular movements intact.     Conjunctiva/sclera: Conjunctivae normal.     Pupils: Pupils are equal, round, and reactive to light.  Neck:     Trachea: Trachea normal.  Cardiovascular:     Rate and Rhythm: Normal rate and regular rhythm.     Pulses: Normal pulses.     Heart sounds: Normal heart sounds.   Pulmonary:     Effort: Pulmonary effort is normal. No respiratory distress.     Breath sounds: Normal breath sounds and air entry. No stridor or transmitted upper airway sounds. No wheezing, rhonchi or rales.     Comments: Spoke full sentences without difficulty; no cough observed in exam room Abdominal:     General: Abdomen is flat.  Musculoskeletal:        General: Normal range of motion.     Cervical back: Normal range of motion and neck supple.  Lymphadenopathy:     Head:     Right side of head: No submandibular or preauricular adenopathy.     Left side of head: No submandibular or preauricular adenopathy.     Cervical:     Right cervical: No superficial cervical adenopathy.    Left cervical: No superficial cervical adenopathy.  Skin:    General: Skin is warm and dry.     Capillary Refill: Capillary refill takes less than 2 seconds.     Coloration: Skin is not ashen, cyanotic, jaundiced, mottled, pale or sallow.     Findings: No abrasion, bruising, burn, erythema, signs of injury, laceration, petechiae, rash or wound.  Neurological:     General: No focal deficit present.     Mental  Status: He is alert and oriented to person, place, and time. Mental status is at baseline.     Motor: Motor function is intact. No weakness, tremor, abnormal muscle tone or seizure activity.     Coordination: Coordination is intact. Coordination normal.     Gait: Gait is intact. Gait normal.     Comments: In/out of chair without difficulty; gait sure and steady in clinic; bilateral hand grasp equal 5/5  Psychiatric:        Attention and Perception: Attention and perception normal.        Mood and Affect: Mood and affect normal.        Speech: Speech normal.        Behavior: Behavior normal. Behavior is cooperative.        Thought Content: Thought content normal.        Cognition and Memory: Cognition and memory normal.        Judgment: Judgment normal.           Assessment & Plan:   A-essential hypertension  P-received 7m tablet hydrochlorothiazide today dispensed 90 tabs take 1 daily po to patient from [facility].  Patient to continue follow up with RN KEvlyn Kannerfor BP checks. Different days/times.  Continue exercise 150 minutes per week.  Keeping sodium intake less than 20057mper day.  Avoiding weight gain.  Patient agreed with plan of care and had no further questions at this time.  Discussed ER if chest pain, worst headache of life, dyspnea or visual changes for re-evaluation.

## 2022-10-23 ENCOUNTER — Telehealth: Payer: Self-pay | Admitting: Registered Nurse

## 2022-10-23 ENCOUNTER — Encounter: Payer: Self-pay | Admitting: Registered Nurse

## 2022-10-23 ENCOUNTER — Ambulatory Visit: Payer: Self-pay | Admitting: Occupational Medicine

## 2022-10-23 DIAGNOSIS — R202 Paresthesia of skin: Secondary | ICD-10-CM

## 2022-10-23 DIAGNOSIS — E559 Vitamin D deficiency, unspecified: Secondary | ICD-10-CM

## 2022-10-23 DIAGNOSIS — R7303 Prediabetes: Secondary | ICD-10-CM

## 2022-10-23 DIAGNOSIS — E678 Other specified hyperalimentation: Secondary | ICD-10-CM

## 2022-10-23 DIAGNOSIS — Z8639 Personal history of other endocrine, nutritional and metabolic disease: Secondary | ICD-10-CM

## 2022-10-23 NOTE — Progress Notes (Signed)
Lab drawn from Left AC tolerated well no issues noted.   Patient spoke to [facility] NP about warm sensation to feet. B6 and B12 off causes doing labs today.   Be well insurance premium discount evaluation:   Patient completed PCM office visit epic reviewed by RN Evlyn Kanner and transcribed. Labs  Tobacco attestation signed. [facility] ROI formed signed. Forms placed in the chart.   Patient given handouts for [facility] pharmacies and discount drugs list,MyChart, Tele doc setup, Tele doc Behavioral, [place] counseling and [facility] counseling.  What to do for infectious illness protocol. Given handout for list of medications that can be filled at [facility]. Given Clinic hours and Clinic Email.

## 2022-10-24 ENCOUNTER — Other Ambulatory Visit: Payer: Self-pay | Admitting: Registered Nurse

## 2022-10-24 DIAGNOSIS — R7303 Prediabetes: Secondary | ICD-10-CM

## 2022-10-24 DIAGNOSIS — E559 Vitamin D deficiency, unspecified: Secondary | ICD-10-CM

## 2022-10-24 MED ORDER — CHOLECALCIFEROL 1.25 MG (50000 UT) PO TABS: 1.0000 | ORAL_TABLET | ORAL | 2 refills | Status: AC

## 2022-10-24 NOTE — Telephone Encounter (Signed)
Electronic Rx sent to pharmacy of choice continue cholecalciferol 50, 000 units po weekly with meal recheck level in 12 months.  #12 RF2

## 2022-10-24 NOTE — Progress Notes (Signed)
My chart message sent to patient Grant Wilson, Your Hgba1c 3 month blood sugar still elevated at 6.  Please schedule appt with dietitian.  Vitamin D now normal 32 continue supplement.  Vitamin B12 normal and Vitamin B6 results still pending.  Next labs in 3 months A1c.  Repeat Vitamin D in 1 year.  Please let me know if you have questions.   Sincerely, Gerarda Fraction NP-C

## 2022-10-27 ENCOUNTER — Ambulatory Visit: Payer: Self-pay | Admitting: Occupational Medicine

## 2022-10-27 ENCOUNTER — Encounter: Payer: Self-pay | Admitting: Registered Nurse

## 2022-10-27 DIAGNOSIS — E559 Vitamin D deficiency, unspecified: Secondary | ICD-10-CM

## 2022-10-27 DIAGNOSIS — R7303 Prediabetes: Secondary | ICD-10-CM

## 2022-10-27 LAB — VITAMIN D 25 HYDROXY (VIT D DEFICIENCY, FRACTURES): Vit D, 25-Hydroxy: 32.8 ng/mL (ref 30.0–100.0)

## 2022-10-27 LAB — VITAMIN B6: Vitamin B6: 93 ug/L — ABNORMAL HIGH (ref 3.4–65.2)

## 2022-10-27 LAB — VITAMIN B12: Vitamin B-12: 582 pg/mL (ref 232–1245)

## 2022-10-27 LAB — HGB A1C W/O EAG: Hgb A1c MFr Bld: 6 % — ABNORMAL HIGH (ref 4.8–5.6)

## 2022-10-27 NOTE — Progress Notes (Signed)
My chart message sent to patient Grant Wilson, Your vitamin B6 is high again.  Please stop all B6 supplements including your multivitamin.  Will retest your level again in 1-3 months when you need another blood draw nonfasting.  High B6 can cause nerve damage.  Please let us know if you have further questions. Sincerely, Gerarda Fraction NP-C

## 2022-10-27 NOTE — Progress Notes (Signed)
Your vitamin B6 is high again.  Please stop all B6 supplements including your multivitamin. [Patient educated to Stop multivitamin. Will retest your level again in 1-3 months when you need another blood draw nonfasting.  High B6 can cause nerve damage.  Your Hgba1c 3 month blood sugar still elevated at 6.  Please schedule appt with dietitian. Emailed link to patient for dietitian. Scheduled follow up labs.   Vitamin D now normal 32 continue supplement.  Vitamin B12 normal and Vitamin B6 results still pending.  Next labs in 3 months A1c.  Repeat Vitamin D in 1 year.   Asked if needs to take OTC vitamin D.  NP made aware.

## 2022-10-27 NOTE — Addendum Note (Signed)
Addended by: Gerarda Fraction A on: 10/27/2022 10:09 AM   Modules accepted: Orders

## 2022-10-29 NOTE — Progress Notes (Signed)
Patient seen 07/31/22 and refilled hydrochlorothiazide see office note

## 2022-12-02 ENCOUNTER — Ambulatory Visit: Payer: Self-pay | Admitting: Registered Nurse

## 2022-12-02 VITALS — BP 132/75 | HR 82

## 2022-12-02 DIAGNOSIS — R251 Tremor, unspecified: Secondary | ICD-10-CM

## 2022-12-02 NOTE — Progress Notes (Signed)
Subjective:    Patient ID: Grant Wilson, male    DOB: [birth year], 57 y.o.   MRN: [id number]  57y/o african [place] male established patient here to discuss lip symptoms this weekend/earlier in week reoccurring.  Felt like lips trembling but then stopped and reoccurred again yesterday.  Patient stated he sleeps sometimes only 4 hours per night.  Not taking hydrochlorothiazide every day still has plenty of pills at home doesn't need refill.  Had 2 - 12 oz cups of coffee on day he had symptoms this weekend.  Typically 1 cup or less of coffee per day.  Denied tingling/numbness hands/feet, headache, weakness, fever, chills, nausea, vomiting, diarrhea, body aches or rashes/oral sores.  Denied changes in hygiene products, job duties/stress level.  Patient has been trying to improve diet and trying chia/flax seeds.      Review of Systems  Constitutional:  Negative for activity change, appetite change, chills, diaphoresis and fever.  HENT:  Negative for congestion, dental problem, drooling, facial swelling, sore throat, trouble swallowing and voice change.   Eyes:  Negative for photophobia and visual disturbance.  Respiratory:  Negative for cough and shortness of breath.   Gastrointestinal:  Negative for diarrhea, nausea and vomiting.  Musculoskeletal:  Negative for gait problem, myalgias, neck pain and neck stiffness.  Skin:  Negative for color change, pallor, rash and wound.  Neurological:  Negative for dizziness, tremors, facial asymmetry, weakness, light-headedness, numbness and headaches.  Hematological:  Negative for adenopathy.  Psychiatric/Behavioral:  Negative for agitation, confusion and sleep disturbance.        Objective:   Physical Exam Vitals and nursing note reviewed.  Constitutional:      General: He is awake. He is not in acute distress.    Appearance: Normal appearance. He is well-developed, well-groomed and normal weight. He is not ill-appearing, toxic-appearing or  diaphoretic.  HENT:     Head: Normocephalic and atraumatic.     Jaw: There is normal jaw occlusion. No trismus or pain on movement.     Salivary Glands: Right salivary gland is not diffusely enlarged or tender. Left salivary gland is not diffusely enlarged.     Right Ear: Hearing and external ear normal.     Left Ear: Hearing and external ear normal.     Nose: Nose normal. No congestion or rhinorrhea.     Mouth/Throat:     Lips: Pink. No lesions.     Mouth: Mucous membranes are moist. No oral lesions or angioedema.     Dentition: No gum lesions.     Tongue: No lesions. Tongue does not deviate from midline.     Palate: No mass and lesions.     Pharynx: Oropharynx is clear. Uvula midline. No uvula swelling.     Tonsils: No tonsillar exudate or tonsillar abscesses.  Eyes:     General: Lids are normal. Vision grossly intact. Gaze aligned appropriately. Allergic shiner present. No scleral icterus.       Right eye: No discharge.        Left eye: No discharge.     Extraocular Movements: Extraocular movements intact.     Right eye: Normal extraocular motion and no nystagmus.     Left eye: Normal extraocular motion and no nystagmus.     Conjunctiva/sclera: Conjunctivae normal.     Pupils: Pupils are equal, round, and reactive to light.  Neck:     Trachea: Trachea and phonation normal.  Cardiovascular:     Rate and Rhythm: Normal rate and  regular rhythm.     Pulses: Normal pulses.          Radial pulses are 2+ on the right side and 2+ on the left side.  Pulmonary:     Effort: Pulmonary effort is normal.     Breath sounds: Normal breath sounds and air entry. No stridor or transmitted upper airway sounds. No wheezing.     Comments: Spoke full sentences without difficulty; no cough observed in exam room Abdominal:     General: Abdomen is flat.  Musculoskeletal:        General: Normal range of motion.     Right hand: Normal strength. Normal capillary refill.     Left hand: Normal strength.  Normal capillary refill.     Cervical back: Normal range of motion and neck supple. No swelling, edema, deformity, erythema, signs of trauma, lacerations, rigidity, torticollis or crepitus. No pain with movement. Normal range of motion.     Thoracic back: No swelling, edema, deformity, signs of trauma or lacerations. Normal range of motion.  Lymphadenopathy:     Head:     Right side of head: No submandibular or preauricular adenopathy.     Left side of head: No submandibular or preauricular adenopathy.     Cervical: No cervical adenopathy.     Right cervical: No superficial cervical adenopathy.    Left cervical: No superficial cervical adenopathy.  Skin:    General: Skin is warm and dry.     Capillary Refill: Capillary refill takes less than 2 seconds.     Coloration: Skin is not ashen, cyanotic, jaundiced, mottled, pale or sallow.     Findings: No abrasion, abscess, acne, bruising, burn, ecchymosis, erythema, signs of injury, laceration, lesion, petechiae, rash or wound.     Nails: There is no clubbing.  Neurological:     General: No focal deficit present.     Mental Status: He is alert and oriented to person, place, and time. Mental status is at baseline.     GCS: GCS eye subscore is 4. GCS verbal subscore is 5. GCS motor subscore is 6.     Cranial Nerves: No cranial nerve deficit, dysarthria or facial asymmetry.     Sensory: Sensation is intact.     Motor: Motor function is intact. No weakness, tremor, atrophy, abnormal muscle tone or seizure activity.     Coordination: Coordination is intact. Coordination normal.     Gait: Gait is intact. Gait normal.     Comments: In/out of chair without difficulty; gait sure and steady in clinic; bilateral hand grasp equal 5/5  Psychiatric:        Attention and Perception: Attention and perception normal.        Mood and Affect: Mood and affect normal.        Speech: Speech normal.        Behavior: Behavior normal. Behavior is cooperative.         Thought Content: Thought content normal.        Cognition and Memory: Cognition and memory normal.        Judgment: Judgment normal.       Latest Reference Range & Units 06/05/22 08:59 06/16/22 09:02 10/23/22 15:00  BASIC METABOLIC PANEL  Rpt    Sodium 213 - 144 mmol/L 137    Potassium 3.5 - 5.2 mmol/L 4.8    Chloride 96 - 106 mmol/L 100    CO2 20 - 29 mmol/L 27    Glucose 70 - 99 mg/dL  92    BUN 6 - 24 mg/dL 7    Creatinine 4.09 - 1.27 mg/dL 8.11    Calcium 8.7 - 91.4 mg/dL 9.1    BUN/Creatinine Ratio 9 - 20  7 (L)    eGFR >59 mL/min/1.73 92    Phosphorus 2.8 - 4.1 mg/dL 3.6    Alkaline Phosphatase 44 - 121 IU/L 57    Albumin 3.8 - 4.9 g/dL 4.5    Albumin/Globulin Ratio 1.2 - 2.2  2.0    Uric Acid 3.8 - 8.4 mg/dL 5.3    AST 0 - 40 IU/L 23    ALT 0 - 44 IU/L 21    Total Protein 6.0 - 8.5 g/dL 6.8    Total Bilirubin 0.0 - 1.2 mg/dL 0.4    GGT 0 - 65 IU/L 24    Estimated CHD Risk 0.0 - 1.0 times avg. 1.0    LDH 121 - 224 IU/L 177    Total CHOL/HDL Ratio 0.0 - 5.0 ratio 4.8    Cholesterol, Total 100 - 199 mg/dL 782 (H)    HDL Cholesterol >39 mg/dL 44    Triglycerides 0 - 149 mg/dL 75    VLDL Cholesterol Cal 5 - 40 mg/dL 13    LDL Chol Calc (NIH) 0 - 99 mg/dL 956 (H)    Iron 38 - 213 ug/dL 91    Folate >0.8 ng/mL 12.1    Vitamin D, 25-Hydroxy 30.0 - 100.0 ng/mL 13.3 (L)  32.8  Vitamin B12 232 - 1,245 pg/mL 484  582  Vitamin B6 3.4 - 65.2 ug/L CANCELED 16.0 93.0 (H)  Globulin, Total 1.5 - 4.5 g/dL 2.3    WBC 3.4 - 65.7 Q46N6/EX x10E3/uL 2.9 (L) CANCELED    RBC 4.14 - 5.80 x10E6/uL x10E6/uL 4.81 CANCELED    Hemoglobin 13.0 - 17.7 g/dL g/dL 52.8 CANCELED    HCT 41.3 - 51.0 % % 44.7 CANCELED    MCV 79 - 97 fL fL 93 CANCELED    MCH 26.6 - 33.0 pg pg 30.8 CANCELED    MCHC 31.5 - 35.7 g/dL g/dL 24.4 CANCELED    RDW 01.0 - 15.4 % % 13.4 CANCELED    Platelets 150 - 450 x10E3/uL x10E3/uL 228 CANCELED    nRBC % CANCELED    Hematology Comments:  CANCELED     Neutrophils Not Estab. % % 37 CANCELED    Immature Granulocytes Not Estab. % % 0 CANCELED    NEUT# 1.4 - 7.0 x10E3/uL x10E3/uL 1.1 (L) CANCELED    Lymphocyte # 0.7 - 3.1 x10E3/uL x10E3/uL 1.3 CANCELED    Monocytes Absolute 0.1 - 0.9 x10E3/uL x10E3/uL 0.4 CANCELED    Basophils Absolute 0.0 - 0.2 x10E3/uL x10E3/uL 0.0 CANCELED    Immature Grans (Abs) 0.0 - 0.1 x10E3/uL x10E3/uL 0.0 CANCELED    Immature Cells  CANCELED    Lymphs Not Estab. % % 44 CANCELED    Monocytes Not Estab. % % 15 CANCELED    Basos Not Estab. % % 1 CANCELED    Eos Not Estab. % % 3 CANCELED    EOS (ABSOLUTE) 0.0 - 0.4 x10E3/uL x10E3/uL 0.1 CANCELED    Hemoglobin A1C 4.8 - 5.6 % 6.0 (H)  6.0 (H)  Est. average glucose Bld gHb Est-mCnc mg/dL 272    TSH 5.366 - 4.403 uIU/mL 2.100    Thyroxine (T4) 4.5 - 12.0 ug/dL 6.4    Free Thyroxine Index 1.2 - 4.9  1.5    T3 Uptake Ratio 24 -  39 % 24    Prostate Specific Ag, Serum 0.0 - 4.0 ng/mL 1.9    (L): Data is abnormally low (H): Data is abnormally high Rpt: View report in Results Review for more information     Assessment & Plan:   A-lip tremor  P-if reoccurs bmet nonfasting RN Kimrey and patient notified as on hydrochlorothiazide may have low potassium.  If drinking caffinated drinks stop and see if symptom resolves may have sensitivity to caffeine also.  Recent labs were done Hgba1c elevated.  Working on adjusting diet to lower blood sugar.  He is vegetarian.  Symptom could be related to hyper/hypoglycemia also Patient will continue to monitor to see if after caffeine/before or after meals onset.  Symptoms resolved at time of appt.  Exitcare handouts hypokalemia, caffeine tablets and hyper/hypoglycemia.  Patient verbalized understanding information/instructions, agreed with plan of care and had no further questions at this time.

## 2022-12-03 ENCOUNTER — Encounter: Payer: Self-pay | Admitting: Registered Nurse

## 2022-12-03 ENCOUNTER — Other Ambulatory Visit: Payer: Self-pay | Admitting: Occupational Medicine

## 2022-12-03 DIAGNOSIS — R251 Tremor, unspecified: Secondary | ICD-10-CM

## 2022-12-03 NOTE — Patient Instructions (Addendum)
Information on caffeine side effects below I do not recommend taking Caffeine Tablets but one tablet equal to a cup of coffee intake Hyperglycemia Hyperglycemia occurs when the level of sugar (glucose) in the blood is too high. Glucose is a type of sugar that provides the body's main source of energy. Certain hormones (insulin and glucagon) control the level of glucose in the blood. Insulin lowers blood glucose, and glucagon increases blood glucose. Hyperglycemia can result from not having enough insulin in the bloodstream, or from the body not responding normally to insulin. Hyperglycemia occurs most often in people who have diabetes (diabetes mellitus), but it can happen in people who do not have diabetes. It can develop quickly, and it can be life-threatening if it causes you to become severely dehydrated (diabetic ketoacidosis or hyperglycemic hyperosmolar state). Severe hyperglycemia is a medical emergency. For most people with diabetes, a blood glucose level above 240 mg/dL is considered hyperglycemia. What are the causes? If you have diabetes, hyperglycemia may be caused by: Medicines that increase blood glucose or affect your diabetes control. Getting less physical activity. Eating more than planned. Being sick or injured, having an infection, or having surgery. Stress. Not giving yourself enough insulin (if you are taking insulin). If you have undiagnosed diabetes, this may be the reason you have hyperglycemia. If you do not have diabetes, hyperglycemia may be caused by: Certain medicines, including: Steroid medicines. Beta-blockers. Epinephrine. Thiazide diuretics. Stress. Having a serious illness, an infection, or surgery. Diseases of the pancreas. What increases the risk? Hyperglycemia is more likely to develop in people who have risk factors for diabetes, such as: Having a family member with diabetes. Certain conditions in which the body's disease-fighting system (immune  system) attacks itself (autoimmune disorders). Being overweight or obese. Having an inactive (sedentary) lifestyle. Having been diagnosed with insulin resistance. Having a history of prediabetes, gestational diabetes, or polycystic ovarian syndrome (PCOS). What are the signs or symptoms? Hyperglycemia may not cause any symptoms. If you do have symptoms, they may include: Increased thirst. Needing to urinate more often than usual. Hunger. Feeling very tired. Blurry vision. Other symptoms may develop if hyperglycemia gets worse, such as: Dry mouth. Abdominal pain. Loss of appetite. Fruity-smelling breath. Weakness. Unexpected weight loss. Tingling or numbness in the hands or feet. Headache. Cuts or bruises that are slow to heal. How is this diagnosed? Hyperglycemia is diagnosed with a blood test to measure your blood glucose level. This blood test is usually done while you are having symptoms. Your health care provider may also do a physical exam and review your medical history. You may have more tests to determine the cause of your hyperglycemia, such as: A fasting blood glucose (FBG) test. You will not be allowed to eat (you will fast) for at least 8 hours before a blood sample is taken. An A1C blood test. This provides information about blood glucose control over the previous 2-3 months. An oral glucose tolerance test (OGTT). This measures your blood glucose at two times: After fasting. This is your baseline blood glucose level. 2 hours after drinking a beverage that contains glucose. How is this treated? Treatment depends on the cause of your hyperglycemia. Treatment may include: Taking medicine to regulate your blood glucose levels. If you take insulin or other diabetes medicines, your medicine or dosage may be adjusted. Lifestyle changes, such as exercising more, eating healthier foods, or losing weight. Treating an illness or infection. Checking your blood glucose more  often. Stopping or reducing steroid medicines.  If your hyperglycemia becomes severe and it results in diabetic ketoacidosis or hyperglycemic hyperosmolar state, you must be hospitalized and given IV fluids and IV insulin. Follow these instructions at home: General instructions Take over-the-counter and prescription medicines only as told by your health care provider. Do not use any products that contain nicotine or tobacco. These products include cigarettes, chewing tobacco, and vaping devices, such as e-cigarettes. If you need help quitting, ask your health care provider. If you drink alcohol: Limit how much you have to: 0-1 drink a day for women who are not pregnant. 0-2 drinks a day for men. Know how much alcohol is in a drink. In the [place] one drink equals one 12 oz bottle of beer (355 mL), one 5 oz glass of wine (148 mL), or one 1 oz glass of hard liquor (44 mL). Learn to manage stress. If you need help with this, ask your health care provider. Do exercises as told by your health care provider. Keep all follow-up visits. This is important. Eating and drinking  Maintain a healthy weight. Stay hydrated, especially when you exercise, get sick, or spend time in hot temperatures. Drink enough fluid to keep your urine pale yellow. If you have diabetes:  Know the symptoms of hyperglycemia. Follow your diabetes management plan as told by your health care provider. Make sure you: Take your insulin and medicines as told. Follow your exercise plan. Follow your meal plan. Eat on time, and do not skip meals. Check your blood glucose as often as told. Make sure to check your blood glucose before and after exercise. If you exercise longer or in a different way, check your blood glucose more often. Follow your sick day plan whenever you cannot eat or drink normally. Make this plan in advance with your health care provider. Share your diabetes management plan with people in your workplace, school,  and household. Check your urine for ketones when you are ill and as told by your health care provider. Carry a medical alert card or wear medical alert jewelry. Where to find more information American Diabetes Association: www.diabetes.org Contact a health care provider if: Your blood glucose is at or above 240 mg/dL (40.9 mmol/L) for 2 days in a row. You have problems keeping your blood glucose in your target range. You have frequent episodes of hyperglycemia. You have signs of illness, such as nausea, vomiting, or fever. Get help right away if: Your blood glucose monitor reads "high" even when you are taking insulin. You have trouble breathing. You have a change in how you think, feel, or act (mental status). You have nausea or vomiting that does not go away. These symptoms may represent a serious problem that is an emergency. Do not wait to see if the symptoms will go away. Get medical help right away. Call your local emergency services (911 in the [place] Do not drive yourself to the hospital. Summary Hyperglycemia occurs when the level of sugar (glucose) in the blood is too high. Hyperglycemia can happen with or without diabetes, and severe hyperglycemia can be life-threatening. Hyperglycemia is diagnosed with a blood test to measure your blood glucose level. This blood test is usually done while you are having symptoms. Your health care provider may also do a physical exam and review your medical history. If you have diabetes, follow your diabetes management plan as told by your health care provider. Contact your health care provider if you have problems keeping your blood glucose in your target range. This  information is not intended to replace advice given to you by your health care provider. Make sure you discuss any questions you have with your health care provider. Document Revised: 05/18/2020 Document Reviewed: 05/18/2020 Elsevier Patient Education  2023 [employer] Hypoglycemia Hypoglycemia occurs when the level of sugar (glucose) in the blood is too low. Hypoglycemia can happen in people who have or do not have diabetes. It can develop quickly, and it can be a medical emergency. For most people, a blood glucose level below 70 mg/dL (3.9 mmol/L) is considered hypoglycemia. Glucose is a type of sugar that provides the body's main source of energy. Certain hormones (insulin and glucagon) control the level of glucose in the blood. Insulin lowers blood glucose, and glucagon raises blood glucose. Hypoglycemia can result from having too much insulin in the bloodstream, or from not eating enough food that contains glucose. You may also have reactive hypoglycemia, which happens within 4 hours after eating a meal. What are the causes? Hypoglycemia occurs most often in people who have diabetes and may be caused by: Diabetes medicine. Not eating enough, or not eating often enough. Increased physical activity. Drinking alcohol on an empty stomach. If you do not have diabetes, hypoglycemia may be caused by: A tumor in the pancreas. Not eating enough, or not eating for long periods at a time (fasting). A severe infection or illness. Problems after having bariatric surgery. Organ failure, such as kidney or liver failure. Certain medicines. What increases the risk? Hypoglycemia is more likely to develop in people who: Have diabetes and take medicines to lower blood glucose. Abuse alcohol. Have a severe illness. What are the signs or symptoms? Symptoms vary depending on whether the condition is mild, moderate, or severe. Mild hypoglycemia Hunger. Sweating and feeling clammy. Dizziness or feeling light-headed. Sleepiness or restless sleep. Nausea. Increased heart rate. Headache. Blurry vision. Mood changes, such as irritability or anxiety. Tingling or numbness around the mouth, lips, or tongue. Moderate hypoglycemia Confusion and poor judgment. Behavior  changes. Weakness. Irregular heartbeat. A change in coordination. Severe hypoglycemia Severe hypoglycemia is a medical emergency. It can cause: Fainting. Seizures. Loss of consciousness (coma). Death. How is this diagnosed? Hypoglycemia is diagnosed with a blood test to measure your blood glucose level. This blood test is done while you are having symptoms. Your health care provider may also do a physical exam and review your medical history. How is this treated? This condition can be treated by immediately eating or drinking something that contains sugar with 15 grams of fast-acting carbohydrate, such as: 4 oz (120 mL) of fruit juice. 4 oz (120 mL) of regular soda (not diet soda). Several pieces of hard candy. Check food labels to find out how many pieces to eat for 15 grams. 1 Tbsp (15 mL) of sugar or honey. 4 glucose tablets. 1 tube of glucose gel. Treating hypoglycemia if you have diabetes If you are alert and able to swallow safely, follow the 15:15 rule: Take 15 grams of a fast-acting carbohydrate. Talk with your health care provider about how much you should take. Options for getting 15 grams of fast-acting carbohydrate include: Glucose tablets (take 4 tablets). Several pieces of hard candy. Check food labels to find out how many pieces to eat for 15 grams. 4 oz (120 mL) of fruit juice. 4 oz (120 mL) of regular soda (not diet soda). 1 Tbsp (15 mL) of sugar or honey. 1 tube of glucose gel. Check your blood glucose 15 minutes after you take  the carbohydrate. If the repeat blood glucose level is still at or below 70 mg/dL (3.9 mmol/L), take 15 grams of a carbohydrate again. If your blood glucose level does not increase above 70 mg/dL (3.9 mmol/L) after 3 tries, seek emergency medical care. After your blood glucose level returns to normal, eat a meal or a snack within 1 hour.  Treating severe hypoglycemia Severe hypoglycemia is when your blood glucose level is below 54 mg/dL (3  mmol/L). Severe hypoglycemia is a medical emergency. Get medical help right away. If you have severe hypoglycemia and you cannot eat or drink, you will need to be given glucagon. A family member or close friend should learn how to check your blood glucose and how to give you glucagon. Ask your health care provider if you need to have an emergency glucagon kit available. Severe hypoglycemia may need to be treated in a hospital. The treatment may include getting glucose through an IV. You may also need treatment for the cause of your hypoglycemia. Follow these instructions at home:  General instructions Take over-the-counter and prescription medicines only as told by your health care provider. Monitor your blood glucose as told by your health care provider. If you drink alcohol: Limit how much you have to: 0-1 drink a day for women who are not pregnant. 0-2 drinks a day for men. Know how much alcohol is in your drink. In the [place] one drink equals one 12 oz bottle of beer (355 mL), one 5 oz glass of wine (148 mL), or one 1 oz glass of hard liquor (44 mL). Be sure to eat food along with drinking alcohol. Be aware that alcohol is absorbed quickly and may have lingering effects that may result in hypoglycemia later. Be sure to do ongoing glucose monitoring. Keep all follow-up visits. This is important. If you have diabetes: Always have a fast-acting carbohydrate (15 grams) option with you to treat low blood glucose. Follow your diabetes management plan as directed by your health care provider. Make sure you: Know the symptoms of hypoglycemia. It is important to treat it right away to prevent it from becoming severe. Check your blood glucose as often as told. Always check before and after exercise. Always check your blood glucose before you drive a motorized vehicle. Take your medicines as told. Follow your meal plan. Eat on time, and do not skip meals. Share your diabetes management plan with  people in your workplace, school, and household. Carry a medical alert card or wear medical alert jewelry. Where to find more information American Diabetes Association: www.diabetes.org Contact a health care provider if: You have problems keeping your blood glucose in your target range. You have frequent episodes of hypoglycemia. Get help right away if: You continue to have hypoglycemia symptoms after eating or drinking something that contains 15 grams of fast-acting carbohydrate, and you cannot get your blood glucose above 70 mg/dL (3.9 mmol/L) while following the 15:15 rule. Your blood glucose is below 54 mg/dL (3 mmol/L). You have a seizure. You faint. These symptoms may represent a serious problem that is an emergency. Do not wait to see if the symptoms will go away. Get medical help right away. Call your local emergency services (911 in the [place] Do not drive yourself to the hospital. Summary Hypoglycemia occurs when the level of sugar (glucose) in the blood is too low. Hypoglycemia can happen in people who have or do not have diabetes. It can develop quickly, and it can be a medical emergency.  Make sure you know the symptoms of hypoglycemia and how to treat it. Always have a fast-acting carbohydrate option with you to treat low blood sugar. This information is not intended to replace advice given to you by your health care provider. Make sure you discuss any questions you have with your health care provider. Document Revised: 07/05/2020 Document Reviewed: 07/05/2020 Elsevier Patient Education  2023 [employer] Inc.  What is this medication? CAFFEINE (KAF een) may increase alertness. The FDA has not evaluated this supplement for any medical use. It may contain ingredients not listed. Discuss all supplements you are taking with your care team. They can provide you with important safety information. This medicine may be used for other purposes; ask your health care provider or pharmacist if  you have questions. COMMON BRAND NAME(S): NoDoz, Stay Awake, Vivarin What should I tell my care team before I take this medication? They need to know if you have any of these conditions: Anxiety Colitis Diabetes Heart disease or irregular heartbeat High blood pressure Insomnia Kidney disease Liver disease Panic attacks Peptic ulcer disease Seizure Thyroid disease An unusual or allergic reaction to caffeine, aminophylline, theophylline, other medications, foods, dyes, or preservatives Pregnant or trying to get pregnant Breast-feeding How should I use this medication? Take this supplement by mouth with a full glass of water. Take it as directed on the label. You can take it with or without food. If it upsets your stomach, take it with food. Talk to your care team about the use of this supplement in children. While it may be given to children as young as 12 years for selected conditions, precautions do apply. Overdosage: If you think you have taken too much of this medicine contact a poison control center or emergency room at once. NOTE: This medicine is only for you. Do not share this medicine with others. What if I miss a dose? If you miss a dose, take it as soon as you can. If it is almost time for your next dose, take only that dose. Do not take double or extra doses. What may interact with this medication? Do not take this medication with any of the following: MAOIs like Carbex, Eldepryl, Marplan, Nardil, and Parnate This medication may also interact with the following: Cimetidine Ketoconazole Ketoprofen Medications for colds or breathing difficulties Phenobarbital Phenytoin Stimulant medications for attention disorders, weight loss, or to stay awake Theophylline This list may not describe all possible interactions. Give your health care provider a list of all the medicines, herbs, non-prescription drugs, or dietary supplements you use. Also tell them if you smoke, drink  alcohol, or use illegal drugs. Some items may interact with your medicine. What should I watch for while using this medication? This supplement has about as much caffeine as a cup of coffee. Limit the use of caffeine-containing supplements, foods, or drinks while taking this product. Too much caffeine may cause you to feel nervous, irritable, or to have a rapid heart beat or trouble sleeping. This supplement is for occasional use only. It is not intended for use as a substitute for sleep. If fatigue or drowsiness continues, see your care team. If you have been taking this supplement regularly, you can get withdrawal symptoms when you stop taking it. You may feel tired, dizzy, nervous, or have a headache. Do not take this supplement close to when you want to sleep. Talk to your care team before you take any other over-the-counter medications, especially cold and allergy medications. Do not take this  supplement with grapefruit juice; this may increase the effects of caffeine. What side effects may I notice from receiving this medication? Side effects that you should report to your care team as soon as possible: Allergic reactions--skin rash, itching, hives, swelling of the face, lips, tongue, or throat Fast or irregular heartbeat Side effects that usually do not require medical attention (report to your care team if they continue or are bothersome): Anxiety, nervousness Headache Tremors or shaking This list may not describe all possible side effects. Call your doctor for medical advice about side effects. You may report side effects to FDA at 1-800-FDA-1088. Where should I keep my medication? Keep out of the reach of children and pets. Store at room temperature between 15 and 30 degrees C (59 and 86 degrees F). Avoid exposure to extreme heat. Protect from moisture. Get rid of any unused supplement after the expiration date. To get rid of medications that are no longer needed or have expired: Take  the medication to a medication take-back program. Check with your pharmacy or law enforcement to find a location. If you cannot return the medication, check the label or package insert to see if the medication should be thrown out in the garbage or flushed down the toilet. If you are not sure, ask your care team. If it is safe to put it in the trash, take the medication out of the container. Mix the medication with cat litter, dirt, coffee grounds, or other unwanted substance. Seal the mixture in a bag or container. Put it in the trash. NOTE: This sheet is a summary. It may not cover all possible information. If you have questions about this medicine, talk to your doctor, pharmacist, or health care provider.  2023 Elsevier/Gold Standard (2021-05-22 00:00:00) Hypokalemia Hypokalemia means that the amount of potassium in the blood is lower than normal. Potassium is a mineral (electrolyte) that helps regulate the amount of fluid in the body. It also stimulates muscle tightening (contraction) and helps nerves work properly. Normally, most of the body's potassium is inside cells, and only a very small amount is in the blood. Because the amount in the blood is so small, minor changes to potassium levels in the blood can be life-threatening. What are the causes? This condition may be caused by: Antibiotic medicine. Diarrhea or vomiting. Taking too much of a medicine that helps you have a bowel movement (laxative) can cause diarrhea and lead to hypokalemia. Chronic kidney disease (CKD). Medicines that help the body get rid of excess fluid (diuretics). Eating disorders, such as anorexia or bulimia. Low magnesium levels in the body. Sweating a lot. What are the signs or symptoms? Symptoms of this condition include: Weakness. Constipation. Fatigue. Muscle cramps. Mental confusion. Skipped heartbeats or irregular heartbeat (palpitations). Tingling or numbness. How is this diagnosed? This condition is  diagnosed with a blood test. How is this treated? This condition may be treated by: Taking potassium supplements. Adjusting the medicines that you take. Eating more foods that contain a lot of potassium. If your potassium level is very low, you may need to get potassium through an IV and be monitored in the hospital. Follow these instructions at home: Eating and drinking  Eat a healthy diet. A healthy diet includes fresh fruits and vegetables, whole grains, healthy fats, and lean proteins. If told, eat more foods that contain a lot of potassium. These include: Nuts, such as peanuts and pistachios. Seeds, such as sunflower seeds and pumpkin seeds. Peas, lentils, and lima beans. Whole  grain and bran cereals and breads. Fresh fruits and vegetables, such as apricots, avocado, bananas, cantaloupe, kiwi, oranges, tomatoes, asparagus, and potatoes. Juices, such as orange, tomato, and prune. Lean meats, including fish. Milk and milk products, such as yogurt. General instructions Take over-the-counter and prescription medicines only as told by your health care provider. This includes vitamins, natural food products, and supplements. Keep all follow-up visits. This is important. Contact a health care provider if: You have weakness that gets worse. You feel your heart pounding or racing. You vomit. You have diarrhea. You have diabetes and you have trouble keeping your blood sugar in your target range. Get help right away if: You have chest pain. You have shortness of breath. You have vomiting or diarrhea that lasts for more than 2 days. You faint. These symptoms may be an emergency. Get help right away. Call 911. Do not wait to see if the symptoms will go away. Do not drive yourself to the hospital. Summary Hypokalemia means that the amount of potassium in the blood is lower than normal. This condition is diagnosed with a blood test. Hypokalemia may be treated by taking potassium  supplements, adjusting the medicines that you take, or eating more foods that are high in potassium. If your potassium level is very low, you may need to get potassium through an IV and be monitored in the hospital. This information is not intended to replace advice given to you by your health care provider. Make sure you discuss any questions you have with your health care provider. Document Revised: 04/18/2021 Document Reviewed: 04/18/2021 Elsevier Patient Education  2023 [employer].

## 2022-12-03 NOTE — Progress Notes (Signed)
Lab drawn from Left AC tolerated well no issues noted.   

## 2022-12-04 LAB — BASIC METABOLIC PANEL
BUN/Creatinine Ratio: 12 (ref 9–20)
BUN: 10 mg/dL (ref 6–24)
CO2: 26 mmol/L (ref 20–29)
Calcium: 9.8 mg/dL (ref 8.7–10.2)
Chloride: 95 mmol/L — ABNORMAL LOW (ref 96–106)
Creatinine, Ser: 0.86 mg/dL (ref 0.76–1.27)
Glucose: 111 mg/dL — ABNORMAL HIGH (ref 70–99)
Potassium: 4.4 mmol/L (ref 3.5–5.2)
Sodium: 137 mmol/L (ref 134–144)
eGFR: 101 mL/min/{1.73_m2} (ref >59)

## 2022-12-05 ENCOUNTER — Encounter: Payer: Self-pay | Admitting: Registered Nurse

## 2022-12-05 ENCOUNTER — Telehealth: Payer: Self-pay | Admitting: Registered Nurse

## 2022-12-05 DIAGNOSIS — R251 Tremor, unspecified: Secondary | ICD-10-CM

## 2022-12-05 NOTE — Telephone Encounter (Signed)
Patient reported he had lip tremors at work Wed and Thursday.  He showed NP video recording of symptom minor lip trembling/twitching noted patient able to speak normally denied dysphagia/dysphasia/dyspnea.  Symptoms have resolved again now.  BMET drawn yesterday by RN Kimrey nonfasting normal patient notified.  Discussed monitor caffeine intake and if having symptoms when clinic open come see staff to have POCT glucose performed.  Patient A&Ox3 spoke full sentences without difficulty gait sure and steady respirations even and unlabored skin warm dry and pink.  Patient agreed with plan of care and had no further questions at this time, verbalized understanding information/instructions.

## 2023-01-03 NOTE — Telephone Encounter (Signed)
Patient reported all lip tremors have resolved and denied concerns or questions.  He stated feeling well.  Seen in workcenter A&Ox3 spoke full sentences without difficulty gait sure and steady respirations even and unlabored RA See lab results note 12/04/22 BMET

## 2023-01-21 ENCOUNTER — Other Ambulatory Visit: Payer: Self-pay | Admitting: Occupational Medicine

## 2023-01-21 DIAGNOSIS — R202 Paresthesia of skin: Secondary | ICD-10-CM

## 2023-01-21 DIAGNOSIS — R7303 Prediabetes: Secondary | ICD-10-CM

## 2023-01-21 DIAGNOSIS — E678 Other specified hyperalimentation: Secondary | ICD-10-CM

## 2023-01-21 NOTE — Progress Notes (Signed)
Lab drawn from Left AC tolerated well no issues noted.   

## 2023-01-22 ENCOUNTER — Other Ambulatory Visit: Payer: Self-pay | Admitting: Registered Nurse

## 2023-01-22 DIAGNOSIS — R7303 Prediabetes: Secondary | ICD-10-CM

## 2023-01-22 LAB — SPECIMEN STATUS

## 2023-01-26 LAB — SPECIMEN STATUS

## 2023-01-27 LAB — HEMOGLOBIN A1C
Est. average glucose Bld gHb Est-mCnc: 126 mg/dL
Hgb A1c MFr Bld: 6 % — ABNORMAL HIGH (ref 4.8–5.6)

## 2023-01-27 LAB — VITAMIN B6: Vitamin B6: 17.3 ug/L (ref 3.4–65.2)

## 2023-01-27 LAB — SPECIMEN STATUS REPORT

## 2023-03-09 NOTE — Progress Notes (Signed)
Per epic patient reviewed results/instructions 11/19/22 to continue vitamin D over the counter 2000 units daily with meal by mouth

## 2023-03-09 NOTE — Progress Notes (Signed)
Patient had follow up vitamin B labs levels also

## 2023-04-06 ENCOUNTER — Telehealth: Payer: Self-pay

## 2023-04-06 DIAGNOSIS — I10 Essential (primary) hypertension: Secondary | ICD-10-CM

## 2023-04-07 MED ORDER — HYDROCHLOROTHIAZIDE 25 MG PO TABS: 25.0000 mg | ORAL_TABLET | Freq: Every day | ORAL | Status: DC

## 2023-04-07 NOTE — Telephone Encounter (Signed)
Patient requested refill hydrochlorothiazide 25mg  po daily.  Last filled 10/09/22 90 tabs note taking every day.  Last labs  12/03/22  Latest Reference Range & Units 12/03/22 13:55  Sodium 134 - 144 mmol/L 137  Potassium 3.5 - 5.2 mmol/L 4.4  Chloride 96 - 106 mmol/L 95 (L)  CO2 20 - 29 mmol/L 26  Glucose 70 - 99 mg/dL 160 (H)  BUN 6 - 24 mg/dL 10  Creatinine 1.09 - 3.23 mg/dL 5.57  Calcium 8.7 - 32.2 mg/dL 9.8  BUN/Creatinine Ratio 9 - 20  12  eGFR >59 mL/min/1.73 101  (L): Data is abnormally low (H): Data is abnormally high     Latest Reference Range & Units 06/05/22 08:59  Sodium 134 - 144 mmol/L 137  Potassium 3.5 - 5.2 mmol/L 4.8  Chloride 96 - 106 mmol/L 100  CO2 20 - 29 mmol/L 27  Glucose 70 - 99 mg/dL 92  BUN 6 - 24 mg/dL 7  Creatinine 0.25 - 4.27 mg/dL 0.62  Calcium 8.7 - 37.6 mg/dL 9.1  BUN/Creatinine Ratio 9 - 20  7 (L)  eGFR >59 mL/min/1.73 92  Phosphorus 2.8 - 4.1 mg/dL 3.6  Alkaline Phosphatase 44 - 121 IU/L 57  Albumin 3.8 - 4.9 g/dL 4.5  Albumin/Globulin Ratio 1.2 - 2.2  2.0  Uric Acid 3.8 - 8.4 mg/dL 5.3  AST 0 - 40 IU/L 23  ALT 0 - 44 IU/L 21  Total Protein 6.0 - 8.5 g/dL 6.8  Total Bilirubin 0.0 - 1.2 mg/dL 0.4  GGT 0 - 65 IU/L 24  Estimated CHD Risk 0.0 - 1.0 times avg. 1.0  LDH 121 - 224 IU/L 177  Total CHOL/HDL Ratio 0.0 - 5.0 ratio 4.8  Cholesterol, Total 100 - 199 mg/dL 283 (H)  HDL Cholesterol >39 mg/dL 44  Triglycerides 0 - 151 mg/dL 75  VLDL Cholesterol Cal 5 - 40 mg/dL 13  LDL Chol Calc (NIH) 0 - 99 mg/dL 761 (H)  Iron 38 - 607 ug/dL 91  Folate >3.7 ng/mL 10.6  Vitamin D, 25-Hydroxy 30.0 - 100.0 ng/mL 13.3 (L)  Vitamin B12 232 - 1,245 pg/mL 484  Vitamin B6 ug/L CANCELED  Globulin, Total 1.5 - 4.5 g/dL 2.3  WBC 3.4 - 26.9 S85I6/EV x10E3/uL 2.9 (L) CANCELED  RBC 4.14 - 5.80 x10E6/uL x10E6/uL 4.81 CANCELED  Hemoglobin 13.0 - 17.7 g/dL g/dL 03.5 CANCELED  HCT 00.9 - 51.0 % % 44.7 CANCELED  MCV 79 - 97 fL fL 93 CANCELED  MCH 26.6  - 33.0 pg pg 30.8 CANCELED  MCHC 31.5 - 35.7 g/dL g/dL 38.1 CANCELED  RDW 82.9 - 15.4 % % 13.4 CANCELED  Platelets 150 - 450 x10E3/uL x10E3/uL 228 CANCELED  nRBC % CANCELED  Hematology Comments:   CANCELED  Neutrophils Not Estab. % % 37 CANCELED  Immature Granulocytes Not Estab. % % 0 CANCELED  NEUT# 1.4 - 7.0 x10E3/uL x10E3/uL 1.1 (L) CANCELED  Lymphocyte # 0.7 - 3.1 x10E3/uL x10E3/uL 1.3 CANCELED  Monocytes Absolute 0.1 - 0.9 x10E3/uL x10E3/uL 0.4 CANCELED  Basophils Absolute 0.0 - 0.2 x10E3/uL x10E3/uL 0.0 CANCELED  Immature Grans (Abs) 0.0 - 0.1 x10E3/uL x10E3/uL 0.0 CANCELED  Immature Cells   CANCELED  Lymphs Not Estab. % % 44 CANCELED  Monocytes Not Estab. % % 15 CANCELED  Basos Not Estab. % % 1 CANCELED  Eos Not Estab. % % 3 CANCELED  EOS (ABSOLUTE) 0.0 - 0.4 x10E3/uL x10E3/uL 0.1 CANCELED  Hemoglobin A1C 4.8 - 5.6 % 6.0 (H)  Est. average  glucose Bld gHb Est-mCnc mg/dL 010  TSH 2.725 - 3.664 uIU/mL 2.100  Thyroxine (T4) 4.5 - 12.0 ug/dL 6.4  Free Thyroxine Index 1.2 - 4.9  1.5  T3 Uptake Ratio 24 - 39 % 24  Prostate Specific Ag, Serum 0.0 - 4.0 ng/mL 1.9  (L): Data is abnormally low (H): Data is abnormally high   Patient to have BP and weight check with RN Chantel today  BP pending. Goal 135/85 or less.

## 2023-04-14 ENCOUNTER — Ambulatory Visit: Payer: No Typology Code available for payment source

## 2023-04-14 NOTE — Progress Notes (Signed)
Client in to pick up prescribed medications.  Has not taken BP medication for 5 days since he ran out of pills.  BP now 142/64.  Advised to continue taking BP meds as prescribed and see [place] in the clinic PRN. Reece Packer, RN, MPH, [facility]

## 2023-04-15 NOTE — Telephone Encounter (Signed)
Blood pressure slightly above goal 04/15/23 but had not taken hydrochlorothiazide in 5 days either.   BP 142/64 Important   (BP Location: Left Arm, Patient Position: Sitting, Cuff Size: Normal)   Pulse 64   SpO2 100%

## 2023-04-15 NOTE — Telephone Encounter (Signed)
Discussed with patient his PDRx medication available for pick up in clinic since last week and due for repeat BP/weight check with RN Burna Mortimer today.  Patient stated ran out of hydrochlorothiazide 5 days ago feeling well denied concerns and he stated he would come to clinic later today for blood pressure check.

## 2023-04-22 ENCOUNTER — Other Ambulatory Visit: Payer: No Typology Code available for payment source

## 2023-07-28 ENCOUNTER — Ambulatory Visit: Payer: No Typology Code available for payment source | Admitting: Registered Nurse

## 2023-07-30 ENCOUNTER — Encounter: Payer: Self-pay | Admitting: Registered Nurse

## 2023-07-30 ENCOUNTER — Ambulatory Visit: Payer: Self-pay | Admitting: Registered Nurse

## 2023-07-30 VITALS — BP 140/78 | HR 68 | Temp 98.0°F

## 2023-07-30 DIAGNOSIS — M79672 Pain in left foot: Secondary | ICD-10-CM

## 2023-07-30 NOTE — Patient Instructions (Signed)
Plantar Fasciitis Rehab Ask your health care provider which exercises are safe for you. Do exercises exactly as told by your health care provider and adjust them as directed. It is normal to feel mild stretching, pulling, tightness, or discomfort as you do these exercises. Stop right away if you feel sudden pain or your pain gets worse. Do not begin these exercises until told by your health care provider. Stretching and range-of-motion exercises These exercises warm up your muscles and joints and improve the movement and flexibility of your foot. These exercises also help to relieve pain. Plantar fascia stretch  Sit with your left / right leg crossed over your opposite knee. Hold your heel with one hand with that thumb near your arch. With your other hand, hold your toes and gently pull them back toward the top of your foot. You should feel a stretch on the base (bottom) of your toes, or the bottom of your foot (plantar fascia), or both. Hold this stretch for_____30_____ seconds. Slowly release your toes and return to the starting position. Repeat _____3_____ times. Complete this exercise ____2______ times a day. Gastrocnemius stretch, standing This exercise is also called a calf (gastroc) stretch. It stretches the muscles in the back of the upper calf. Stand with your hands against a wall. Extend your left / right leg behind you, and bend your front knee slightly. Keeping your heels on the floor, your toes facing forward, and your back knee straight, shift your weight toward the wall. Do not arch your back. You should feel a gentle stretch in your upper calf. Hold this position for ____30______ seconds. Repeat _____3_____ times. Complete this exercise _____2_____ times a day. Soleus stretch, standing This exercise is also called a calf (soleus) stretch. It stretches the muscles in the back of the lower calf. Stand with your hands against a wall. Extend your left / right leg behind you, and bend  your front knee slightly. Keeping your heels on the floor and your toes facing forward, bend your back knee and shift your weight slightly over your back leg. You should feel a gentle stretch deep in your lower calf. Hold this position for ______30____ seconds. Repeat ____3______ times. Complete this exercise __2________ times a day. Gastroc and soleus stretch, standing step This exercise stretches the muscles in the back of the lower leg. These muscles are in the upper calf (gastrocnemius) and the lower calf (soleus). Stand with the ball of your left / right foot on the front of a step. The ball of your foot is on the walking surface, right under your toes. Keep your other foot firmly on the same step. Hold on to the wall or a railing for balance. Slowly lift your other foot, allowing your body weight to press your heel down over the edge of the front of the step. Keep knee straight and unbent. You should feel a stretch in your calf. Hold this position for _____30_____ seconds. Return both feet to the step. Repeat this exercise with a slight bend in your left / right knee. Repeat _____3_____ times with your left / right knee straight and ____2______ times with your left / right knee bent. Complete this exercise __________ times a day. Balance exercise This exercise builds your balance and strength control of your arch to help take pressure off your plantar fascia. Single leg stand If this exercise is too easy, you can try it with your eyes closed or while standing on a pillow. Without shoes, stand near a  railing or in a doorway. You may hold on to the railing or door frame as needed. Stand on your left / right foot. Keep your big toe down on the floor and lift the arch of your foot. You should feel a stretch across the bottom of your foot and your arch. Do not let your foot roll inward. Hold this position for ______30____ seconds. Repeat ____3______ times. Complete this exercise ______2____  times a day. This information is not intended to replace advice given to you by your health care provider. Make sure you discuss any questions you have with your health care provider. Document Revised: 05/17/2020 Document Reviewed: 05/17/2020 Elsevier Patient Education  2024 [employer] Foot Pain Many things can cause foot pain. Common causes include injuries to the foot. The injuries include sprains or broken bones, or injuries that affect the nerves in the feet. Other causes of foot pain include arthritis, blisters, and bunions. To know what causes your foot pain, your health care provider will take a detailed history of your symptoms. They will also do a physical exam as well as imaging tests, such as X-ray or MRI. Follow these instructions at home: Managing pain, stiffness, and swelling  If told, put ice on the painful area. Put ice in a plastic bag. Place a towel between your skin and the bag. Leave the ice on for 20 minutes, 2-3 times a day. If your skin turns bright red, remove the ice right away to prevent skin damage. The risk of damage is higher if you cannot feel pain, heat, or cold. Activity Do not stand or walk for long periods. Do stretches to relieve foot pain and stiffness as told by your provider. Do not lift anything that is heavier than 10 lb (4.5 kg), or the limit that you are told, until your provider says that it is safe. Lifting a lot of weight can put added pressure on your feet. Return to your normal activities as told by your provider. Ask your provider what activities are safe for you. Lifestyle Wear comfortable, supportive shoes that fit you well. Do not wear high heels. Keep your feet clean and dry. General instructions Take over-the-counter and prescription medicines only as told by your provider. Rub your foot gently. Pay attention to any changes in your symptoms. Let your provider know if symptoms become worse. Keep all follow-up visits. Your provider  will want to monitor your progress. Contact a health care provider if: Your pain does not get better after a few days of treatment at home. Your pain gets worse. You cannot stand on your foot. Your foot or toes are swollen. Your foot is numb or tingling. Get help right away if: Your foot or toes turn white or blue. You have warmth and redness along your foot. This information is not intended to replace advice given to you by your health care provider. Make sure you discuss any questions you have with your health care provider. Document Revised: 08/28/2022 Document Reviewed: 05/06/2022 Elsevier Patient Education  2024 [employer].

## 2023-07-30 NOTE — Progress Notes (Signed)
Subjective:    Patient ID: Grant Wilson, male    DOB: 09-Aug-1966, 57 y.o.   MRN: [id number]  57y/o african [place] male established patient here for evaluation foot pain left.  Switched shoes, started stretches foot and lower leg and taking tylenol and ibuprofen OTC po prn and pain resolved today.  Wondering if it was his other shoes rubbing caused the problem.  Denied rash/wound/swelling/bruising.      Review of Systems  Constitutional:  Negative for chills and fever.  Musculoskeletal:  Positive for myalgias. Negative for gait problem.  Skin:  Negative for color change, pallor, rash and wound.  Neurological:  Negative for tremors, syncope, weakness and numbness.  Hematological:  Does not bruise/bleed easily.  Psychiatric/Behavioral:  Negative for agitation, confusion and sleep disturbance.        Objective:   Physical Exam Vitals and nursing note reviewed.  Constitutional:      General: He is awake. He is not in acute distress.    Appearance: Normal appearance. He is well-developed, well-groomed and normal weight. He is not ill-appearing, toxic-appearing or diaphoretic.  HENT:     Head: Normocephalic and atraumatic.     Jaw: There is normal jaw occlusion.     Salivary Glands: Right salivary gland is not diffusely enlarged. Left salivary gland is not diffusely enlarged.     Right Ear: Hearing and external ear normal.     Left Ear: Hearing and external ear normal.     Nose: Nose normal. No congestion or rhinorrhea.     Mouth/Throat:     Lips: Pink. No lesions.     Mouth: Mucous membranes are moist.     Pharynx: Oropharynx is clear.  Eyes:     General: Lids are normal. Vision grossly intact. Gaze aligned appropriately. No scleral icterus.       Right eye: No discharge.        Left eye: No discharge.     Extraocular Movements: Extraocular movements intact.     Conjunctiva/sclera: Conjunctivae normal.     Pupils: Pupils are equal, round, and reactive to light.  Neck:      Trachea: Trachea normal.  Cardiovascular:     Rate and Rhythm: Normal rate and regular rhythm.     Pulses: Normal pulses.          Dorsalis pedis pulses are 2+ on the left side.       Posterior tibial pulses are 2+ on the left side.  Pulmonary:     Effort: Pulmonary effort is normal. No respiratory distress.     Breath sounds: Normal breath sounds and air entry. No stridor or transmitted upper airway sounds. No wheezing.     Comments: Spoke full sentences without difficulty; no cough observed in exam room Abdominal:     General: Abdomen is flat.     Palpations: Abdomen is soft.  Musculoskeletal:        General: No swelling, tenderness, deformity or signs of injury. Normal range of motion.     Cervical back: Normal range of motion and neck supple. No rigidity.     Right lower leg: No edema.     Left lower leg: No edema.     Right foot: Normal range of motion.     Left foot: Normal range of motion. No deformity, bunion, Charcot foot or foot drop.  Feet:     Left foot:     Skin integrity: Callus and dry skin present. No ulcer, blister, skin breakdown,  erythema, warmth or fissure.     Toenail Condition: Left toenails are normal.  Lymphadenopathy:     Head:     Right side of head: No submandibular or preauricular adenopathy.     Left side of head: No submandibular or preauricular adenopathy.     Cervical: No cervical adenopathy.     Right cervical: No superficial cervical adenopathy.    Left cervical: No superficial cervical adenopathy.  Skin:    General: Skin is warm and dry.     Capillary Refill: Capillary refill takes less than 2 seconds.     Coloration: Skin is not ashen, cyanotic, jaundiced, mottled, pale or sallow.     Findings: Rash present. No abrasion, abscess, acne, bruising, burn, ecchymosis, erythema, signs of injury, laceration, lesion, petechiae or wound. Rash is scaling. Rash is not crusting, macular, nodular, papular, purpuric, pustular, urticarial or vesicular.      Nails: There is no clubbing.     Comments: Moccasin foot distribution fine scaling bilateral feet and callous sole of foot; shoes with mid foot and heel tread worn off bilaterally  Neurological:     General: No focal deficit present.     Mental Status: He is alert and oriented to person, place, and time. Mental status is at baseline.     GCS: GCS eye subscore is 4. GCS verbal subscore is 5. GCS motor subscore is 6.     Cranial Nerves: No cranial nerve deficit, dysarthria or facial asymmetry.     Sensory: Sensation is intact.     Motor: Motor function is intact. No weakness, tremor, atrophy, abnormal muscle tone or seizure activity.     Coordination: Coordination is intact. Coordination normal.     Gait: Gait is intact. Gait normal.     Comments: In/out of chair without difficulty; gait sure and steady in clinic; bilateral hand grasp equal 5/5  Psychiatric:        Attention and Perception: Attention and perception normal.        Mood and Affect: Mood and affect normal.        Speech: Speech normal.        Behavior: Behavior normal. Behavior is cooperative.        Thought Content: Thought content normal.        Cognition and Memory: Cognition and memory normal.        Judgment: Judgment normal.           Assessment & Plan:   A-acute left foot pain  P-History foot pain worsens after long day at work. Motrin 800mg  po TID prn pain or tylenol 1000mg  po q6h prn pain take at least once a day for two weeks.  [facility] handout on foot pain and plantar fasciitis rehab exercises printed and given to patient. Discussed achilles/gastrocnemius/foot/plantar stretches and icing 15 minutes at least nightly with frozen water bottle rolling foot over.  Writing alphabet with toes daily.  Discussed buy replacements shoes as treads almost completely worn off and have 2 pairs of shoes to alternate and not wear 1 pair daily to allow to dry out thoroughly expand.  Apply emollient to feet for dry skin/callous.   If no improvement with discussed care consider podiatry referral. Consider new supportive footwear/OTC inserts if shoe treads worn out/greater than 43 year old.  Do not walk barefoot at home or thin leather no support sandals/flip flops/shoes as this can worsen condition/pain.  Patient verbalized understanding of instructions, agreed with plan of care and had no further  questions at this time.

## 2023-08-18 ENCOUNTER — Encounter: Payer: Self-pay | Admitting: Registered Nurse

## 2023-08-18 ENCOUNTER — Ambulatory Visit: Payer: Self-pay | Admitting: Registered Nurse

## 2023-08-18 VITALS — BP 150/86 | HR 60 | Temp 98.6°F | Resp 16

## 2023-08-18 DIAGNOSIS — I10 Essential (primary) hypertension: Secondary | ICD-10-CM

## 2023-08-18 DIAGNOSIS — U071 COVID-19: Secondary | ICD-10-CM

## 2023-08-18 DIAGNOSIS — M542 Cervicalgia: Secondary | ICD-10-CM

## 2023-08-18 NOTE — Progress Notes (Signed)
 Subjective:    Patient ID: BEATRIZ SETTLES, male    DOB: 06/22/1966, 57 y.o.   MRN: [id number]  57y/o african american male established single with neck pain started this weekend  4/10 improved some yesterday and better today now past 24 hours congestion and sore throat  has not taken hydrochlorothiazide  yet today or home covid tested.  Denied known sick contacts fever/chills/n/v/d  Denied known injury/trauma/loss of bowel/bladder control, saddle paresthesias or arm leg weakness  Eating and drinking without difficulty     Review of Systems  Constitutional:  Negative for chills, diaphoresis and fever.  HENT:  Positive for congestion, postnasal drip, rhinorrhea and sore throat. Negative for nosebleeds, sinus pressure, sinus pain, trouble swallowing and voice change.   Eyes:  Negative for photophobia and visual disturbance.  Respiratory:  Negative for cough, choking, chest tightness, shortness of breath, wheezing and stridor.   Cardiovascular:  Negative for chest pain and palpitations.  Gastrointestinal:  Negative for diarrhea, nausea and vomiting.  Genitourinary:  Negative for difficulty urinating.  Musculoskeletal:  Positive for myalgias and neck pain. Negative for gait problem and neck stiffness.  Skin:  Negative for color change, pallor, rash and wound.  Neurological:  Positive for headaches. Negative for dizziness, tremors, seizures, syncope, facial asymmetry, speech difficulty, weakness, light-headedness and numbness.  Hematological:  Negative for adenopathy. Does not bruise/bleed easily.  Psychiatric/Behavioral:  Negative for agitation, confusion and sleep disturbance.        Objective:   Physical Exam Vitals reviewed.  Constitutional:      General: He is awake. He is not in acute distress.    Appearance: Normal appearance. He is well-developed, well-groomed and normal weight. He is not ill-appearing, toxic-appearing or diaphoretic.  HENT:     Head: Normocephalic and atraumatic.      Jaw: There is normal jaw occlusion.     Salivary Glands: Right salivary gland is not diffusely enlarged or tender. Left salivary gland is not diffusely enlarged or tender.     Right Ear: Hearing, ear canal and external ear normal. No decreased hearing noted. No laceration, drainage, swelling or tenderness. A middle ear effusion is present. There is no impacted cerumen. No foreign body. No mastoid tenderness. No PE tube. No hemotympanum. Tympanic membrane is not injected, perforated, erythematous, retracted or bulging.     Left Ear: Hearing, ear canal and external ear normal. No decreased hearing noted. No laceration, drainage, swelling or tenderness. A middle ear effusion is present. There is no impacted cerumen. No foreign body. No mastoid tenderness. No PE tube. No hemotympanum. Tympanic membrane is not injected, perforated, erythematous or retracted.     Ears:     Comments: Bilateral TMs intact air fluid level clear no debris in auditory canals    Nose: Mucosal edema, congestion and rhinorrhea present. No nasal deformity, signs of injury or laceration. Rhinorrhea is clear.     Right Nostril: No epistaxis.     Left Nostril: No epistaxis.     Right Turbinates: Not enlarged, swollen or pale.     Left Turbinates: Not enlarged, swollen or pale.     Right Sinus: No maxillary sinus tenderness or frontal sinus tenderness.     Left Sinus: No maxillary sinus tenderness or frontal sinus tenderness.     Mouth/Throat:     Lips: Pink. No lesions.     Mouth: Mucous membranes are moist. No oral lesions or angioedema.     Dentition: No gum lesions.     Tongue: No  lesions. Tongue does not deviate from midline.     Palate: No mass and lesions.     Pharynx: Uvula midline. Pharyngeal swelling, posterior oropharyngeal erythema and postnasal drip present. No oropharyngeal exudate or uvula swelling.     Tonsils: No tonsillar exudate.     Comments: Cobblestoning posterior pharynx; bilateral allergic shiners;  macular erythema oropharynx; nasal congestion and sniffing audible in clinic Eyes:     General: Lids are normal. Vision grossly intact. Gaze aligned appropriately. Allergic shiner present. No scleral icterus.       Right eye: No discharge.        Left eye: No discharge.     Extraocular Movements: Extraocular movements intact.     Conjunctiva/sclera: Conjunctivae normal.     Right eye: Right conjunctiva is not injected.     Left eye: Left conjunctiva is not injected.     Pupils: Pupils are equal, round, and reactive to light.  Neck:     Trachea: Trachea and phonation normal.     Comments: Bilateral trapezius and sternocleidomastoids tight; arom at patient baseline c-spine Cardiovascular:     Rate and Rhythm: Normal rate and regular rhythm.     Pulses: Normal pulses.          Radial pulses are 2+ on the right side and 2+ on the left side.     Heart sounds: Normal heart sounds, S1 normal and S2 normal. No murmur heard. Pulmonary:     Effort: Pulmonary effort is normal. No respiratory distress.     Breath sounds: Normal breath sounds and air entry. No stridor, decreased air movement or transmitted upper airway sounds. No decreased breath sounds, wheezing, rhonchi or rales.     Comments: Spoke full sentences without difficulty; no cough observed in clinic Abdominal:     General: Abdomen is flat.     Palpations: Abdomen is soft.  Musculoskeletal:        General: Tenderness present. No swelling, deformity or signs of injury. Normal range of motion.     Right hand: Normal strength. Normal capillary refill.     Left hand: Normal strength. Normal capillary refill.     Cervical back: Normal range of motion and neck supple. No swelling, edema, deformity, erythema, signs of trauma, lacerations, rigidity, spasms, torticollis, tenderness, bony tenderness or crepitus. Pain with movement and muscular tenderness present. Normal range of motion.     Thoracic back: No swelling, edema, deformity, signs of  trauma, lacerations, spasms or tenderness. Normal range of motion.     Lumbar back: No swelling, edema, deformity, signs of trauma, lacerations or spasms. Normal range of motion.     Right lower leg: No edema.     Left lower leg: No edema.  Lymphadenopathy:     Head:     Right side of head: No submental, submandibular, tonsillar, preauricular, posterior auricular or occipital adenopathy.     Left side of head: No submandibular, tonsillar, preauricular, posterior auricular or occipital adenopathy.     Cervical: No cervical adenopathy.     Right cervical: No superficial, deep or posterior cervical adenopathy.    Left cervical: No superficial, deep or posterior cervical adenopathy.  Skin:    General: Skin is warm and dry.     Capillary Refill: Capillary refill takes less than 2 seconds.     Coloration: Skin is not ashen, cyanotic, jaundiced, mottled, pale or sallow.     Findings: No abrasion, abscess, acne, bruising, burn, ecchymosis, erythema, signs of injury, laceration, lesion,  petechiae, rash or wound.     Nails: There is no clubbing.  Neurological:     General: No focal deficit present.     Mental Status: He is alert and oriented to person, place, and time. Mental status is at baseline.     GCS: GCS eye subscore is 4. GCS verbal subscore is 5. GCS motor subscore is 6.     Cranial Nerves: No cranial nerve deficit, dysarthria or facial asymmetry.     Sensory: Sensation is intact. No sensory deficit.     Motor: Motor function is intact. No weakness, tremor, atrophy, abnormal muscle tone or seizure activity.     Coordination: Coordination is intact. Coordination normal.     Gait: Gait normal.     Comments: In/out of chair without difficulty; gait sure and steady normal heel toe gait bilateral hand grasp equal  Psychiatric:        Attention and Perception: Attention and perception normal.        Mood and Affect: Mood and affect normal.        Speech: Speech normal.        Behavior:  Behavior normal. Behavior is cooperative.        Thought Content: Thought content normal.        Cognition and Memory: Cognition and memory normal.        Judgment: Judgment normal.      Home covid test results positive 10 minutes after starting.  Discussed with patient neck pain/body aches/headache most likely related to covid infection and inflammation typically related to infection.  Can continue plan of care as previously discussed.  Patient notified will need to go home and HR/supervisor notified excused absence per communicable disease policy.  Patient notified will contact him via telephone tomorrow for re-evaluation.  He does not want covid antivirals at this time.  Denied known sick contacts. Denied close contacts in previous 48hours at work e.g. no mask greater than 15 minutes within 6 feet face to face contact.  Pt began quarantine at that time. Patient did not develop symptoms of  trouble breathing, chest pain, nausea, vomiting, diarrhea, fever or chills.   quarantine per CDC recommendations until afebrile and symptoms improving.  Due to new symptoms in previous 24 hours sent home today from work. Day 1 of quarantine was 08/19/2023. Patient to contact clinic staff if vomiting after coughing or unable to tolerate po fluids.  Discussed flu and other viral illnesses circulating in community and some causing GI upset.  If GI upset I have recommended clear fluids then bland diet.  Avoid dairy/spicy, fried and large portions of meat while having nausea.  If vomiting hold po intake x 1 hour.  Then sips clear fluids like broths, ginger ale, power ade, gatorade, pedialyte may advance to soft/bland if no vomiting x 24 hours and appetite returned otherwise hydration main focus. Call me at work from home number if symptoms not improved with plan of care  patient to call if high fever, dehydration, marked weakness, fainting, increased abdominal pain, blood in stool or vomit (red or black).     Reviewed  possible Covid symptoms including cough, shortness of breath with exertion or at rest, runny nose, congestion, sinus pain/pressure, sore throat, fever/chills, body aches, fatigue, loss of taste/smell, GI symptoms of nausea/vomiting/diarrhea. Also reviewed same day/emergent eval/ER precautions of dizziness/syncope, confusion, blue tint to lips/face, severe shortness of breath/difficulty breathing/wheezing. Patient to isolate in own room and if possible use only one bathroom if living  with others in home.  Wear mask when out of room to help prevent spread to others in household.  Sanitize high touch surfaces with lysol/chlorox/bleach spray or wipes daily as viruses are known to live on surfaces from 24 hours to days.  Patient stated lives alone.   Patient at higher risk for hospitalization due to  hypertension, prediabetes, and age.  Patient is not up to date on covid vaccines. Recommend annual booster or to start series 60 days after infection resolution.  Patient is on prescription medications or daily medications. If taking medications interaction checker used to verify if any drug interactions. Only taking OTC cough/cold/fever medication at this time dayquil/nyquil/honey. I recommended not having sex with anyone while sick/testing positive/10 day quarantine as could spread virus to partner.  Exitcare handouts on covid quarantine/home care   Dayquil and nyquil per manufacturer instructions.. honey 1 tablespoon every 4 hours is a natural cough suppressant but caution due to his diabetes.  Avoid dehydration and drink water to keep urine pale yellow clear and voiding every 2-4 hours while awake.  Discussed with patient can contact NP Ellouise through my [email or web address]  when clinic closed if questions or concerns until clinic reopens 19 Aug 798 closed tomorrow due to federal holiday.   Pt verbalized understanding and agreement with plan of care. No further questions/concerns at this time. Pt reminded to  contact clinic with any changes in symptoms or questions/concerns. HR notified patient strict mask wear through Day 10 and no eating in employee lunch room.  Estimated return to work onsite 08/20/2023 re-evaluation 08/19/23  Supervisor notified of excused absence.      Assessment & Plan:  A-viral illness, lab test positive for covid, elevated blood pressure with diagnosis of hypertension, acute neck pain  P-given 1 free [place]  govt home Covid test to perform prior to returning to workcenter.  Patient stated going to go eat lunch not in lunch room at work.  Given 1 Thermacare neck patch from clinic stock assisted with application.  Did not want biofreeze at this time.  Tylenol  1000mg  po q6h prn pain given 4 UD from clinic stock prn pain.  Given 1 bottle nasal saline from clinic stock for prn use.  Patient may use normal saline nasal spray 2 sprays each nostril q2h wa as needed. flonase  50mcg 1 spray each nostril BID OTC  OTC antihistamine of choice claritin /zyrtec 10mg  po daily.  Avoid triggers if possible.  Shower prn congestion  hydrate with water to keep urine pale yellow clear and voiding every 2-4 hours while awake.  Avoid dehydration.  Discussed honey 1 tablespoon every 4 hours prn sore throat/cough.   Call or return to clinic as needed if these symptoms worsen or fail to improve as anticipated.   Exitcare handout on allergic rhinitis and sinus rinse given to patient.  Patient verbalized understanding of instructions, agreed with plan of care and had no further questions at this time.  P2:  Avoidance and hand washing.   Pain and Viral illness can raise blood pressure.  Avoid caffeine this afternoon and salty foods/added salt.  Take his bp medicine hydrochlorothiazide  25mg  po daily when he returns to desk as has not taken yet today  Discussed headache most likely related to blood pressure and viral illness.  ER if chest pain/worst headache of his life or visual changes. Exitcare handout managing hypertension.  Patient verbalized understanding information/instructions, agreed with plan of care and had no further questions at this time.  Ibuprofen  800mg  po TID prn  pain take with food.  Tylenol  1000mg  po q6h prn pain.  Home stretches demonstrated to patient-e.g. Arm circles, walking up wall, chest stretches, neck AROM, chin tucks, knee to chest and rock side to side on back. Self massage or professional prn, foam roller use or tennis/racquetball.  Heat/cryotherapy 15 minutes QID prn.  Trial thermacare 1 applied and another given to patient for use tomorrow from clinic stock.  Consider physical therapy referral if no improvement with prescribed therapy from [facility] and/or chiropractic care.  Ensure ergonomics correct desk at work avoid repetitive motions if possible/holding phone/laptop in hand use desk/stand and/or break up lifting items into smaller loads/weights.  Patient was instructed to rest, ice, and ROM exercises.  Activity as tolerated.   Follow up if symptoms persist or worsen especially if loss of bowel/bladder control, arm/leg weakness and/or saddle paresthesias same day re-eval with a provider.  Exitcare handout on neck exercises.  Patient verbalized agreement and understanding of treatment plan and had no further questions at this time.  P2:  Injury Prevention and Fitness.

## 2023-08-18 NOTE — Patient Instructions (Addendum)
 Person Under Monitoring Name: Grant Wilson  Location: [address] [place] [address]   Infection Prevention Recommendations for Individuals Confirmed to have, or Being Evaluated for, 2019 Novel Coronavirus (COVID-19) Infection Who Receive Care at Home  Individuals who are confirmed to have, or are being evaluated for, COVID-19 should follow the prevention steps below until a healthcare provider or local or state health department says they can return to normal activities.  Stay home except to get medical care You should restrict activities outside your home, except for getting medical care. Do not go to work, school, or public areas, and do not use public transportation or taxis.  Call ahead before visiting your doctor Before your medical appointment, call the healthcare provider and tell them that you have, or are being evaluated for, COVID-19 infection. This will help the healthcare provider's office take steps to keep other people from getting infected. Ask your healthcare provider to call the local or state health department.  Monitor your symptoms Seek prompt medical attention if your illness is worsening (e.g., difficulty breathing). Before going to your medical appointment, call the healthcare provider and tell them that you have, or are being evaluated for, COVID-19 infection. Ask your healthcare provider to call the local or state health department.  Wear a facemask You should wear a facemask that covers your nose and mouth when you are in the same room with other people and when you visit a healthcare provider. People who live with or visit you should also wear a facemask while they are in the same room with you.  Separate yourself from other people in your home As much as possible, you should stay in a different room from other people in your home. Also, you should use a separate bathroom, if available.  Avoid sharing household items You should not  share dishes, drinking glasses, cups, eating utensils, towels, bedding, or other items with other people in your home. After using these items, you should wash them thoroughly with soap and water.  Cover your coughs and sneezes Cover your mouth and nose with a tissue when you cough or sneeze, or you can cough or sneeze into your sleeve. Throw used tissues in a lined trash can, and immediately wash your hands with soap and water for at least 20 seconds or use an alcohol-based hand rub.  Wash your [employer] your hands often and thoroughly with soap and water for at least 20 seconds. You can use an alcohol-based hand sanitizer if soap and water are not available and if your hands are not visibly dirty. Avoid touching your eyes, nose, and mouth with unwashed hands.   Prevention Steps for Caregivers and Household Members of Individuals Confirmed to have, or Being Evaluated for, COVID-19 Infection Being Cared for in the Home  If you live with, or provide care at home for, a person confirmed to have, or being evaluated for, COVID-19 infection please follow these guidelines to prevent infection:  Follow healthcare provider's instructions Make sure that you understand and can help the patient follow any healthcare provider instructions for all care.  Provide for the patient's basic needs You should help the patient with basic needs in the home and provide support for getting groceries, prescriptions, and other personal needs.  Monitor the patient's symptoms If they are getting sicker, call his or her medical provider and tell them that the patient has, or is being evaluated for, COVID-19 infection. This will help the healthcare provider's  office take steps to keep other people from getting infected. Ask the healthcare provider to call the local or state health department.  Limit the number of people who have contact with the patient If possible, have only one caregiver for the  patient. Other household members should stay in another home or place of residence. If this is not possible, they should stay in another room, or be separated from the patient as much as possible. Use a separate bathroom, if available. Restrict visitors who do not have an essential need to be in the home.  Keep older adults, very young children, and other sick people away from the patient Keep older adults, very young children, and those who have compromised immune systems or chronic health conditions away from the patient. This includes people with chronic heart, lung, or kidney conditions, diabetes, and cancer.  Ensure good ventilation Make sure that shared spaces in the home have good air flow, such as from an air conditioner or an opened window, weather permitting.  Wash your hands often Wash your hands often and thoroughly with soap and water for at least 20 seconds. You can use an alcohol based hand sanitizer if soap and water are not available and if your hands are not visibly dirty. Avoid touching your eyes, nose, and mouth with unwashed hands. Use disposable paper towels to dry your hands. If not available, use dedicated cloth towels and replace them when they become wet.  Wear a facemask and gloves Wear a disposable facemask at all times in the room and gloves when you touch or have contact with the patient's blood, body fluids, and/or secretions or excretions, such as sweat, saliva, sputum, nasal mucus, vomit, urine, or feces.  Ensure the mask fits over your nose and mouth tightly, and do not touch it during use. Throw out disposable facemasks and gloves after using them. Do not reuse. Wash your hands immediately after removing your facemask and gloves. If your personal clothing becomes contaminated, carefully remove clothing and launder. Wash your hands after handling contaminated clothing. Place all used disposable facemasks, gloves, and other waste in a lined container before  disposing them with other household waste. Remove gloves and wash your hands immediately after handling these items.  Do not share dishes, glasses, or other household items with the patient Avoid sharing household items. You should not share dishes, drinking glasses, cups, eating utensils, towels, bedding, or other items with a patient who is confirmed to have, or being evaluated for, COVID-19 infection. After the person uses these items, you should wash them thoroughly with soap and water.  Wash laundry thoroughly Immediately remove and wash clothes or bedding that have blood, body fluids, and/or secretions or excretions, such as sweat, saliva, sputum, nasal mucus, vomit, urine, or feces, on them. Wear gloves when handling laundry from the patient. Read and follow directions on labels of laundry or clothing items and detergent. In general, wash and dry with the warmest temperatures recommended on the label.  Clean all areas the individual has used often Clean all touchable surfaces, such as counters, tabletops, doorknobs, bathroom fixtures, toilets, phones, keyboards, tablets, and bedside tables, every day. Also, clean any surfaces that may have blood, body fluids, and/or secretions or excretions on them. Wear gloves when cleaning surfaces the patient has come in contact with. Use a diluted bleach solution (e.g., dilute bleach with 1 part bleach and 10 parts water) or a household disinfectant with a label that says EPA-registered for coronaviruses. To make a  bleach solution at home, add 1 tablespoon of bleach to 1 quart (4 cups) of water. For a larger supply, add  cup of bleach to 1 gallon (16 cups) of water. Read labels of cleaning products and follow recommendations provided on product labels. Labels contain instructions for safe and effective use of the cleaning product including precautions you should take when applying the product, such as wearing gloves or eye protection and making sure you  have good ventilation during use of the product. Remove gloves and wash hands immediately after cleaning.  Monitor yourself for signs and symptoms of illness Caregivers and household members are considered close contacts, should monitor their health, and will be asked to limit movement outside of the home to the extent possible. Follow the monitoring steps for close contacts listed on the symptom monitoring form.   ? If you have additional questions, contact your local health department or call the epidemiologist on call at [phone] (available 24/7). ? This guidance is subject to change. For the most up-to-date guidance from [facility], please refer to their website: tripmetro.hu  Neck Exercises Ask your health care provider which exercises are safe for you. Do exercises exactly as told by your health care provider and adjust them as directed. It is normal to feel mild stretching, pulling, tightness, or discomfort as you do these exercises. Stop right away if you feel sudden pain or your pain gets worse. Do not begin these exercises until told by your health care provider. Neck exercises can be important for many reasons. They can improve strength and maintain flexibility in your neck, which will help your upper back and prevent neck pain. Stretching exercises Rotation neck stretching  Sit in a chair or stand up. Place your feet flat on the floor, shoulder-width apart. Slowly turn your head (rotate) to the right until a slight stretch is felt. Turn it all the way to the right so you can look over your right shoulder. Do not tilt or tip your head. Hold this position for 10-30 seconds. Slowly turn your head (rotate) to the left until a slight stretch is felt. Turn it all the way to the left so you can look over your left shoulder. Do not tilt or tip your head. Hold this position for 10-30 seconds. Repeat ___3_______ times. Complete  this exercise ___2_______ times a day. Neck retraction  Sit in a sturdy chair or stand up. Look straight ahead. Do not bend your neck. Use your fingers to push your chin backward (retraction). Do not bend your neck for this movement. Continue to face straight ahead. If you are doing the exercise properly, you will feel a slight sensation in your throat and a stretch at the back of your neck. Hold the stretch for 1-2 seconds. Repeat ____3______ times. Complete this exercise _____2_____ times a day. Strengthening exercises Neck press  Lie on your back on a firm bed or on the floor with a pillow under your head. Use your neck muscles to push your head down on the pillow and straighten your spine. Hold the position as well as you can. Keep your head facing up (in a neutral position) and your chin tucked. Slowly count to 5 while holding this position. Repeat ____3______ times. Complete this exercise _____2_____ times a day. Isometrics These are exercises in which you strengthen the muscles in your neck while keeping your neck still (isometrics). Sit in a supportive chair and place your hand on your forehead. Keep your head and face facing straight  ahead. Do not flex or extend your neck while doing isometrics. Push forward with your head and neck while pushing back with your hand. Hold for 10 seconds. Do the sequence again, this time putting your hand against the back of your head. Use your head and neck to push backward against the hand pressure. Finally, do the same exercise on either side of your head, pushing sideways against the pressure of your hand. Repeat ______3____ times. Complete this exercise _____2_____ times a day. Prone head lifts  Lie face-down (prone position), resting on your elbows so that your chest and upper back are raised. Start with your head facing downward, near your chest. Position your chin either on or near your chest. Slowly lift your head upward. Lift until you  are looking straight ahead. Then continue lifting your head as far back as you can comfortably stretch. Hold your head up for 5 seconds. Then slowly lower it to your starting position. Repeat ____3______ times. Complete this exercise ______2____ times a day. Supine head lifts  Lie on your back (supine position), bending your knees to point to the ceiling and keeping your feet flat on the floor. Lift your head slowly off the floor, raising your chin toward your chest. Hold for 5 seconds. Repeat ____3______ times. Complete this exercise _____2_____ times a day. Scapular retraction  Stand with your arms at your sides. Look straight ahead. Slowly pull both shoulders (scapulae) backward and downward (retraction) until you feel a stretch between your shoulder blades in your upper back. Hold for 10-30 seconds. Relax and repeat. Repeat ____3______ times. Complete this exercise ______2____ times a day. Contact a health care provider if: Your neck pain or discomfort gets worse when you do an exercise. Your neck pain or discomfort does not improve within 2 hours after you exercise. If you have any of these problems, stop exercising right away. Do not do the exercises again unless your health care provider says that you can. Get help right away if: You develop sudden, severe neck pain. If this happens, stop exercising right away. Do not do the exercises again unless your health care provider says that you can. This information is not intended to replace advice given to you by your health care provider. Make sure you discuss any questions you have with your health care provider. Document Revised: 01/29/2021 Document Reviewed: 01/29/2021 Elsevier Patient Education  2024 [employer]   Managing Your Hypertension Hypertension, also called high blood pressure, is when the force of the blood pressing against the walls of the arteries is too strong. Arteries are blood vessels that carry blood from your  heart throughout your body. Hypertension forces the heart to work harder to pump blood and may cause the arteries to become narrow or stiff. Understanding blood pressure readings A blood pressure reading includes a higher number over a lower number: The first, or top, number is called the systolic pressure. It is a measure of the pressure in your arteries as your heart beats. The second, or bottom number, is called the diastolic pressure. It is a measure of the pressure in your arteries as the heart relaxes. For most people, a normal blood pressure is below 120/80. Your personal target blood pressure may vary depending on your medical conditions, your age, and other factors. Blood pressure is classified into four stages. Based on your blood pressure reading, your health care provider may use the following stages to determine what type of treatment you need, if any. Systolic pressure and  diastolic pressure are measured in a unit called millimeters of mercury (mmHg). Normal Systolic pressure: below 120. Diastolic pressure: below 80. Elevated Systolic pressure: 120-129. Diastolic pressure: below 80. Hypertension stage 1 Systolic pressure: 130-139. Diastolic pressure: 80-89. Hypertension stage 2 Systolic pressure: 140 or above. Diastolic pressure: 90 or above. How can this condition affect me? Managing your hypertension is very important. Over time, hypertension can damage the arteries and decrease blood flow to parts of the body, including the brain, heart, and kidneys. Having untreated or uncontrolled hypertension can lead to: A heart attack. A stroke. A weakened blood vessel (aneurysm). Heart failure. Kidney damage. Eye damage. Memory and concentration problems. Vascular dementia. What actions can I take to manage this condition? Hypertension can be managed by making lifestyle changes and possibly by taking medicines. Your health care provider will help you make a plan to bring your  blood pressure within a normal range. You may be referred for counseling on a healthy diet and physical activity. Nutrition  Eat a diet that is high in fiber and potassium, and low in salt (sodium), added sugar, and fat. An example eating plan is called the DASH diet. DASH stands for Dietary Approaches to Stop Hypertension. To eat this way: Eat plenty of fresh fruits and vegetables. Try to fill one-half of your plate at each meal with fruits and vegetables. Eat whole grains, such as whole-wheat pasta, brown rice, or whole-grain bread. Fill about one-fourth of your plate with whole grains. Eat low-fat dairy products. Avoid fatty cuts of meat, processed or cured meats, and poultry with skin. Fill about one-fourth of your plate with lean proteins such as fish, chicken without skin, beans, eggs, and tofu. Avoid pre-made and processed foods. These tend to be higher in sodium, added sugar, and fat. Reduce your daily sodium intake. Many people with hypertension should eat less than 1,500 mg of sodium a day. Lifestyle  Work with your health care provider to maintain a healthy body weight or to lose weight. Ask what an ideal weight is for you. Get at least 30 minutes of exercise that causes your heart to beat faster (aerobic exercise) most days of the week. Activities may include walking, swimming, or biking. Include exercise to strengthen your muscles (resistance exercise), such as weight lifting, as part of your weekly exercise routine. Try to do these types of exercises for 30 minutes at least 3 days a week. Do not use any products that contain nicotine or tobacco. These products include cigarettes, chewing tobacco, and vaping devices, such as e-cigarettes. If you need help quitting, ask your health care provider. Control any long-term (chronic) conditions you have, such as high cholesterol or diabetes. Identify your sources of stress and find ways to manage stress. This may include meditation, deep  breathing, or making time for fun activities. Alcohol use Do not drink alcohol if: Your health care provider tells you not to drink. You are pregnant, may be pregnant, or are planning to become pregnant. If you drink alcohol: Limit how much you have to: 0-1 drink a day for women. 0-2 drinks a day for men. Know how much alcohol is in your drink. In the [place] one drink equals one 12 oz bottle of beer (355 mL), one 5 oz glass of wine (148 mL), or one 1 oz glass of hard liquor (44 mL). Medicines Your health care provider may prescribe medicine if lifestyle changes are not enough to get your blood pressure under control and if: Your systolic blood pressure  is 130 or higher. Your diastolic blood pressure is 80 or higher. Take medicines only as told by your health care provider. Follow the directions carefully. Blood pressure medicines must be taken as told by your health care provider. The medicine does not work as well when you skip doses. Skipping doses also puts you at risk for problems. Monitoring Before you monitor your blood pressure: Do not smoke, drink caffeinated beverages, or exercise within 30 minutes before taking a measurement. Use the bathroom and empty your bladder (urinate). Sit quietly for at least 5 minutes before taking measurements. Monitor your blood pressure at home as told by your health care provider. To do this: Sit with your back straight and supported. Place your feet flat on the floor. Do not cross your legs. Support your arm on a flat surface, such as a table. Make sure your upper arm is at heart level. Each time you measure, take two or three readings one minute apart and record the results. You may also need to have your blood pressure checked regularly by your health care provider. General information Talk with your health care provider about your diet, exercise habits, and other lifestyle factors that may be contributing to hypertension. Review all the  medicines you take with your health care provider because there may be side effects or interactions. Keep all follow-up visits. Your health care provider can help you create and adjust your plan for managing your high blood pressure. Where to find more information National Heart, Lung, and Blood Institute: popsteam.is American Heart Association: www.heart.org Contact a health care provider if: You think you are having a reaction to medicines you have taken. You have repeated (recurrent) headaches. You feel dizzy. You have swelling in your ankles. You have trouble with your vision. Get help right away if: You develop a severe headache or confusion. You have unusual weakness or numbness, or you feel faint. You have severe pain in your chest or abdomen. You vomit repeatedly. You have trouble breathing. These symptoms may be an emergency. Get help right away. Call 911. Do not wait to see if the symptoms will go away. Do not drive yourself to the hospital. Summary Hypertension is when the force of blood pumping through your arteries is too strong. If this condition is not controlled, it may put you at risk for serious complications. Your personal target blood pressure may vary depending on your medical conditions, your age, and other factors. For most people, a normal blood pressure is less than 120/80. Hypertension is managed by lifestyle changes, medicines, or both. Lifestyle changes to help manage hypertension include losing weight, eating a healthy, low-sodium diet, exercising more, stopping smoking, and limiting alcohol. This information is not intended to replace advice given to you by your health care provider. Make sure you discuss any questions you have with your health care provider. Document Revised: 04/18/2021 Document Reviewed: 04/18/2021 Elsevier Patient Education  2024 [employer].

## 2023-08-19 ENCOUNTER — Telehealth: Payer: Self-pay | Admitting: Registered Nurse

## 2023-08-19 DIAGNOSIS — U071 COVID-19: Secondary | ICD-10-CM

## 2023-08-19 NOTE — Telephone Encounter (Signed)
 Patient stated headache and neck pain almost completely resolved today feeling much better.  Congestion reduced with plan of care.  Feeling well would like to return to work tomorrow.  Discussed mask wear and no eating in employee lunch room x 10 days.  Patient A&Ox3 spoke full sentences without difficulty no audible cough/congestion nasal or throat clearing during 3 minute call.  Discussed if fever/chills/vomiting or diarrhea to stay home.  Come to clinic tomorrow if starts to feel well during work when onsite open 8a-5p.  Patient verbalized understanding information/instructions, agreed with plan of care and had no further questions at this time.  HR and supervisor notified cleared to return onsite 08/20/23 with mask.

## 2023-08-21 NOTE — Telephone Encounter (Signed)
 Patient left message for NP feeling much better denied questions or concerns.  Was able to work without difficulty yesterday.

## 2023-08-25 MED ORDER — COVID-19 ANTIGEN TEST VI KIT: 1.0000 | PACK | Freq: Every day | 1 refills | Status: DC | PRN

## 2023-08-27 NOTE — Telephone Encounter (Signed)
 Patient seen in workcenter wearing mask feeling well.  Asking when he can discontinue mask wear at work.  Does not have any covid home tests at home electronic Rx sent to his pharmacy of choice [facility] RF1 covid home antigen test.  Discussed to test today and if negative send me picture and test again tomorrow and send me picture and then may discontinue mask wear at home and work otherwise 08/28/23 last day for mask wear at work.  Patient A&Ox3 spoke full sentences without difficulty skin warm dry and pink gait sure and steady.  Patient verbalized understanding information/instructions, agreed with plan of care and had no further questions at this time.

## 2023-09-16 NOTE — Telephone Encounter (Signed)
Patient feeling well denied concerns.  Respirations even and unlabored RA skin warm dry and pink A&Ox3 gait sure and steady no nasal congestion/cough/dyspnea observed spoke full sentences without difficulty seen in workcenter.

## 2023-10-22 ENCOUNTER — Encounter: Payer: Self-pay | Admitting: Emergency Medicine

## 2023-10-22 ENCOUNTER — Ambulatory Visit (INDEPENDENT_AMBULATORY_CARE_PROVIDER_SITE_OTHER): Payer: No Typology Code available for payment source | Admitting: Emergency Medicine

## 2023-10-22 VITALS — BP 134/78 | HR 86 | Temp 98.4°F | Ht 66.0 in | Wt 156.0 lb

## 2023-10-22 DIAGNOSIS — Z13228 Encounter for screening for other metabolic disorders: Secondary | ICD-10-CM | POA: Diagnosis not present

## 2023-10-22 DIAGNOSIS — Z1211 Encounter for screening for malignant neoplasm of colon: Secondary | ICD-10-CM

## 2023-10-22 DIAGNOSIS — R7303 Prediabetes: Secondary | ICD-10-CM

## 2023-10-22 DIAGNOSIS — Z0000 Encounter for general adult medical examination without abnormal findings: Secondary | ICD-10-CM

## 2023-10-22 DIAGNOSIS — Z0001 Encounter for general adult medical examination with abnormal findings: Secondary | ICD-10-CM

## 2023-10-22 DIAGNOSIS — I10 Essential (primary) hypertension: Secondary | ICD-10-CM | POA: Diagnosis not present

## 2023-10-22 DIAGNOSIS — Z130 Encounter for screening for diseases of the blood and blood-forming organs and certain disorders involving the immune mechanism: Secondary | ICD-10-CM

## 2023-10-22 DIAGNOSIS — K219 Gastro-esophageal reflux disease without esophagitis: Secondary | ICD-10-CM

## 2023-10-22 DIAGNOSIS — B181 Chronic viral hepatitis B without delta-agent: Secondary | ICD-10-CM | POA: Diagnosis not present

## 2023-10-22 DIAGNOSIS — Z1329 Encounter for screening for other suspected endocrine disorder: Secondary | ICD-10-CM | POA: Diagnosis not present

## 2023-10-22 DIAGNOSIS — Z13220 Encounter for screening for lipoid disorders: Secondary | ICD-10-CM | POA: Diagnosis not present

## 2023-10-22 DIAGNOSIS — Z113 Encounter for screening for infections with a predominantly sexual mode of transmission: Secondary | ICD-10-CM

## 2023-10-22 LAB — LIPID PANEL
Cholesterol: 213 mg/dL — ABNORMAL HIGH (ref 0–200)
HDL: 56.5 mg/dL (ref >39.00)
LDL Cholesterol: 143 mg/dL — ABNORMAL HIGH (ref 0–99)
NonHDL: 156.77
Total CHOL/HDL Ratio: 4
Triglycerides: 67 mg/dL (ref 0.0–149.0)
VLDL: 13.4 mg/dL (ref 0.0–40.0)

## 2023-10-22 LAB — URINALYSIS
Bilirubin Urine: NEGATIVE
Hgb urine dipstick: NEGATIVE
Ketones, ur: NEGATIVE
Leukocytes,Ua: NEGATIVE
Nitrite: NEGATIVE
Specific Gravity, Urine: 1.01 (ref 1.000–1.030)
Total Protein, Urine: NEGATIVE
Urine Glucose: NEGATIVE
Urobilinogen, UA: 0.2 (ref 0.0–1.0)
pH: 8 (ref 5.0–8.0)

## 2023-10-22 LAB — COMPREHENSIVE METABOLIC PANEL
ALT: 24 U/L (ref 0–53)
AST: 30 U/L (ref 0–37)
Albumin: 4.7 g/dL (ref 3.5–5.2)
Alkaline Phosphatase: 46 U/L (ref 39–117)
BUN: 7 mg/dL (ref 6–23)
CO2: 29 meq/L (ref 19–32)
Calcium: 9.6 mg/dL (ref 8.4–10.5)
Chloride: 101 meq/L (ref 96–112)
Creatinine, Ser: 0.97 mg/dL (ref 0.40–1.50)
GFR: 86.39 mL/min (ref >60.00)
Glucose, Bld: 91 mg/dL (ref 70–99)
Potassium: 4.3 meq/L (ref 3.5–5.1)
Sodium: 137 meq/L (ref 135–145)
Total Bilirubin: 0.6 mg/dL (ref 0.2–1.2)
Total Protein: 7.8 g/dL (ref 6.0–8.3)

## 2023-10-22 LAB — CBC WITH DIFFERENTIAL/PLATELET
Basophils Absolute: 0 10*3/uL (ref 0.0–0.1)
Basophils Relative: 0.7 % (ref 0.0–3.0)
Eosinophils Absolute: 0 10*3/uL (ref 0.0–0.7)
Eosinophils Relative: 1.3 % (ref 0.0–5.0)
HCT: 47.1 % (ref 39.0–52.0)
Hemoglobin: 15.2 g/dL (ref 13.0–17.0)
Lymphocytes Relative: 36.1 % (ref 12.0–46.0)
Lymphs Abs: 1.1 10*3/uL (ref 0.7–4.0)
MCHC: 32.1 g/dL (ref 30.0–36.0)
MCV: 97 fl (ref 78.0–100.0)
Monocytes Absolute: 0.4 10*3/uL (ref 0.1–1.0)
Monocytes Relative: 12.9 % — ABNORMAL HIGH (ref 3.0–12.0)
Neutro Abs: 1.6 10*3/uL (ref 1.4–7.7)
Neutrophils Relative %: 49 % (ref 43.0–77.0)
Platelets: 205 10*3/uL (ref 150.0–400.0)
RBC: 4.86 Mil/uL (ref 4.22–5.81)
RDW: 14 % (ref 11.5–15.5)
WBC: 3.2 10*3/uL — ABNORMAL LOW (ref 4.0–10.5)

## 2023-10-22 LAB — HEMOGLOBIN A1C: Hgb A1c MFr Bld: 6 % (ref 4.6–6.5)

## 2023-10-22 LAB — TSH: TSH: 1 u[IU]/mL (ref 0.35–5.50)

## 2023-10-22 LAB — VITAMIN B12: Vitamin B-12: 226 pg/mL (ref 211–911)

## 2023-10-22 LAB — VITAMIN D 25 HYDROXY (VIT D DEFICIENCY, FRACTURES): VITD: 41.22 ng/mL (ref 30.00–100.00)

## 2023-10-22 NOTE — Assessment & Plan Note (Signed)
 Stable chronic condition without complications Continues tenofovir 300 mg daily

## 2023-10-22 NOTE — Patient Instructions (Signed)
 Health Maintenance, Male  Adopting a healthy lifestyle and getting preventive care are important in promoting health and wellness. Ask your health care provider about:  The right schedule for you to have regular tests and exams.  Things you can do on your own to prevent diseases and keep yourself healthy.  What should I know about diet, weight, and exercise?  Eat a healthy diet    Eat a diet that includes plenty of vegetables, fruits, low-fat dairy products, and lean protein.  Do not eat a lot of foods that are high in solid fats, added sugars, or sodium.  Maintain a healthy weight  Body mass index (BMI) is a measurement that can be used to identify possible weight problems. It estimates body fat based on height and weight. Your health care provider can help determine your BMI and help you achieve or maintain a healthy weight.  Get regular exercise  Get regular exercise. This is one of the most important things you can do for your health. Most adults should:  Exercise for at least 150 minutes each week. The exercise should increase your heart rate and make you sweat (moderate-intensity exercise).  Do strengthening exercises at least twice a week. This is in addition to the moderate-intensity exercise.  Spend less time sitting. Even light physical activity can be beneficial.  Watch cholesterol and blood lipids  Have your blood tested for lipids and cholesterol at 58 years of age, then have this test every 5 years.  You may need to have your cholesterol levels checked more often if:  Your lipid or cholesterol levels are high.  You are older than 58 years of age.  You are at high risk for heart disease.  What should I know about cancer screening?  Many types of cancers can be detected early and may often be prevented. Depending on your health history and family history, you may need to have cancer screening at various ages. This may include screening for:  Colorectal cancer.  Prostate cancer.  Skin cancer.  Lung  cancer.  What should I know about heart disease, diabetes, and high blood pressure?  Blood pressure and heart disease  High blood pressure causes heart disease and increases the risk of stroke. This is more likely to develop in people who have high blood pressure readings or are overweight.  Talk with your health care provider about your target blood pressure readings.  Have your blood pressure checked:  Every 3-5 years if you are 9-95 years of age.  Every year if you are 85 years old or older.  If you are between the ages of 29 and 29 and are a current or former smoker, ask your health care provider if you should have a one-time screening for abdominal aortic aneurysm (AAA).  Diabetes  Have regular diabetes screenings. This checks your fasting blood sugar level. Have the screening done:  Once every three years after age 23 if you are at a normal weight and have a low risk for diabetes.  More often and at a younger age if you are overweight or have a high risk for diabetes.  What should I know about preventing infection?  Hepatitis B  If you have a higher risk for hepatitis B, you should be screened for this virus. Talk with your health care provider to find out if you are at risk for hepatitis B infection.  Hepatitis C  Blood testing is recommended for:  Everyone born from [birth year] through 1965.  Anyone  with known risk factors for hepatitis C.  Sexually transmitted infections (STIs)  You should be screened each year for STIs, including gonorrhea and chlamydia, if:  You are sexually active and are younger than 58 years of age.  You are older than 58 years of age and your health care provider tells you that you are at risk for this type of infection.  Your sexual activity has changed since you were last screened, and you are at increased risk for chlamydia or gonorrhea. Ask your health care provider if you are at risk.  Ask your health care provider about whether you are at high risk for HIV. Your health care provider  may recommend a prescription medicine to help prevent HIV infection. If you choose to take medicine to prevent HIV, you should first get tested for HIV. You should then be tested every 3 months for as long as you are taking the medicine.  Follow these instructions at home:  Alcohol use  Do not drink alcohol if your health care provider tells you not to drink.  If you drink alcohol:  Limit how much you have to 0-2 drinks a day.  Know how much alcohol is in your drink. In the [place] one drink equals one 12 oz bottle of beer (355 mL), one 5 oz glass of wine (148 mL), or one 1 oz glass of hard liquor (44 mL).  Lifestyle  Do not use any products that contain nicotine or tobacco. These products include cigarettes, chewing tobacco, and vaping devices, such as e-cigarettes. If you need help quitting, ask your health care provider.  Do not use street drugs.  Do not share needles.  Ask your health care provider for help if you need support or information about quitting drugs.  General instructions  Schedule regular health, dental, and eye exams.  Stay current with your vaccines.  Tell your health care provider if:  You often feel depressed.  You have ever been abused or do not feel safe at home.  Summary  Adopting a healthy lifestyle and getting preventive care are important in promoting health and wellness.  Follow your health care provider's instructions about healthy diet, exercising, and getting tested or screened for diseases.  Follow your health care provider's instructions on monitoring your cholesterol and blood pressure.  This information is not intended to replace advice given to you by your health care provider. Make sure you discuss any questions you have with your health care provider.  Document Revised: 12/24/2020 Document Reviewed: 12/24/2020  Elsevier Patient Education  2024 [employer].

## 2023-10-22 NOTE — Assessment & Plan Note (Signed)
Diet and nutrition discussed.  Advised to decrease amount of daily carbohydrate intake. 

## 2023-10-22 NOTE — Assessment & Plan Note (Signed)
 Stable and well controlled.

## 2023-10-22 NOTE — Assessment & Plan Note (Signed)
 Elevated blood pressure reading in the office today Cardiovascular risks associated with hypertension discussed Recommend to take hydrochlorothiazide 25 mg daily Dietary approaches to stop hypertension discussed Blood work done today

## 2023-10-22 NOTE — Progress Notes (Signed)
 Grant Wilson 58 y.o.   Chief Complaint  Patient presents with   Annual Exam    Patient here for physical. Pt states no other concerns     HISTORY OF PRESENT ILLNESS: This is a 58 y.o. male here for annual exam and follow-up on chronic medical conditions History of hypertension but not taking medication on a daily basis Hepatitis B carrier, on tenofovir 300 mg daily Overall doing well. No complaints or any other medical concerns today.  HPI   Prior to Admission medications   Medication Sig Start Date End Date Taking? Authorizing Provider  hydrochlorothiazide (HYDRODIURIL) 25 MG tablet Take 1 tablet (25 mg total) by mouth daily. 04/07/23  Yes Betancourt, Jarold Song, NP  tenofovir (VIREAD) 300 MG tablet Take 300 mg by mouth daily.   Yes [provider]  COVID-19 Antigen Test KIT Place 1 Device into the nose daily as needed. 08/25/23   Betancourt, Jarold Song, NP    Allergies  Allergen Reactions   Losartan Other (See Comments)    Visual changes blurred moderate resolved after stopping losartan    Patient Active Problem List   Diagnosis Date Noted   Hemorrhoids 01/16/2022   Helicobacter pylori (H. 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HCC) 10/16/2021   History of colonic polyps 10/16/2021   Primary hypertension 10/16/2021   Vitamin D deficiency 08/24/2020   Positive ANA (antinuclear antibody) 08/24/2020   Prediabetes 10/24/2017   Gastro-esophageal reflux disease without esophagitis 11/08/2016   DDD (degenerative disc disease), lumbar 01/11/2016   Chronic low back pain 07/22/2012   High cholesterol 05/12/2012   Seasonal allergies 05/12/2012   Chronic viral hepatitis B without delta agent and without coma (HCC) 09/22/2011    Past Medical History:  Diagnosis Date   Allergy    GERD (gastroesophageal reflux disease)    Hepatitis B    Hyperlipidemia    Prediabetes     Past Surgical History:  Procedure Laterality Date    NO PAST SURGERIES      Social History   Socioeconomic History   Marital status: Single    Spouse name: n/a   Number of children: 0   Years of education: associates   Highest education level: Not on file  Occupational History   Occupation: IT department    Comment: [employer]  Tobacco Use   Smoking status: Never   Smokeless tobacco: Never  Vaping Use   Vaping status: Never Used  Substance and Sexual Activity   Alcohol use: No    Alcohol/week: 0.0 standard drinks of alcohol   Drug use: No   Sexual activity: Yes    Partners: Female    Birth control/protection: Condom  Other Topics Concern   Not on file  Social History Narrative   From [place]. Came to the [place] in 1997.   Lives alone.   No children   Social Drivers of Corporate investment banker Strain: Low Risk  (10/21/2023)   Overall Financial Resource Strain (CARDIA)    Difficulty of Paying Living Expenses: Not hard at all  Food Insecurity: No Food Insecurity (10/21/2023)   Hunger Vital Sign    Worried About Running Out of Food in the Last Year: Never true    Ran Out of Food in the Last Year: Never true  Transportation Needs: Not on file  Physical Activity: Insufficiently Active (10/21/2023)   Exercise Vital Sign    Days of Exercise per Week: 1 day  Minutes of Exercise per Session: 30 min  Stress: No Stress Concern Present (10/21/2023)   [employer] of Occupational Health - Occupational Stress Questionnaire    Feeling of Stress : Only a little  Social Connections: Moderately Isolated (10/21/2023)   Social Connection and Isolation Panel [NHANES]    Frequency of Communication with Friends and Family: More than three times a week    Frequency of Social Gatherings with Friends and Family: Once a week    Attends Religious Services: 1 to 4 times per year    Active Member of [employer] or Organizations: No    Attends Engineer, structural: Not on file    Marital Status: Divorced  Intimate Partner Violence: Not on  file    No family history on file.   Review of Systems  Constitutional: Negative.  Negative for chills and fever.  HENT: Negative.  Negative for congestion and sore throat.   Respiratory: Negative.  Negative for cough and shortness of breath.   Cardiovascular: Negative.  Negative for chest pain and palpitations.  Gastrointestinal:  Negative for abdominal pain, diarrhea, nausea and vomiting.  Genitourinary: Negative.  Negative for dysuria and hematuria.  Skin: Negative.  Negative for rash.  Neurological: Negative.  Negative for dizziness and headaches.  All other systems reviewed and are negative.   Vitals:   10/22/23 0954  BP: 134/78  Pulse: 86  Temp: 98.4 F (36.9 C)  SpO2: 99%    Physical Exam Vitals reviewed.  Constitutional:      Appearance: Normal appearance.  HENT:     Head: Normocephalic.     Right Ear: Tympanic membrane, ear canal and external ear normal.     Left Ear: Tympanic membrane, ear canal and external ear normal.     Mouth/Throat:     Mouth: Mucous membranes are moist.     Pharynx: Oropharynx is clear.  Eyes:     Extraocular Movements: Extraocular movements intact.     Conjunctiva/sclera: Conjunctivae normal.     Pupils: Pupils are equal, round, and reactive to light.  Cardiovascular:     Rate and Rhythm: Normal rate and regular rhythm.     Pulses: Normal pulses.     Heart sounds: Normal heart sounds.  Pulmonary:     Effort: Pulmonary effort is normal.     Breath sounds: Normal breath sounds.  Abdominal:     Palpations: Abdomen is soft.     Tenderness: There is no abdominal tenderness.  Musculoskeletal:        General: Normal range of motion.     Cervical back: No tenderness.  Lymphadenopathy:     Cervical: No cervical adenopathy.  Skin:    General: Skin is warm and dry.     Capillary Refill: Capillary refill takes less than 2 seconds.  Neurological:     General: No focal deficit present.     Mental Status: He is alert and oriented to  person, place, and time.  Psychiatric:        Mood and Affect: Mood normal.        Behavior: Behavior normal.      ASSESSMENT & PLAN: Problem List Items Addressed This Visit       Cardiovascular and Mediastinum   Primary hypertension   Elevated blood pressure reading in the office today Cardiovascular risks associated with hypertension discussed Recommend to take hydrochlorothiazide 25 mg daily Dietary approaches to stop hypertension discussed Blood work done today      Relevant Orders  Comprehensive metabolic panel     Digestive   Gastro-esophageal reflux disease without esophagitis   Stable and well-controlled.        Other   Prediabetes   Diet and nutrition discussed. Advised to decrease amount of daily carbohydrate intake.       Relevant Orders   Hemoglobin A1c   Hepatitis B carrier (HCC)   Stable chronic condition without complications Continues tenofovir 300 mg daily      Other Visit Diagnoses       Encounter for general adult medical examination with abnormal findings    -  Primary   Relevant Orders   Comprehensive metabolic panel   CBC with Differential/Platelet   Hemoglobin A1c   Hepatitis C antibody   Lipid panel   HIV Antibody (routine testing w rflx)   RPR   Vitamin B12   VITAMIN D 25 Hydroxy (Vit-D Deficiency, Fractures)   TSH   C. trachomatis/N. gonorrhoeae RNA   Urinalysis     Colon cancer screening       Relevant Orders   Cologuard     Screening for deficiency anemia       Relevant Orders   CBC with Differential/Platelet     Screening for lipoid disorders       Relevant Orders   Lipid panel     Screening for endocrine, metabolic and immunity disorder       Relevant Orders   Comprehensive metabolic panel   Vitamin B12   VITAMIN D 25 Hydroxy (Vit-D Deficiency, Fractures)   TSH     Screen for STD (sexually transmitted disease)       Relevant Orders   Hepatitis C antibody   HIV Antibody (routine testing w rflx)   RPR   C.  trachomatis/N. gonorrhoeae RNA      Modifiable risk factors discussed with patient. Anticipatory guidance according to age provided. The following topics were also discussed: Social Determinants of Health Smoking.  Non-smoker Diet and nutrition Benefits of exercise Cancer screening and need for colon cancer screening.  Chooses Cologuard Vaccinations review and recommendations Cardiovascular risk assessment and need for blood work The 10-year ASCVD risk score (Arnett DK, et al., 2019) is: 10%   Values used to calculate the score:     Age: 8 years     Sex: Male     Is Non-Hispanic African American: No     Diabetic: No     Tobacco smoker: No     Systolic Blood Pressure: 134 mmHg     Is BP treated: Yes     HDL Cholesterol: 44 mg/dL     Total Cholesterol: 213 mg/dL Review of chronic medical conditions and their management Review of all medications Mental health including depression and anxiety Fall and accident prevention  Patient Instructions  Health Maintenance, Male Adopting a healthy lifestyle and getting preventive care are important in promoting health and wellness. Ask your health care provider about: The right schedule for you to have regular tests and exams. Things you can do on your own to prevent diseases and keep yourself healthy. What should I know about diet, weight, and exercise? Eat a healthy diet  Eat a diet that includes plenty of vegetables, fruits, low-fat dairy products, and lean protein. Do not eat a lot of foods that are high in solid fats, added sugars, or sodium. Maintain a healthy weight Body mass index (BMI) is a measurement that can be used to identify possible weight problems. It estimates body fat based  on height and weight. Your health care provider can help determine your BMI and help you achieve or maintain a healthy weight. Get regular exercise Get regular exercise. This is one of the most important things you can do for your health. Most  adults should: Exercise for at least 150 minutes each week. The exercise should increase your heart rate and make you sweat (moderate-intensity exercise). Do strengthening exercises at least twice a week. This is in addition to the moderate-intensity exercise. Spend less time sitting. Even light physical activity can be beneficial. Watch cholesterol and blood lipids Have your blood tested for lipids and cholesterol at 58 years of age, then have this test every 5 years. You may need to have your cholesterol levels checked more often if: Your lipid or cholesterol levels are high. You are older than 58 years of age. You are at high risk for heart disease. What should I know about cancer screening? Many types of cancers can be detected early and may often be prevented. Depending on your health history and family history, you may need to have cancer screening at various ages. This may include screening for: Colorectal cancer. Prostate cancer. Skin cancer. Lung cancer. What should I know about heart disease, diabetes, and high blood pressure? Blood pressure and heart disease High blood pressure causes heart disease and increases the risk of stroke. This is more likely to develop in people who have high blood pressure readings or are overweight. Talk with your health care provider about your target blood pressure readings. Have your blood pressure checked: Every 3-5 years if you are 29-75 years of age. Every year if you are 27 years old or older. If you are between the ages of 57 and 37 and are a current or former smoker, ask your health care provider if you should have a one-time screening for abdominal aortic aneurysm (AAA). Diabetes Have regular diabetes screenings. This checks your fasting blood sugar level. Have the screening done: Once every three years after age 61 if you are at a normal weight and have a low risk for diabetes. More often and at a younger age if you are overweight or have  a high risk for diabetes. What should I know about preventing infection? Hepatitis B If you have a higher risk for hepatitis B, you should be screened for this virus. Talk with your health care provider to find out if you are at risk for hepatitis B infection. Hepatitis C Blood testing is recommended for: Everyone born from [birth year] through 1965. Anyone with known risk factors for hepatitis C. Sexually transmitted infections (STIs) You should be screened each year for STIs, including gonorrhea and chlamydia, if: You are sexually active and are younger than 58 years of age. You are older than 58 years of age and your health care provider tells you that you are at risk for this type of infection. Your sexual activity has changed since you were last screened, and you are at increased risk for chlamydia or gonorrhea. Ask your health care provider if you are at risk. Ask your health care provider about whether you are at high risk for HIV. Your health care provider may recommend a prescription medicine to help prevent HIV infection. If you choose to take medicine to prevent HIV, you should first get tested for HIV. You should then be tested every 3 months for as long as you are taking the medicine. Follow these instructions at home: Alcohol use Do not drink alcohol if your  health care provider tells you not to drink. If you drink alcohol: Limit how much you have to 0-2 drinks a day. Know how much alcohol is in your drink. In the [place] one drink equals one 12 oz bottle of beer (355 mL), one 5 oz glass of wine (148 mL), or one 1 oz glass of hard liquor (44 mL). Lifestyle Do not use any products that contain nicotine or tobacco. These products include cigarettes, chewing tobacco, and vaping devices, such as e-cigarettes. If you need help quitting, ask your health care provider. Do not use street drugs. Do not share needles. Ask your health care provider for help if you need support or information about  quitting drugs. General instructions Schedule regular health, dental, and eye exams. Stay current with your vaccines. Tell your health care provider if: You often feel depressed. You have ever been abused or do not feel safe at home. Summary Adopting a healthy lifestyle and getting preventive care are important in promoting health and wellness. Follow your health care provider's instructions about healthy diet, exercising, and getting tested or screened for diseases. Follow your health care provider's instructions on monitoring your cholesterol and blood pressure. This information is not intended to replace advice given to you by your health care provider. Make sure you discuss any questions you have with your health care provider. Document Revised: 12/24/2020 Document Reviewed: 12/24/2020 Elsevier Patient Education  2024 Elsevier Inc.      Edwina Barth, MD [facility] at [facility]

## 2023-10-23 LAB — HEPATITIS C ANTIBODY: Hepatitis C Ab: NONREACTIVE

## 2023-10-23 LAB — HIV ANTIBODY (ROUTINE TESTING W REFLEX): HIV 1&2 Ab, 4th Generation: NONREACTIVE

## 2023-10-23 LAB — RPR: RPR Ser Ql: NONREACTIVE

## 2023-10-25 LAB — GC/CHLAMYDIA PROBE AMP
Chlamydia trachomatis, NAA: NEGATIVE
Neisseria Gonorrhoeae by PCR: NEGATIVE

## 2023-11-03 ENCOUNTER — Encounter: Payer: Self-pay | Admitting: Registered Nurse

## 2023-11-03 ENCOUNTER — Telehealth: Payer: Self-pay | Admitting: Registered Nurse

## 2023-11-03 DIAGNOSIS — K648 Other hemorrhoids: Secondary | ICD-10-CM

## 2023-11-03 DIAGNOSIS — K5730 Diverticulosis of large intestine without perforation or abscess without bleeding: Secondary | ICD-10-CM | POA: Insufficient documentation

## 2023-11-03 NOTE — Telephone Encounter (Signed)
 Patient with questions if he needs cologuard test as had colonoscopy [facility] 2024.  Patient brought results to clinic and requested results be shared with [facility] Dr Alvy Bimler.  [place].  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  Discussed with patient cologuard not needed at this time but if changing symptoms abdomen/stools to notify clinic staff/PCM.  Patient completed labs/BP with PCM office for [facility] 2026 BP 134/78 LDL 143 Hgba1c 6.0 weight 161WRU height 65" BMI 25.96  HR notified met requirements for [facility] discount starting 18 May 2024  Patient signed tobacco attestation and be well paperwork and HR notified met discount requirements.

## 2023-11-17 ENCOUNTER — Ambulatory Visit: Admitting: Registered Nurse

## 2023-11-17 ENCOUNTER — Encounter: Payer: Self-pay | Admitting: Registered Nurse

## 2023-11-17 ENCOUNTER — Other Ambulatory Visit: Payer: Self-pay | Admitting: Registered Nurse

## 2023-11-17 VITALS — BP 147/82

## 2023-11-17 DIAGNOSIS — Z0000 Encounter for general adult medical examination without abnormal findings: Secondary | ICD-10-CM

## 2023-11-17 DIAGNOSIS — I10 Essential (primary) hypertension: Secondary | ICD-10-CM

## 2023-11-17 MED ORDER — HYDROCHLOROTHIAZIDE 25 MG PO TABS: 25.0000 mg | ORAL_TABLET | Freq: Every day | ORAL | 3 refills | Status: DC

## 2023-11-17 NOTE — Progress Notes (Signed)
 In to get refill of Rx and BP check.

## 2023-11-17 NOTE — Progress Notes (Addendum)
 Subjective:    Patient ID: Grant Wilson, male    DOB: Jul 17, 1966, 58 y.o.   MRN: [id number]  58y/o established african [place] male here for refill hydrochlorothiazide last filled from [facility] 04/06/2023 per chart review.  Stated not taking every day only when BP elevated on home check.  Stated eating the healthiest he has ever eaten in his life, not overweight and blood pressure still elevated frustrating.  Labs done last month.      Review of Systems  Constitutional:  Negative for chills and fever.  HENT:  Negative for trouble swallowing and voice change.   Eyes:  Negative for photophobia and visual disturbance.  Respiratory:  Negative for cough, shortness of breath, wheezing and stridor.   Gastrointestinal:  Negative for vomiting.  Genitourinary:  Negative for difficulty urinating.  Musculoskeletal:  Negative for gait problem, neck pain and neck stiffness.  Skin:  Negative for rash.  Neurological:  Negative for dizziness, tremors, syncope, speech difficulty, weakness, light-headedness and headaches.  Psychiatric/Behavioral:  Negative for agitation, confusion and sleep disturbance.        Objective:   Physical Exam     Latest Reference Range & Units 10/22/23 10:24  COMPREHENSIVE METABOLIC PANEL WITH GFR  Rpt  Sodium 135 - 145 mEq/L 137  Potassium 3.5 - 5.1 mEq/L 4.3  Chloride 96 - 112 mEq/L 101  CO2 19 - 32 mEq/L 29  Glucose 70 - 99 mg/dL 91  BUN 6 - 23 mg/dL 7  Creatinine 4.09 - 8.11 mg/dL 9.14  Calcium 8.4 - 78.2 mg/dL 9.6  Alkaline Phosphatase 39 - 117 U/L 46  Albumin 3.5 - 5.2 g/dL 4.7  AST 0 - 37 U/L 30  ALT 0 - 53 U/L 24  Total Protein 6.0 - 8.3 g/dL 7.8  Total Bilirubin 0.2 - 1.2 mg/dL 0.6  GFR >95.62 mL/min 86.39  Total CHOL/HDL Ratio  4  Cholesterol 0 - 200 mg/dL 130 (H)  HDL Cholesterol >39.00 mg/dL 86.57  LDL (calc) 0 - 99 mg/dL 846 (H)  NonHDL  962.95  Triglycerides 0.0 - 149.0 mg/dL 28.4  VLDL 0.0 - 13.2 mg/dL 44.0  VITD 10.27 - 253.66 ng/mL 41.22   Vitamin B12 211 - 911 pg/mL 226  WBC 4.0 - 10.5 K/uL 3.2 (L)  RBC 4.22 - 5.81 Mil/uL 4.86  Hemoglobin 13.0 - 17.0 g/dL 44.0  HCT 34.7 - 42.5 % 47.1  MCV 78.0 - 100.0 fl 97.0  MCHC 30.0 - 36.0 g/dL 95.6  RDW 38.7 - 56.4 % 14.0  Platelets 150.0 - 400.0 K/uL 205.0  Neutrophils 43.0 - 77.0 % 49.0  Lymphocytes 12.0 - 46.0 % 36.1  Monocytes Relative 3.0 - 12.0 % 12.9 (H)  Eosinophil 0.0 - 5.0 % 1.3  Basophil 0.0 - 3.0 % 0.7  NEUT# 1.4 - 7.7 K/uL 1.6  Lymphs Abs 0.7 - 4.0 K/uL 1.1  Monocyte # 0.1 - 1.0 K/uL 0.4  Eosinophils Absolute 0.0 - 0.7 K/uL 0.0  Basophils Absolute 0.0 - 0.1 K/uL 0.0  Hemoglobin A1C 4.6 - 6.5 % 6.0  TSH 0.35 - 5.50 uIU/mL 1.00  (H): Data is abnormally high (L): Data is abnormally low Rpt: View report in Results Review for more information  BP 173/68 09/11/23 [place] office visit    Assessment & Plan:   A-essential hypertension, preventative health care  P-refilled hydrochlorothiazide 25mg  po daily #90 RF3 from [facility] to patient today.  Labs current renal function/electrolytes stable.  Discussed importance of taking medication daily.  Consider dash diet  exitcare handouts on dash diet and managing hypertension.  Follow up 1 month repeat BP with RN Olegario Messier.  Patient verbalized understanding information/instructions, agreed with plan of care and had no further questions at this time.  Patient to complete Be Well paperwork including alternatives with RN Olegario Messier.  Hgba1c 6.0 LDL 143 BP 147/82 today LDL not less than 130, BP not less than 135/85 and A1c less than 7 Did not meet requirements for 2026 Be Well.Be well insurance premium discount evaluation:   Patient may start alternative plan with RN Olegario Messier take hydrochlorothiazide daily weekly BP check with RN Olegario Messier or bring home log to clinic for review.  Patient verbalized understanding information/instructions, agreed with plan of care and had no further questions at this time.

## 2023-11-17 NOTE — Telephone Encounter (Signed)
 Patient fills medication at his worksite [facility] formulary dispensed 90 tabs to patient today and instructed to follow up with RN Grant Wilson for repeat BP check.  Last filled Aug 2024 not taking every day and BP not at goal.  Discussed taking his hydrochlorothiazide daily 25mg  not skipping days/doses.  Patient stated he understanding blood pressure elevated and takes medication on days it is elevated/does home BP check.

## 2023-11-17 NOTE — Addendum Note (Signed)
 Addended by: Albina Billet A on: 11/17/2023 10:33 PM   Modules accepted: Orders

## 2023-11-17 NOTE — Patient Instructions (Signed)
 DASH Eating Plan DASH stands for Dietary Approaches to Stop Hypertension. The DASH eating plan is a healthy eating plan that has been shown to: Lower high blood pressure (hypertension). Reduce your risk for type 2 diabetes, heart disease, and stroke. Help with weight loss. What are tips for following this plan? Reading food labels Check food labels for the amount of salt (sodium) per serving. Choose foods with less than 5 percent of the Daily Value (DV) of sodium. In general, foods with less than 300 milligrams (mg) of sodium per serving fit into this eating plan. To find whole grains, look for the word "whole" as the first word in the ingredient list. Shopping Buy products labeled as "low-sodium" or "no salt added." Buy fresh foods. Avoid canned foods and pre-made or frozen meals. Cooking Try not to add salt when you cook. Use salt-free seasonings or herbs instead of table salt or sea salt. Check with your health care provider or pharmacist before using salt substitutes. Do not fry foods. Cook foods in healthy ways, such as baking, boiling, grilling, roasting, or broiling. Cook using oils that are good for your heart. These include olive, canola, avocado, soybean, and sunflower oil. Meal planning  Eat a balanced diet. This should include: 4 or more servings of fruits and 4 or more servings of vegetables each day. Try to fill half of your plate with fruits and vegetables. 6-8 servings of whole grains each day. 6 or less servings of lean meat, poultry, or fish each day. 1 oz is 1 serving. A 3 oz (85 g) serving of meat is about the same size as the palm of your hand. One egg is 1 oz (28 g). 2-3 servings of low-fat dairy each day. One serving is 1 cup (237 mL). 1 serving of nuts, seeds, or beans 5 times each week. 2-3 servings of heart-healthy fats. Healthy fats called omega-3 fatty acids are found in foods such as walnuts, flaxseeds, fortified milks, and eggs. These fats are also found in  cold-water fish, such as sardines, salmon, and mackerel. Limit how much you eat of: Canned or prepackaged foods. Food that is high in trans fat, such as fried foods. Food that is high in saturated fat, such as fatty meat. Desserts and other sweets, sugary drinks, and other foods with added sugar. Full-fat dairy products. Do not salt foods before eating. Do not eat more than 4 egg yolks a week. Try to eat at least 2 vegetarian meals a week. Eat more home-cooked food and less restaurant, buffet, and fast food. Lifestyle When eating at a restaurant, ask if your food can be made with less salt or no salt. If you drink alcohol: Limit how much you have to: 0-1 drink a day if you are male. 0-2 drinks a day if you are male. Know how much alcohol is in your drink. In the [place] one drink is one 12 oz bottle of beer (355 mL), one 5 oz glass of wine (148 mL), or one 1 oz glass of hard liquor (44 mL). General information Avoid eating more than 2,300 mg of salt a day. If you have hypertension, you may need to reduce your sodium intake to 1,500 mg a day. Work with your provider to stay at a healthy body weight or lose weight. Ask what the best weight range is for you. On most days of the week, get at least 30 minutes of exercise that causes your heart to beat faster. This may include walking, swimming, or  biking. Work with your provider or dietitian to adjust your eating plan to meet your specific calorie needs. What foods should I eat? Fruits All fresh, dried, or frozen fruit. Canned fruits that are in their natural juice and do not have sugar added to them. Vegetables Fresh or frozen vegetables that are raw, steamed, roasted, or grilled. Low-sodium or reduced-sodium tomato and vegetable juice. Low-sodium or reduced-sodium tomato sauce and tomato paste. Low-sodium or reduced-sodium canned vegetables. Grains Whole-grain or whole-wheat bread. Whole-grain or whole-wheat pasta. Brown rice. Orpah Cobb. Bulgur. Whole-grain and low-sodium cereals. Pita bread. Low-fat, low-sodium crackers. Whole-wheat flour tortillas. Meats and other proteins Skinless chicken or [place]. Ground chicken or [place]. Pork with fat trimmed off. Fish and seafood. Egg whites. Dried beans, peas, or lentils. Unsalted nuts, nut butters, and seeds. Unsalted canned beans. Lean cuts of beef with fat trimmed off. Low-sodium, lean precooked or cured meat, such as sausages or meat loaves. Dairy Low-fat (1%) or fat-free (skim) milk. Reduced-fat, low-fat, or fat-free cheeses. Nonfat, low-sodium ricotta or cottage cheese. Low-fat or nonfat yogurt. Low-fat, low-sodium cheese. Fats and oils Soft margarine without trans fats. Vegetable oil. Reduced-fat, low-fat, or light mayonnaise and salad dressings (reduced-sodium). Canola, safflower, olive, avocado, soybean, and sunflower oils. Avocado. Seasonings and condiments Herbs. Spices. Seasoning mixes without salt. Other foods Unsalted popcorn and pretzels. Fat-free sweets. The items listed above may not be all the foods and drinks you can have. Talk to a dietitian to learn more. What foods should I avoid? Fruits Canned fruit in a light or heavy syrup. Fried fruit. Fruit in cream or butter sauce. Vegetables Creamed or fried vegetables. Vegetables in a cheese sauce. Regular canned vegetables that are not marked as low-sodium or reduced-sodium. Regular canned tomato sauce and paste that are not marked as low-sodium or reduced-sodium. Regular tomato and vegetable juices that are not marked as low-sodium or reduced-sodium. Rosita Fire. Olives. Grains Baked goods made with fat, such as croissants, muffins, or some breads. Dry pasta or rice meal packs. Meats and other proteins Fatty cuts of meat. Ribs. Fried meat. Tomasa Blase. Bologna, salami, and other precooked or cured meats, such as sausages or meat loaves, that are not lean and low in sodium. Fat from the back of a pig (fatback). Bratwurst.  Salted nuts and seeds. Canned beans with added salt. Canned or smoked fish. Whole eggs or egg yolks. Chicken or [place] with skin. Dairy Whole or 2% milk, cream, and half-and-half. Whole or full-fat cream cheese. Whole-fat or sweetened yogurt. Full-fat cheese. Nondairy creamers. Whipped toppings. Processed cheese and cheese spreads. Fats and oils Butter. Stick margarine. Lard. Shortening. Ghee. Bacon fat. Tropical oils, such as coconut, palm kernel, or palm oil. Seasonings and condiments Onion salt, garlic salt, seasoned salt, table salt, and sea salt. Worcestershire sauce. Tartar sauce. Barbecue sauce. Teriyaki sauce. Soy sauce, including reduced-sodium soy sauce. Steak sauce. Canned and packaged gravies. Fish sauce. Oyster sauce. Cocktail sauce. Store-bought horseradish. Ketchup. Mustard. Meat flavorings and tenderizers. Bouillon cubes. Hot sauces. Pre-made or packaged marinades. Pre-made or packaged taco seasonings. Relishes. Regular salad dressings. Other foods Salted popcorn and pretzels. The items listed above may not be all the foods and drinks you should avoid. Talk to a dietitian to learn more. Where to find more information National Heart, Lung, and Blood Institute (NHLBI): BuffaloDryCleaner.gl American Heart Association (AHA): heart.org Academy of Nutrition and Dietetics: eatright.org National Kidney Foundation (NKF): kidney.org This information is not intended to replace advice given to you by your health care provider. Make sure  you discuss any questions you have with your health care provider. Document Revised: 08/21/2022 Document Reviewed: 08/21/2022 Elsevier Patient Education  2024 Elsevier Inc. Managing Your Hypertension Hypertension, also called high blood pressure, is when the force of the blood pressing against the walls of the arteries is too strong. Arteries are blood vessels that carry blood from your heart throughout your body. Hypertension forces the heart to work harder to pump  blood and may cause the arteries to become narrow or stiff. Understanding blood pressure readings A blood pressure reading includes a higher number over a lower number: The first, or top, number is called the systolic pressure. It is a measure of the pressure in your arteries as your heart beats. The second, or bottom number, is called the diastolic pressure. It is a measure of the pressure in your arteries as the heart relaxes. For most people, a normal blood pressure is below 120/80. Your personal target blood pressure may vary depending on your medical conditions, your age, and other factors. Blood pressure is classified into four stages. Based on your blood pressure reading, your health care provider may use the following stages to determine what type of treatment you need, if any. Systolic pressure and diastolic pressure are measured in a unit called millimeters of mercury (mmHg). Normal Systolic pressure: below 120. Diastolic pressure: below 80. Elevated Systolic pressure: 120-129. Diastolic pressure: below 80. Hypertension stage 1 Systolic pressure: 130-139. Diastolic pressure: 80-89. Hypertension stage 2 Systolic pressure: 140 or above. Diastolic pressure: 90 or above. How can this condition affect me? Managing your hypertension is very important. Over time, hypertension can damage the arteries and decrease blood flow to parts of the body, including the brain, heart, and kidneys. Having untreated or uncontrolled hypertension can lead to: A heart attack. A stroke. A weakened blood vessel (aneurysm). Heart failure. Kidney damage. Eye damage. Memory and concentration problems. Vascular dementia. What actions can I take to manage this condition? Hypertension can be managed by making lifestyle changes and possibly by taking medicines. Your health care provider will help you make a plan to bring your blood pressure within a normal range. You may be referred for counseling on a healthy  diet and physical activity. Nutrition  Eat a diet that is high in fiber and potassium, and low in salt (sodium), added sugar, and fat. An example eating plan is called the DASH diet. DASH stands for Dietary Approaches to Stop Hypertension. To eat this way: Eat plenty of fresh fruits and vegetables. Try to fill one-half of your plate at each meal with fruits and vegetables. Eat whole grains, such as whole-wheat pasta, brown rice, or whole-grain bread. Fill about one-fourth of your plate with whole grains. Eat low-fat dairy products. Avoid fatty cuts of meat, processed or cured meats, and poultry with skin. Fill about one-fourth of your plate with lean proteins such as fish, chicken without skin, beans, eggs, and tofu. Avoid pre-made and processed foods. These tend to be higher in sodium, added sugar, and fat. Reduce your daily sodium intake. Many people with hypertension should eat less than 1,500 mg of sodium a day. Lifestyle  Work with your health care provider to maintain a healthy body weight or to lose weight. Ask what an ideal weight is for you. Get at least 30 minutes of exercise that causes your heart to beat faster (aerobic exercise) most days of the week. Activities may include walking, swimming, or biking. Include exercise to strengthen your muscles (resistance exercise), such as weight  lifting, as part of your weekly exercise routine. Try to do these types of exercises for 30 minutes at least 3 days a week. Do not use any products that contain nicotine or tobacco. These products include cigarettes, chewing tobacco, and vaping devices, such as e-cigarettes. If you need help quitting, ask your health care provider. Control any long-term (chronic) conditions you have, such as high cholesterol or diabetes. Identify your sources of stress and find ways to manage stress. This may include meditation, deep breathing, or making time for fun activities. Alcohol use Do not drink alcohol if: Your  health care provider tells you not to drink. You are pregnant, may be pregnant, or are planning to become pregnant. If you drink alcohol: Limit how much you have to: 0-1 drink a day for women. 0-2 drinks a day for men. Know how much alcohol is in your drink. In the [place] one drink equals one 12 oz bottle of beer (355 mL), one 5 oz glass of wine (148 mL), or one 1 oz glass of hard liquor (44 mL). Medicines Your health care provider may prescribe medicine if lifestyle changes are not enough to get your blood pressure under control and if: Your systolic blood pressure is 130 or higher. Your diastolic blood pressure is 80 or higher. Take medicines only as told by your health care provider. Follow the directions carefully. Blood pressure medicines must be taken as told by your health care provider. The medicine does not work as well when you skip doses. Skipping doses also puts you at risk for problems. Monitoring Before you monitor your blood pressure: Do not smoke, drink caffeinated beverages, or exercise within 30 minutes before taking a measurement. Use the bathroom and empty your bladder (urinate). Sit quietly for at least 5 minutes before taking measurements. Monitor your blood pressure at home as told by your health care provider. To do this: Sit with your back straight and supported. Place your feet flat on the floor. Do not cross your legs. Support your arm on a flat surface, such as a table. Make sure your upper arm is at heart level. Each time you measure, take two or three readings one minute apart and record the results. You may also need to have your blood pressure checked regularly by your health care provider. General information Talk with your health care provider about your diet, exercise habits, and other lifestyle factors that may be contributing to hypertension. Review all the medicines you take with your health care provider because there may be side effects or  interactions. Keep all follow-up visits. Your health care provider can help you create and adjust your plan for managing your high blood pressure. Where to find more information National Heart, Lung, and Blood Institute: PopSteam.is American Heart Association: www.heart.org Contact a health care provider if: You think you are having a reaction to medicines you have taken. You have repeated (recurrent) headaches. You feel dizzy. You have swelling in your ankles. You have trouble with your vision. Get help right away if: You develop a severe headache or confusion. You have unusual weakness or numbness, or you feel faint. You have severe pain in your chest or abdomen. You vomit repeatedly. You have trouble breathing. These symptoms may be an emergency. Get help right away. Call 911. Do not wait to see if the symptoms will go away. Do not drive yourself to the hospital. Summary Hypertension is when the force of blood pumping through your arteries is too strong. If this  condition is not controlled, it may put you at risk for serious complications. Your personal target blood pressure may vary depending on your medical conditions, your age, and other factors. For most people, a normal blood pressure is less than 120/80. Hypertension is managed by lifestyle changes, medicines, or both. Lifestyle changes to help manage hypertension include losing weight, eating a healthy, low-sodium diet, exercising more, stopping smoking, and limiting alcohol. This information is not intended to replace advice given to you by your health care provider. Make sure you discuss any questions you have with your health care provider. Document Revised: 04/18/2021 Document Reviewed: 04/18/2021 Elsevier Patient Education  2024 [employer].

## 2023-12-03 ENCOUNTER — Encounter: Payer: Self-pay | Admitting: Registered Nurse

## 2023-12-03 ENCOUNTER — Ambulatory Visit: Payer: Self-pay | Admitting: Registered Nurse

## 2023-12-03 VITALS — BP 130/77 | HR 78 | Resp 16

## 2023-12-03 DIAGNOSIS — M79672 Pain in left foot: Secondary | ICD-10-CM

## 2023-12-03 MED ORDER — ACETAMINOPHEN 500 MG PO TABS: 1000.0000 mg | ORAL_TABLET | Freq: Four times a day (QID) | ORAL | Status: AC | PRN

## 2023-12-03 NOTE — Patient Instructions (Addendum)
 Foot Pain Many things can cause foot pain. Common causes include injuries to the foot. The injuries include sprains or broken bones, or injuries that affect the nerves in the feet. Other causes of foot pain include arthritis, blisters, and bunions. To know what causes your foot pain, your health care provider will take a detailed history of your symptoms. They will also do a physical exam as well as imaging tests, such as X-ray or MRI. Follow these instructions at home: Managing pain, stiffness, and swelling  If told, put ice on the painful area. Put ice in a plastic bag. Place a towel between your skin and the bag. Leave the ice on for 20 minutes, 2-3 times a day. If your skin turns bright red, remove the ice right away to prevent skin damage. The risk of damage is higher if you cannot feel pain, heat, or cold. Activity Do not stand or walk for long periods. Do stretches to relieve foot pain and stiffness as told by your provider. Do not lift anything that is heavier than 10 lb (4.5 kg), or the limit that you are told, until your provider says that it is safe. Lifting a lot of weight can put added pressure on your feet. Return to your normal activities as told by your provider. Ask your provider what activities are safe for you. Lifestyle Wear comfortable, supportive shoes that fit you well. Do not wear high heels. Keep your feet clean and dry. General instructions Take over-the-counter and prescription medicines only as told by your provider. Rub your foot gently. Pay attention to any changes in your symptoms. Let your provider know if symptoms become worse. Keep all follow-up visits. Your provider will want to monitor your progress. Contact a health care provider if: Your pain does not get better after a few days of treatment at home. Your pain gets worse. You cannot stand on your foot. Your foot or toes are swollen. Your foot is numb or tingling. Get help right away if: Your foot  or toes turn white or blue. You have warmth and redness along your foot. This information is not intended to replace advice given to you by your health care provider. Make sure you discuss any questions you have with your health care provider. Document Revised: 08/28/2022 Document Reviewed: 05/06/2022 Elsevier Patient Education  2024 [employer]Ankle Sprain, Phase I Rehab An ankle sprain is an injury to the tissues that connect bone to bone (ligaments) in your ankle. Ankle sprains can cause stiffness, loss of motion, and loss of strength. Ask your health care provider which exercises are safe for you. Do exercises exactly as told by your provider and adjust them as directed. It is normal to feel mild stretching, pulling, tightness, or discomfort as you do these exercises. Stop right away if you feel sudden pain or your pain gets worse. Do not begin these exercises until told by your provider. Stretching and range-of-motion exercises These exercises warm up your muscles and joints. They can improve the movement and flexibility of your lower leg and ankle. They also help to relieve pain and stiffness. Gastroc and soleus stretch This exercise is also called a calf stretch. It stretches the muscles in the back of the lower leg. These muscles are the gastrocnemius, or gastroc, and the soleus. Sit on the floor with your left / right leg extended. Loop a belt or towel around the ball of your left / right foot. The ball of your foot is on the walking surface,  right under your toes. Keep your left / right ankle and foot relaxed and keep your knee straight. Use the belt or towel to pull your foot toward you. You should feel a gentle stretch behind your calf or knee in your gastroc muscle. Hold this position for ____30______ seconds, then release to the starting position. Repeat the exercise with your knee bent. You can put a pillow or a rolled bath towel under your knee to support it. You should feel a  stretch deep in your calf in the soleus muscle or at your Achilles tendon. Repeat _____3_____ times. Complete this exercise ___2_______ times a day. Ankle alphabet  Sit with your left / right leg supported at the lower leg. Do not rest your foot on anything. Make sure your foot has room to move freely. Think of your left / right foot as a paintbrush. Move your foot to trace each letter of the alphabet in the air. Keep your hip and knee still while you trace. Make the letters as large as you can without feeling discomfort. Trace every letter from A to Z. Repeat ___3_______ times. Complete this exercise __2________ times a day. Strengthening exercises These exercises build strength and endurance in your ankle and lower leg. Endurance is the ability to use your muscles for a long time, even after they get tired. Ankle dorsiflexion  Secure a rubber exercise band or tube to an object, such as a table leg, that will stay still when the band is pulled. Secure the other end around your left / right foot. Sit on the floor facing the object, with your left / right leg extended. The band or tube should be slightly tense when your foot is relaxed. Slowly bring your foot toward you, bringing the top of your foot toward your shin (dorsiflexion), and pulling the band tighter. Hold this position for ____30______ seconds. Slowly return your foot to the starting position. Repeat ____3______ times. Complete this exercise _____2_____ times a day. Ankle plantar flexion  Sit on the floor with your left / right leg extended. Loop a rubber exercise tube or band around the ball of your left / right foot. The ball of your foot is on the walking surface, right under your toes. Hold the ends of the band or tube in your hands. The band or tube should be slightly tense when your foot is relaxed. Slowly point your foot and toes downward to tilt the top of your foot away from your shin (plantar flexion). Hold this  position for ______30____ seconds. Slowly return your foot to the starting position. Repeat _____3_____ times. Complete this exercise _____2_____ times a day. Ankle eversion  Sit on the floor with your legs straight out in front of you. Loop a rubber exercise band or tube around the ball of your left / right foot. The ball of your foot is on the walking surface, right under your toes. Hold the ends of the band in your hands or secure the band to a stable object. The band or tube should be slightly tense when your foot is relaxed. Slowly push your foot outward, away from your other leg (eversion). Hold this position for _____30_____ seconds. Slowly return your foot to the starting position. Repeat _____3_____ times. Complete this exercise ___2_______ times a day. This information is not intended to replace advice given to you by your health care provider. Make sure you discuss any questions you have with your health care provider. Document Revised: 05/28/2022 Document Reviewed: 05/28/2022 Elsevier  Patient Education  2024 [employer]Foot Pain Many things can cause foot pain. Common causes include injuries to the foot. The injuries include sprains or broken bones, or injuries that affect the nerves in the feet. Other causes of foot pain include arthritis, blisters, and bunions. To know what causes your foot pain, your health care provider will take a detailed history of your symptoms. They will also do a physical exam as well as imaging tests, such as X-ray or MRI. Follow these instructions at home: Managing pain, stiffness, and swelling  If told, put ice on the painful area. Put ice in a plastic bag. Place a towel between your skin and the bag. Leave the ice on for 20 minutes, 2-3 times a day. If your skin turns bright red, remove the ice right away to prevent skin damage. The risk of damage is higher if you cannot feel pain, heat, or cold. Activity Do not stand or walk for long  periods. Do stretches to relieve foot pain and stiffness as told by your provider. Do not lift anything that is heavier than 10 lb (4.5 kg), or the limit that you are told, until your provider says that it is safe. Lifting a lot of weight can put added pressure on your feet. Return to your normal activities as told by your provider. Ask your provider what activities are safe for you. Lifestyle Wear comfortable, supportive shoes that fit you well. Do not wear high heels. Keep your feet clean and dry. General instructions Take over-the-counter and prescription medicines only as told by your provider. Rub your foot gently. Pay attention to any changes in your symptoms. Let your provider know if symptoms become worse. Keep all follow-up visits. Your provider will want to monitor your progress. Contact a health care provider if: Your pain does not get better after a few days of treatment at home. Your pain gets worse. You cannot stand on your foot. Your foot or toes are swollen. Your foot is numb or tingling. Get help right away if: Your foot or toes turn white or blue. You have warmth and redness along your foot. This information is not intended to replace advice given to you by your health care provider. Make sure you discuss any questions you have with your health care provider. Document Revised: 08/28/2022 Document Reviewed: 05/06/2022 Elsevier Patient Education  2024 [employer]Plantar Fasciitis: What to Know  Your plantar fascia is a band of thick tissue on the bottom of your foot. It connects your heel bone to the base of your toes. If the fascia gets irritated, it can cause pain in your heel or foot. This is called plantar fasciitis. In some cases, plantar fasciitis can make it hard for you to walk or move. The pain is often worse in the morning after sleeping, or after sitting or lying down for a long time. Pain may also be worse after walking or standing for a long time. What are  the causes? Plantar fasciitis may be caused by: Standing for a long time. Wearing shoes that don't have good arch support. Doing high-impact activities. These are things that put stress on your joints. They include: Ballet. Aerobic exercises. These are exercises that make your heart beat faster. Being overweight. Having a way of walking, or gait, that isn't normal. Tight muscles in your calf, which is in the back of your lower leg. High arches in your feet, or flat feet. Starting a new sport or activity. What are the signs or  symptoms? The main symptom of plantar fasciitis is heel pain. Your pain may get worse after: You take your first steps after a time of rest. This includes in the morning after you wake up, or after you've been sitting or lying down for a while. Standing still for a long time. Pain may lessen after 30-45 minutes of activity, such as gentle walking. How is this diagnosed? Plantar fasciitis may be diagnosed based on your medical history, your symptoms, and an exam. Your health care provider will check for: A tender spot on the bottom of your foot. A high arch in your foot, or flat feet. Pain when you move your foot. Trouble moving your foot. You may also have tests. These may include: X-rays. Ultrasound. MRI. How is this treated? Treatment depends on how bad your plantar fasciitis is. It may include: RICE therapy. This stands for rest, ice, pressure (compression), and raising (elevating) the foot. Exercises to stretch your calves and plantar fascia. A night splint. This holds your foot in a stretched, upward position while you sleep. Physical therapy. This can help with symptoms. It can also prevent problems in the future. Shots of a steroid medicine called cortisone. This can help with pain and irritation. Extracorporeal shock wave therapy. This uses electric shocks to stimulate your plantar fascia. If other treatments don't help, you may need to have  surgery. Follow these instructions at home: Managing pain, stiffness, and swelling  Use ice, an ice pack, or a frozen bottle of water as told. Place a towel between your skin and the ice. Roll the bottom of your foot over the ice or frozen bottle. Do this for 20 minutes, 2-3 times a day. If your skin turns red, take off the ice right away to prevent skin damage. The risk of damage is higher if you can't feel pain, heat, or cold. Wear shoes that have air-sole or gel-sole cushions. You could also try soft shoe inserts made for plantar fasciitis. Activity Try not to do things that cause pain. Ask what things are safe for you to do. Exercise as told. Try activities that are low impact. This means that they're easier on your joints. They include: Swimming. Water aerobics. Biking. General instructions Take your medicines only as told. Wear a night splint as told. Loosen the splint if your toes tingle, are numb, or turn cold and blue. Stay at a healthy weight. Work with your provider to lose weight as needed. Contact a health care provider if: Your symptoms don't go away with treatment. You have pain that gets worse. Your pain makes it hard to move or do everyday things. This information is not intended to replace advice given to you by your health care provider. Make sure you discuss any questions you have with your health care provider. Document Revised: 01/05/2023 Document Reviewed: 01/05/2023 Elsevier Patient Education  2024 [employer].

## 2023-12-03 NOTE — Progress Notes (Signed)
 Subjective:    Patient ID: Grant Wilson, male    DOB: April 03, 1966, 58 y.o.   MRN: [id number]  58y/o african [place] established male here for left foot/ankle pain on/off over the last week.  Certain shoe wear exacerbates foot/ankle pain so not wearing those shoes.  Pain intermitent denied trauma/bruising/rash/swelling.  Sometimes sharp/stabbing and other times resolves completely.  Usually worse with ambulation but sometimes sitting to standing incites also.  Typically midfoot left but sometimes anterior ankle/shin      Review of Systems  Constitutional:  Negative for chills, diaphoresis and fever.  HENT:  Negative for trouble swallowing and voice change.   Respiratory:  Negative for cough.   Cardiovascular:  Negative for leg swelling.  Gastrointestinal:  Negative for diarrhea, nausea and vomiting.  Genitourinary:  Negative for difficulty urinating.  Musculoskeletal:  Positive for arthralgias, gait problem and myalgias. Negative for joint swelling, neck pain and neck stiffness.  Skin:  Negative for color change, pallor, rash and wound.  Neurological:  Negative for tremors, syncope, weakness and numbness.  Hematological:  Negative for adenopathy. Does not bruise/bleed easily.  Psychiatric/Behavioral:  Negative for agitation, confusion and sleep disturbance.        Objective:   Physical Exam Vitals reviewed.  Constitutional:      General: He is awake. He is not in acute distress.    Appearance: Normal appearance. He is well-developed, well-groomed and normal weight. He is not ill-appearing, toxic-appearing or diaphoretic.  HENT:     Head: Normocephalic and atraumatic.     Jaw: There is normal jaw occlusion.     Salivary Glands: Right salivary gland is not diffusely enlarged. Left salivary gland is not diffusely enlarged.     Right Ear: Hearing and external ear normal.     Left Ear: Hearing and external ear normal.     Nose: Nose normal. No congestion or rhinorrhea.      Mouth/Throat:     Lips: Pink. No lesions.     Mouth: Mucous membranes are moist.     Pharynx: Oropharynx is clear.  Eyes:     General: Lids are normal. Vision grossly intact. Gaze aligned appropriately. Allergic shiner present. No scleral icterus.       Right eye: No discharge.        Left eye: No discharge.     Extraocular Movements: Extraocular movements intact.     Conjunctiva/sclera: Conjunctivae normal.     Pupils: Pupils are equal, round, and reactive to light.  Neck:     Trachea: Trachea normal.  Cardiovascular:     Rate and Rhythm: Normal rate and regular rhythm.     Pulses: Normal pulses.          Radial pulses are 2+ on the right side and 2+ on the left side.  Pulmonary:     Effort: Pulmonary effort is normal.     Breath sounds: Normal breath sounds and air entry. No stridor or transmitted upper airway sounds. No wheezing.     Comments: Spoke full sentences without difficulty; no cough observed in exam room Abdominal:     General: Abdomen is flat.  Musculoskeletal:        General: Tenderness present. No swelling or deformity. Normal range of motion.     Right hand: Normal strength. Normal capillary refill.     Left hand: Normal strength. Normal capillary refill.     Cervical back: Normal range of motion and neck supple. No rigidity.     Right  lower leg: No edema.     Left lower leg: No edema.     Left foot: Normal range of motion and normal capillary refill. Tenderness present. No swelling, deformity, bunion, Charcot foot, foot drop, laceration, bony tenderness or crepitus.  Feet:     Left foot:     Skin integrity: Callus and dry skin present. No ulcer, blister, skin breakdown, erythema or fissure.     Toenail Condition: Left toenails are normal.  Lymphadenopathy:     Head:     Right side of head: No submandibular or preauricular adenopathy.     Left side of head: No submandibular or preauricular adenopathy.     Cervical: No cervical adenopathy.     Right cervical:  No superficial cervical adenopathy.    Left cervical: No superficial cervical adenopathy.  Skin:    General: Skin is warm and dry.     Capillary Refill: Capillary refill takes less than 2 seconds.     Coloration: Skin is not ashen, cyanotic, jaundiced, mottled, pale or sallow.     Findings: Rash present. No abrasion, abscess, acne, bruising, burn, ecchymosis, erythema, signs of injury, laceration, lesion, petechiae or wound. Rash is scaling. Rash is not crusting, macular, nodular, papular, purpuric, pustular, urticarial or vesicular.     Nails: There is no clubbing.     Comments: Left foot and lower leg dry skin flaking; callous moccasin pattern left foot  Neurological:     General: No focal deficit present.     Mental Status: He is alert and oriented to person, place, and time. Mental status is at baseline.     GCS: GCS eye subscore is 4. GCS verbal subscore is 5. GCS motor subscore is 6.     Cranial Nerves: Cranial nerves 2-12 are intact. No cranial nerve deficit, dysarthria or facial asymmetry.     Motor: Motor function is intact. No weakness, tremor, atrophy, abnormal muscle tone or seizure activity.     Coordination: Coordination is intact. Coordination normal.     Gait: Gait is intact. Gait normal.     Comments: In/out of chair without difficulty; gait sure and steady in clinic; bilateral hand grasp equal 5/5  Psychiatric:        Attention and Perception: Attention and perception normal.        Mood and Affect: Mood and affect normal.        Speech: Speech normal.        Behavior: Behavior normal. Behavior is cooperative.        Thought Content: Thought content normal.        Cognition and Memory: Cognition and memory normal.        Judgment: Judgment normal.      Mildly TTP over 3rd and 4th MTP no palpable defect full arom and strength 5/5 bilaterally feet/lower extremities     Assessment & Plan:   A-left foot/ankle pain acute initial encounter  P-discussed avoid shoes  that worsen pain.  Rotate shoes do not wear same pair every day.  Ankle alphabet daily and picking up handcloth/washcloth with toes after work to stretch foot muscles.  Tylenol  1000mg  po q6h prn pain [facility] handout on plantar fasciitis and  ankle sprain rehab exercises printed and given to patient.  Noted error was to be foot pain and ankle sprain rehab handouts emailed to patient foot pain [facility] handout.   Discussed achilles/gastrocnemius/foot/plantar stretches and icing 15 minutes at least nightly with frozen water bottle rolling foot over.  Can  alternate with heat.   Follow up with PCM if no improvement with discussed care for podiatry referral.  Do not walk barefoot at home or thin leather no support sandals/flip flops/shoes as this can worsen condition/pain.  Consider compression socks/plantar fasciitis socks.  Imaging if no improvement with plan of care e.g. foot film outpatient center. Patient verbalized understanding of instructions, agreed with plan of care and had no further questions at this time.

## 2024-02-02 ENCOUNTER — Ambulatory Visit: Payer: Self-pay | Admitting: Registered Nurse

## 2024-02-02 ENCOUNTER — Encounter: Payer: Self-pay | Admitting: Registered Nurse

## 2024-02-02 VITALS — BP 161/80 | HR 77 | Temp 97.0°F

## 2024-02-02 DIAGNOSIS — I10 Essential (primary) hypertension: Secondary | ICD-10-CM

## 2024-02-02 DIAGNOSIS — R12 Heartburn: Secondary | ICD-10-CM

## 2024-02-02 DIAGNOSIS — A048 Other specified bacterial intestinal infections: Secondary | ICD-10-CM | POA: Insufficient documentation

## 2024-02-02 MED ORDER — OMEPRAZOLE 20 MG PO CPDR: 20.0000 mg | DELAYED_RELEASE_CAPSULE | Freq: Every day | ORAL | 0 refills | Status: AC | PRN

## 2024-02-02 NOTE — Progress Notes (Signed)
 Established Patient Office Visit  Subjective   Patient ID: Grant Wilson, male    DOB: Dec 12, 1965  Age: 58 y.o. 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  Last appt with [facility] 10/22/23 for his annual.  Some back pain this week also now resolved.  Denied changes in activity/exercise/hobbies/fever/chills/n/v/cold recently/cough  has not taken his blood pressure medication in previous 24 hours      Review of Systems  Constitutional:  Negative for chills, diaphoresis, fever and weight loss.  HENT:  Negative for congestion.   Respiratory:  Negative for cough, shortness of breath and wheezing.   Cardiovascular:  Negative for chest pain and leg swelling.  Gastrointestinal:  Positive for abdominal pain, diarrhea and heartburn. Negative for blood in stool, constipation and vomiting.  Genitourinary:  Negative for dysuria.  Musculoskeletal:  Positive for back pain.  Skin:  Negative for itching and rash.  Neurological:  Negative for dizziness, speech change, focal weakness, seizures, weakness and headaches.  Psychiatric/Behavioral:  The patient does not have insomnia.       Objective:     BP (!) 161/80 (BP Location: Left Arm, Patient Position: Sitting, Cuff Size: Normal)   Pulse 77   Temp (!) 97 F (36.1 C) (Tympanic)   SpO2 98%    Physical Exam Vitals and nursing note reviewed.  Constitutional:      General: He is awake. He is not in acute distress.    Appearance: Normal appearance. He is well-developed, well-groomed and normal weight. He is not ill-appearing, toxic-appearing or  diaphoretic.  HENT:     Head: Normocephalic and atraumatic.     Jaw: There is normal jaw occlusion.     Salivary Glands: Right salivary gland is not diffusely enlarged. Left salivary gland is not diffusely enlarged.     Right Ear: Hearing and external ear normal. No decreased hearing noted. No laceration, drainage or swelling.     Left Ear: Hearing and external ear normal. No decreased hearing noted. No laceration, drainage or swelling.     Nose: Nose normal. No congestion or rhinorrhea.     Mouth/Throat:     Lips: Pink. No lesions.     Mouth: Mucous membranes are moist. No oral lesions or angioedema.     Dentition: No gum lesions.     Tongue: No lesions. Tongue does not deviate from midline.     Palate: No mass and lesions.     Pharynx: Oropharynx is clear. Uvula midline. Postnasal drip present. No pharyngeal swelling, oropharyngeal exudate, posterior oropharyngeal erythema or uvula swelling.     Tonsils: No tonsillar exudate.   Eyes:     General: Lids are normal. Vision grossly intact. Gaze aligned appropriately. Allergic shiner present. No scleral icterus.       Right eye: No discharge.        Left eye: No discharge.     Extraocular Movements: Extraocular movements intact.     Right eye: Normal extraocular motion and no nystagmus.     Left eye: Normal extraocular motion and no nystagmus.     Conjunctiva/sclera: Conjunctivae normal.  Pupils: Pupils are equal, round, and reactive to light.   Neck:     Trachea: Trachea and phonation normal.   Cardiovascular:     Rate and Rhythm: Normal rate and regular rhythm.     Pulses: Normal pulses.          Radial pulses are 2+ on the right side and 2+ on the left side.     Heart sounds: Normal heart sounds, S1 normal and S2 normal.  Pulmonary:     Effort: Pulmonary effort is normal. No respiratory distress.     Breath sounds: Normal breath sounds and air entry. No stridor or transmitted upper airway sounds. No decreased breath sounds,  wheezing, rhonchi or rales.     Comments: Spoke full sentences without difficulty; no cough observed in exam room Abdominal:     General: Abdomen is flat. Bowel sounds are decreased. There is no distension.     Palpations: Abdomen is soft. There is no shifting dullness, fluid wave, hepatomegaly, splenomegaly, mass or pulsatile mass.     Tenderness: There is no abdominal tenderness. There is no right CVA tenderness, left CVA tenderness, guarding or rebound. Negative signs include Murphy's sign.     Hernia: No hernia is present. There is no hernia in the umbilical area or ventral area.     Comments: Dull to percussion x 4 quads; hypoactive bowel sounds x 4 quads; standing to sitting to supine and reversed quickly without assistance on exam table   Musculoskeletal:        General: Normal range of motion.     Right hand: No tenderness. Normal capillary refill.     Left hand: No tenderness. Normal capillary refill.     Cervical back: Normal range of motion and neck supple. No swelling, edema, deformity, erythema, signs of trauma, lacerations, rigidity, spasms, torticollis, tenderness or crepitus. No pain with movement or muscular tenderness. Normal range of motion.     Thoracic back: No swelling, edema, deformity, signs of trauma, lacerations, spasms or tenderness. Normal range of motion.     Lumbar back: No swelling, edema, deformity, signs of trauma, lacerations, spasms or tenderness. Normal range of motion.     Right lower leg: No edema.     Left lower leg: No edema.  Lymphadenopathy:     Head:     Right side of head: No submental, submandibular, tonsillar, preauricular, posterior auricular or occipital adenopathy.     Left side of head: No submental, submandibular, tonsillar, preauricular, posterior auricular or occipital adenopathy.     Cervical: No cervical adenopathy.     Right cervical: No superficial, deep or posterior cervical adenopathy.    Left cervical: No superficial, deep or  posterior cervical adenopathy.   Skin:    General: Skin is warm and dry.     Capillary Refill: Capillary refill takes less than 2 seconds.     Coloration: Skin is not ashen, cyanotic, jaundiced, mottled, pale or sallow.     Findings: No abrasion, abscess, acne, bruising, burn, ecchymosis, erythema, signs of injury, laceration, lesion, petechiae, rash or wound.     Comments: Anterior face/neck/hands visually inspected   Neurological:     General: No focal deficit present.     Mental Status: He is alert and oriented to person, place, and time. Mental status is at baseline.     GCS: GCS eye subscore is 4. GCS verbal subscore is 5. GCS motor subscore is 6.     Cranial Nerves: Cranial nerves 2-12 are  intact. No cranial nerve deficit, dysarthria or facial asymmetry.     Sensory: Sensation is intact.     Motor: Motor function is intact. No weakness, tremor, atrophy, abnormal muscle tone or seizure activity.     Coordination: Coordination is intact. Coordination normal.     Gait: Gait is intact. Gait normal.     Comments: In/out of chair and on/off exam table without difficulty; gait sure and steady in clinic; bilateral hand grasp equal 5/5  Psychiatric:        Attention and Perception: Attention and perception normal.        Mood and Affect: Mood and affect normal.        Speech: Speech normal.        Behavior: Behavior normal. Behavior is cooperative.        Thought Content: Thought content normal.        Cognition and Memory: Cognition and memory normal.        Judgment: Judgment normal.     Latest Reference Range & Units 10/22/23 10:24  Comprehensive metabolic panel with GFR  Rpt  Sodium 135 - 145 mEq/L 137  Potassium 3.5 - 5.1 mEq/L 4.3  Chloride 96 - 112 mEq/L 101  CO2 19 - 32 mEq/L 29  Glucose 70 - 99 mg/dL 91  BUN 6 - 23 mg/dL 7  Creatinine 9.14 - 7.82 mg/dL 9.56  Calcium  8.4 - 10.5 mg/dL 9.6  Alkaline Phosphatase 39 - 117 U/L 46  Albumin 3.5 - 5.2 g/dL 4.7  AST 0 - 37 U/L 30   ALT 0 - 53 U/L 24  Total Protein 6.0 - 8.3 g/dL 7.8  Total Bilirubin 0.2 - 1.2 mg/dL 0.6  GFR >21.30 mL/min 86.39  Total CHOL/HDL Ratio  4  Cholesterol 0 - 200 mg/dL 865 (H)  HDL Cholesterol >39.00 mg/dL 78.46  LDL (calc) 0 - 99 mg/dL 962 (H)  NonHDL  952.84  Triglycerides 0.0 - 149.0 mg/dL 13.2  VLDL 0.0 - 44.0 mg/dL 10.2  VITD 72.53 - 664.40 ng/mL 41.22  Vitamin B12 211 - 911 pg/mL 226  WBC 4.0 - 10.5 K/uL 3.2 (L)  RBC 4.22 - 5.81 Mil/uL 4.86  Hemoglobin 13.0 - 17.0 g/dL 34.7  HCT 42.5 - 95.6 % 47.1  MCV 78.0 - 100.0 fl 97.0  MCHC 30.0 - 36.0 g/dL 38.7  RDW 56.4 - 33.2 % 14.0  Platelets 150.0 - 400.0 K/uL 205.0  Neutrophils 43.0 - 77.0 % 49.0  Lymphocytes 12.0 - 46.0 % 36.1  Monocytes Relative 3.0 - 12.0 % 12.9 (H)  Eosinophil 0.0 - 5.0 % 1.3  Basophil 0.0 - 3.0 % 0.7  NEUT# 1.4 - 7.7 K/uL 1.6  Lymphs Abs 0.7 - 4.0 K/uL 1.1  Monocyte # 0.1 - 1.0 K/uL 0.4  Eosinophils Absolute 0.0 - 0.7 K/uL 0.0  Basophils Absolute 0.0 - 0.1 K/uL 0.0  (H): Data is abnormally high (L): Data is abnormally low Rpt: View report in Results Review for more information   No results found for any visits on 02/02/24.    The 10-year ASCVD risk score (Arnett DK, et al., 2019) is: 12.2%    Assessment & Plan:   Problem List Items Addressed This Visit       Cardiovascular and Mediastinum   Primary hypertension - Primary   Other Visit Diagnoses       Heartburn          Refilled omeprazole  DR 20mg  po daily prn heartburn #90 RF0 from [facility] Avoid NSAIDS  use e.g. motrin /advil /alevel/naprosyn /naproxen /ibuprofen .  Anti-reflux measures such as raising the head of the bed, avoiding tight clothing or belts, avoiding eating late at night and not lying down shortly after mealtime and achieving weight loss were discussed. Avoid ASA, NSAID's, caffeine, peppermints, alcohol and tobacco. OTC H2 blockers and/or antacids are often very helpful for PRN use.  However, for chronic or daily symptoms,  prescription strength H2 blockers or a trial of PPI's should be used.  Patient should alert me if there are persistent symptoms, dysphagia, weight loss or GI bleeding (bright red or black). Follow up with clinic provider or PCM if symptoms persist despite therapy. Exitcare handouts on hepatitis B, heartburn If chronic changes in stools follow up with PCM/GI as history of chronic hepatitis B on tenovir. Patient verbalized agreement and understanding of treatment plan and had no further questions at this time.     Discussed with patient should be needing refill hydrochlorothiazide  as last fill 11/17/23 from [facility] 25mg  90 tabs.  NP will be on vacation starting 20 Jun to count pills at home to ensure he has 3 weeks remaining e.g. 21 pills so does not run out before my return to fill from [facility].  Take 1 pill daily as blood pressure today elevated increases risk for stroke/heart attack/vessel damage.  Exitcare handout dash diet/managing hypertension.  Last labs 10/22/23 next due Mar 2026.discussed overdue optometry evaluation schedule appt with provider of his choice  Patient verbalized understanding and had no further questions at this time.   P2:  Diet, Food avoidance that aggravate condition, and Fitness   No follow-ups on file.    Richardine Chancy, NP

## 2024-02-02 NOTE — Patient Instructions (Addendum)
 Hepatitis B: What to Know  Hepatitis B is a liver infection that's caused by a germ, also called a virus. The germ can spread from person to person. This means it's contagious. The infection causes irritation and swelling in the liver. There are two kinds of hepatitis B: Acute hepatitis B. This may last for 6 months or less. Long-term, or chronic, hepatitis B. This lasts for more than 6 months. It can lead to serious liver problems. Most adults with acute hepatitis B don't get chronic hepatitis B. Babies and young children who get hepatitis B are more likely to develop chronic hepatitis B than adults. What are the causes? This condition is caused by a germ. The germ spreads through contact with blood or other body fluids such as: Semen. Vaginal fluids. Breast milk. The germ doesn't spread when a person coughs or sneezes. What increases the risk? Having contact with needles or syringes that have the germ on them. This may happen when: Injecting drugs. Getting a tattoo or body piercing. Getting a treatment where thin needles are put in your skin. This is called acupuncture. Not using a condom during vaginal, anal, or oral sex. Living with or having close contact with a person who has hepatitis B. Working in a job where you have contact with blood or body fluids, such as in health care. Traveling to a country where this condition is common. Getting kidney dialysis. This is treatment to clean your blood. Having been given donated blood (blood transfusion) or an organ transplant. What are the signs or symptoms? Not wanting to eat as much as normal. A fever. Feeling tired. Throwing up or feeling like you may throw up. Belly pain or joint pain. Dark yellow pee (urine), light-colored poop, or both. Jaundice. This is when your skin or the white parts of your eyes turn yellow. Itchy skin. Often, there are no symptoms. How is this diagnosed? This condition is diagnosed based on: A physical  exam. Your medical history. Blood tests. How is this treated? Treatment may depend on your condition and whether you have liver damage. It may include taking antiviral medicine. This may help: Lower your risk of serious liver problems. Lower the risk that you'll spread the germ to others. You'll need to avoid taking certain medicines that can lead to liver damage. Follow these instructions at home: Medicines Take medicines only as told. If you were given an antiviral medicine, take it as told. Do not stop taking it even if you start to feel better. Do not take: Over-the-counter medicines that have acetaminophen  in them. New medicines unless your health care provider says it's OK. This includes over-the-counter medicines or supplements. Activity Rest as needed. Use a condom every time you have vaginal, oral, or anal sex. Ask what things are safe for you to do at home. Ask when you can go back to work or school. Avoid swimming and using hot tubs until your provider says this is OK. Eating and drinking Drink more fluids as told. Do not drink alcohol. If you feel like you may throw up or you do throw up, try to: Eat bland, easy-to-digest foods in small amounts as you are able. These foods include bananas, applesauce, rice, lean meats, toast, and crackers. Drink clear fluids slowly and in small amounts as you are able. Clear fluids include water, low-calorie sports drinks, and fruit juice that has water added (diluted fruit juice). You can also try sucking on ice chips. General instructions Do not share toothbrushes, nail  clippers, or razors. Wash your hands often with soap and water for at least 20 seconds. If you can't use soap and water, use alcohol-based hand sanitizer. Make sure you wash your hands: After you use the bathroom. After you change diapers. Before you touch food or water. Tell your provider about people you live with or have close contact with. They may need the hepatitis  B shot (vaccine). Cover any cuts or open sores on your skin. This helps keep you from spreading the germ. Keep all follow-up visits. You may need to have tests to make sure your liver is healing. Where to find more information To learn more, go to these websites: Centers for Disease Control and Prevention at TonerPromos.no. Then: Click Search and type hepatitis B. Find the link you need. World Health Organization: VisitDestination.com.br Contact a health care provider if: Your symptoms get worse. You have new symptoms. This information is not intended to replace advice given to you by your health care provider. Make sure you discuss any questions you have with your health care provider. Document Revised: 06/16/2023 Document Reviewed: 03/10/2023 Elsevier Patient Education  2024 [employer].Heartburn Heartburn is when you feel pain or discomfort in your throat or chest. It may feel like a burning pain. It can also cause a bad, acid-like taste in your mouth. It may feel worse: When you lie down. When you bend over. At night. Heartburn may be caused by the contents in your stomach moving back up into your esophagus. Your esophagus is the part of your body that moves food from your mouth to your stomach. Follow these instructions at home: Eating and drinking Follow an eating plan as told by your health care provider. Avoid certain foods and drinks as told. These may include: Coffee and tea, with or without caffeine. Alcohol. Energy drinks and sports drinks. Fizzy drinks or sodas. Chocolate and cocoa. Peppermint and mint flavorings. Garlic and onions. Horseradish. Spicy and acidic foods. These include: Peppers. Chili powder and curry powder. Vinegar. Hot sauces and BBQ sauce. Citrus fruits and juices. These include: Oranges. Lemons. Limes. Tomato-based foods. These include: Red sauce and pizza with red sauce. Chili. Salsa. Fried and fatty foods. These include: Donuts. [place] fries. Potato  chips. High-fat dressings. High-fat meats. These include: Hot dogs and sausage. Rib eye steak. Ham and bacon. High-fat dairy items. These include: Whole milk. Butter. Cream cheese. Eat small meals often. Avoid eating big meals. Avoid drinking lots of liquid with your meals. Try not to eat meals during the 2-3 hours before bedtime. Try not to lie down right after you eat. Do not exercise right after you eat. Lifestyle  If you're overweight, lose an amount of weight that's healthy for you. Ask your provider about a safe weight loss goal. Do not smoke, vape, or use nicotine or tobacco. These can make your symptoms worse. If you need help quitting, talk with your provider. Wear loose clothes. Do not wear things that are tight around your waist. When you sleep, try: Raising the head of your bed about 6 inches (15 cm). You can use a wedge to do this. Lying down on your left side. Try to lower your stress. If you need help doing this, ask your provider. Medicines Take your medicines only as told by your provider. Do not take aspirin or ibuprofen  unless you're told to. Stop medicines only as told by your provider. If you stop taking some medicines too quickly, your symptoms may get worse. General instructions Watch for  any changes in your symptoms. Contact a health care provider if: You have new symptoms. You have weight loss for no known reason. You have trouble swallowing. It hurts to swallow. You have wheezing. This is when you make high-pitched whistling sounds when you breathe, most often when you breathe out. You have a cough that won't go away. Your symptoms don't get better with treatment. You have heartburn often for more than 2 weeks. Get help right away if: You have pain all of a sudden in your: Arms. Neck. Jaw. Teeth. Back. You feel sweaty, dizzy, or light-headed all of a sudden. You have chest pain or shortness of breath. You vomit and your vomit looks like blood  or coffee grounds. Your poop is bloody or black. These symptoms may be an emergency. Call 911 right away. Do not wait to see if the symptoms will go away. Do not drive yourself to the hospital. This information is not intended to replace advice given to you by your health care provider. Make sure you discuss any questions you have with your health care provider. Document Revised: 06/16/2023 Document Reviewed: 12/26/2022 Elsevier Patient Education  2024 Else[employer] Inc.Managing Your Hypertension Hypertension, also called high blood pressure, is when the force of the blood pressing against the walls of the arteries is too strong. Arteries are blood vessels that carry blood from your heart throughout your body. Hypertension forces the heart to work harder to pump blood and may cause the arteries to become narrow or stiff. Understanding blood pressure readings A blood pressure reading includes a higher number over a lower number: The first, or top, number is called the systolic pressure. It is a measure of the pressure in your arteries as your heart beats. The second, or bottom number, is called the diastolic pressure. It is a measure of the pressure in your arteries as the heart relaxes. For most people, a normal blood pressure is below 120/80. Your personal target blood pressure may vary depending on your medical conditions, your age, and other factors. Blood pressure is classified into four stages. Based on your blood pressure reading, your health care provider may use the following stages to determine what type of treatment you need, if any. Systolic pressure and diastolic pressure are measured in a unit called millimeters of mercury (mmHg). Normal Systolic pressure: below 120. Diastolic pressure: below 80. Elevated Systolic pressure: 120-129. Diastolic pressure: below 80. Hypertension stage 1 Systolic pressure: 130-139. Diastolic pressure: 80-89. Hypertension stage 2 Systolic pressure: 140 or  above. Diastolic pressure: 90 or above. How can this condition affect me? Managing your hypertension is very important. Over time, hypertension can damage the arteries and decrease blood flow to parts of the body, including the brain, heart, and kidneys. Having untreated or uncontrolled hypertension can lead to: A heart attack. A stroke. A weakened blood vessel (aneurysm). Heart failure. Kidney damage. Eye damage. Memory and concentration problems. Vascular dementia. What actions can I take to manage this condition? Hypertension can be managed by making lifestyle changes and possibly by taking medicines. Your health care provider will help you make a plan to bring your blood pressure within a normal range. You may be referred for counseling on a healthy diet and physical activity. Nutrition  Eat a diet that is high in fiber and potassium, and low in salt (sodium), added sugar, and fat. An example eating plan is called the DASH diet. DASH stands for Dietary Approaches to Stop Hypertension. To eat this way: Eat plenty of fresh  fruits and vegetables. Try to fill one-half of your plate at each meal with fruits and vegetables. Eat whole grains, such as whole-wheat pasta, brown rice, or whole-grain bread. Fill about one-fourth of your plate with whole grains. Eat low-fat dairy products. Avoid fatty cuts of meat, processed or cured meats, and poultry with skin. Fill about one-fourth of your plate with lean proteins such as fish, chicken without skin, beans, eggs, and tofu. Avoid pre-made and processed foods. These tend to be higher in sodium, added sugar, and fat. Reduce your daily sodium intake. Many people with hypertension should eat less than 1,500 mg of sodium a day. Lifestyle  Work with your health care provider to maintain a healthy body weight or to lose weight. Ask what an ideal weight is for you. Get at least 30 minutes of exercise that causes your heart to beat faster (aerobic exercise)  most days of the week. Activities may include walking, swimming, or biking. Include exercise to strengthen your muscles (resistance exercise), such as weight lifting, as part of your weekly exercise routine. Try to do these types of exercises for 30 minutes at least 3 days a week. Do not use any products that contain nicotine or tobacco. These products include cigarettes, chewing tobacco, and vaping devices, such as e-cigarettes. If you need help quitting, ask your health care provider. Control any long-term (chronic) conditions you have, such as high cholesterol or diabetes. Identify your sources of stress and find ways to manage stress. This may include meditation, deep breathing, or making time for fun activities. Alcohol use Do not drink alcohol if: Your health care provider tells you not to drink. You are pregnant, may be pregnant, or are planning to become pregnant. If you drink alcohol: Limit how much you have to: 0-1 drink a day for women. 0-2 drinks a day for men. Know how much alcohol is in your drink. In the [place] one drink equals one 12 oz bottle of beer (355 mL), one 5 oz glass of wine (148 mL), or one 1 oz glass of hard liquor (44 mL). Medicines Your health care provider may prescribe medicine if lifestyle changes are not enough to get your blood pressure under control and if: Your systolic blood pressure is 130 or higher. Your diastolic blood pressure is 80 or higher. Take medicines only as told by your health care provider. Follow the directions carefully. Blood pressure medicines must be taken as told by your health care provider. The medicine does not work as well when you skip doses. Skipping doses also puts you at risk for problems. Monitoring Before you monitor your blood pressure: Do not smoke, drink caffeinated beverages, or exercise within 30 minutes before taking a measurement. Use the bathroom and empty your bladder (urinate). Sit quietly for at least 5 minutes  before taking measurements. Monitor your blood pressure at home as told by your health care provider. To do this: Sit with your back straight and supported. Place your feet flat on the floor. Do not cross your legs. Support your arm on a flat surface, such as a table. Make sure your upper arm is at heart level. Each time you measure, take two or three readings one minute apart and record the results. You may also need to have your blood pressure checked regularly by your health care provider. General information Talk with your health care provider about your diet, exercise habits, and other lifestyle factors that may be contributing to hypertension. Review all the medicines you take  with your health care provider because there may be side effects or interactions. Keep all follow-up visits. Your health care provider can help you create and adjust your plan for managing your high blood pressure. Where to find more information National Heart, Lung, and Blood Institute: PopSteam.is American Heart Association: www.heart.org Contact a health care provider if: You think you are having a reaction to medicines you have taken. You have repeated (recurrent) headaches. You feel dizzy. You have swelling in your ankles. You have trouble with your vision. Get help right away if: You develop a severe headache or confusion. You have unusual weakness or numbness, or you feel faint. You have severe pain in your chest or abdomen. You vomit repeatedly. You have trouble breathing. These symptoms may be an emergency. Get help right away. Call 911. Do not wait to see if the symptoms will go away. Do not drive yourself to the hospital. Summary Hypertension is when the force of blood pumping through your arteries is too strong. If this condition is not controlled, it may put you at risk for serious complications. Your personal target blood pressure may vary depending on your medical conditions, your age,  and other factors. For most people, a normal blood pressure is less than 120/80. Hypertension is managed by lifestyle changes, medicines, or both. Lifestyle changes to help manage hypertension include losing weight, eating a healthy, low-sodium diet, exercising more, stopping smoking, and limiting alcohol. This information is not intended to replace advice given to you by your health care provider. Make sure you discuss any questions you have with your health care provider. Document Revised: 04/18/2021 Document Reviewed: 04/18/2021 Elsevier Patient Education  2024 Elsevier Inc.DASH Eating Plan DASH stands for Dietary Approaches to Stop Hypertension. The DASH eating plan is a healthy eating plan that has been shown to: Lower high blood pressure (hypertension). Reduce your risk for type 2 diabetes, heart disease, and stroke. Help with weight loss. What are tips for following this plan? Reading food labels Check food labels for the amount of salt (sodium) per serving. Choose foods with less than 5 percent of the Daily Value (DV) of sodium. In general, foods with less than 300 milligrams (mg) of sodium per serving fit into this eating plan. To find whole grains, look for the word whole as the first word in the ingredient list. Shopping Buy products labeled as low-sodium or no salt added. Buy fresh foods. Avoid canned foods and pre-made or frozen meals. Cooking Try not to add salt when you cook. Use salt-free seasonings or herbs instead of table salt or sea salt. Check with your health care provider or pharmacist before using salt substitutes. Do not fry foods. Cook foods in healthy ways, such as baking, boiling, grilling, roasting, or broiling. Cook using oils that are good for your heart. These include olive, canola, avocado, soybean, and sunflower oil. Meal planning  Eat a balanced diet. This should include: 4 or more servings of fruits and 4 or more servings of vegetables each day. Try  to fill half of your plate with fruits and vegetables. 6-8 servings of whole grains each day. 6 or less servings of lean meat, poultry, or fish each day. 1 oz is 1 serving. A 3 oz (85 g) serving of meat is about the same size as the palm of your hand. One egg is 1 oz (28 g). 2-3 servings of low-fat dairy each day. One serving is 1 cup (237 mL). 1 serving of nuts, seeds, or beans 5  times each week. 2-3 servings of heart-healthy fats. Healthy fats called omega-3 fatty acids are found in foods such as walnuts, flaxseeds, fortified milks, and eggs. These fats are also found in cold-water fish, such as sardines, salmon, and mackerel. Limit how much you eat of: Canned or prepackaged foods. Food that is high in trans fat, such as fried foods. Food that is high in saturated fat, such as fatty meat. Desserts and other sweets, sugary drinks, and other foods with added sugar. Full-fat dairy products. Do not salt foods before eating. Do not eat more than 4 egg yolks a week. Try to eat at least 2 vegetarian meals a week. Eat more home-cooked food and less restaurant, buffet, and fast food. Lifestyle When eating at a restaurant, ask if your food can be made with less salt or no salt. If you drink alcohol: Limit how much you have to: 0-1 drink a day if you are male. 0-2 drinks a day if you are male. Know how much alcohol is in your drink. In the [place] one drink is one 12 oz bottle of beer (355 mL), one 5 oz glass of wine (148 mL), or one 1 oz glass of hard liquor (44 mL). General information Avoid eating more than 2,300 mg of salt a day. If you have hypertension, you may need to reduce your sodium intake to 1,500 mg a day. Work with your provider to stay at a healthy body weight or lose weight. Ask what the best weight range is for you. On most days of the week, get at least 30 minutes of exercise that causes your heart to beat faster. This may include walking, swimming, or biking. Work with your  provider or dietitian to adjust your eating plan to meet your specific calorie needs. What foods should I eat? Fruits All fresh, dried, or frozen fruit. Canned fruits that are in their natural juice and do not have sugar added to them. Vegetables Fresh or frozen vegetables that are raw, steamed, roasted, or grilled. Low-sodium or reduced-sodium tomato and vegetable juice. Low-sodium or reduced-sodium tomato sauce and tomato paste. Low-sodium or reduced-sodium canned vegetables. Grains Whole-grain or whole-wheat bread. Whole-grain or whole-wheat pasta. Brown rice. Dwyane Glad. Bulgur. Whole-grain and low-sodium cereals. Pita bread. Low-fat, low-sodium crackers. Whole-wheat flour tortillas. Meats and other proteins Skinless chicken or [place]. Ground chicken or [place]. Pork with fat trimmed off. Fish and seafood. Egg whites. Dried beans, peas, or lentils. Unsalted nuts, nut butters, and seeds. Unsalted canned beans. Lean cuts of beef with fat trimmed off. Low-sodium, lean precooked or cured meat, such as sausages or meat loaves. Dairy Low-fat (1%) or fat-free (skim) milk. Reduced-fat, low-fat, or fat-free cheeses. Nonfat, low-sodium ricotta or cottage cheese. Low-fat or nonfat yogurt. Low-fat, low-sodium cheese. Fats and oils Soft margarine without trans fats. Vegetable oil. Reduced-fat, low-fat, or light mayonnaise and salad dressings (reduced-sodium). Canola, safflower, olive, avocado, soybean, and sunflower oils. Avocado. Seasonings and condiments Herbs. Spices. Seasoning mixes without salt. Other foods Unsalted popcorn and pretzels. Fat-free sweets. The items listed above may not be all the foods and drinks you can have. Talk to a dietitian to learn more. What foods should I avoid? Fruits Canned fruit in a light or heavy syrup. Fried fruit. Fruit in cream or butter sauce. Vegetables Creamed or fried vegetables. Vegetables in a cheese sauce. Regular canned vegetables that are not marked as  low-sodium or reduced-sodium. Regular canned tomato sauce and paste that are not marked as low-sodium or reduced-sodium. Regular tomato  and vegetable juices that are not marked as low-sodium or reduced-sodium. Vanessa General. Olives. Grains Baked goods made with fat, such as croissants, muffins, or some breads. Dry pasta or rice meal packs. Meats and other proteins Fatty cuts of meat. Ribs. Fried meat. Helene Loader. Bologna, salami, and other precooked or cured meats, such as sausages or meat loaves, that are not lean and low in sodium. Fat from the back of a pig (fatback). Bratwurst. Salted nuts and seeds. Canned beans with added salt. Canned or smoked fish. Whole eggs or egg yolks. Chicken or [place] with skin. Dairy Whole or 2% milk, cream, and half-and-half. Whole or full-fat cream cheese. Whole-fat or sweetened yogurt. Full-fat cheese. Nondairy creamers. Whipped toppings. Processed cheese and cheese spreads. Fats and oils Butter. Stick margarine. Lard. Shortening. Ghee. Bacon fat. Tropical oils, such as coconut, palm kernel, or palm oil. Seasonings and condiments Onion salt, garlic salt, seasoned salt, table salt, and sea salt. Worcestershire sauce. Tartar sauce. Barbecue sauce. Teriyaki sauce. Soy sauce, including reduced-sodium soy sauce. Steak sauce. Canned and packaged gravies. Fish sauce. Oyster sauce. Cocktail sauce. Store-bought horseradish. Ketchup. Mustard. Meat flavorings and tenderizers. Bouillon cubes. Hot sauces. Pre-made or packaged marinades. Pre-made or packaged taco seasonings. Relishes. Regular salad dressings. Other foods Salted popcorn and pretzels. The items listed above may not be all the foods and drinks you should avoid. Talk to a dietitian to learn more. Where to find more information National Heart, Lung, and Blood Institute (NHLBI): BuffaloDryCleaner.gl American Heart Association (AHA): heart.org Academy of Nutrition and Dietetics: eatright.org National Kidney Foundation (NKF):  kidney.org This information is not intended to replace advice given to you by your health care provider. Make sure you discuss any questions you have with your health care provider. Document Revised: 08/21/2022 Document Reviewed: 08/21/2022 Elsevier Patient Education  2024 [employer].

## 2024-02-02 NOTE — Progress Notes (Signed)
 Abdominal issues, with headache,  loose stools. And c/o pain of right side mid back scale of 4-5.

## 2024-02-04 ENCOUNTER — Telehealth: Payer: Self-pay | Admitting: Student

## 2024-02-04 ENCOUNTER — Encounter: Payer: Self-pay | Admitting: Student

## 2024-02-04 DIAGNOSIS — I10 Essential (primary) hypertension: Secondary | ICD-10-CM

## 2024-02-04 MED ORDER — HYDROCHLOROTHIAZIDE 25 MG PO TABS: 25.0000 mg | ORAL_TABLET | Freq: Every day | ORAL | 0 refills | Status: DC

## 2024-02-04 NOTE — Telephone Encounter (Signed)
 Last hydrochlorothiazide  25mg  refill on 11/17/2023 - 90 tablets.   Latest Reference Range & Units 10/22/23 10:24  Comprehensive metabolic panel with GFR  Rpt  Sodium 135 - 145 mEq/L 137  Potassium 3.5 - 5.1 mEq/L 4.3  Chloride 96 - 112 mEq/L 101  CO2 19 - 32 mEq/L 29  Glucose 70 - 99 mg/dL 91  BUN 6 - 23 mg/dL 7  Creatinine 1.19 - 1.47 mg/dL 8.29  Calcium  8.4 - 10.5 mg/dL 9.6  Alkaline Phosphatase 39 - 117 U/L 46  Albumin 3.5 - 5.2 g/dL 4.7  AST 0 - 37 U/L 30  ALT 0 - 53 U/L 24  Total Protein 6.0 - 8.3 g/dL 7.8  Total Bilirubin 0.2 - 1.2 mg/dL 0.6  GFR >56.21 mL/min 86.39  Total CHOL/HDL Ratio  4  Cholesterol 0 - 200 mg/dL 308 (H)  HDL Cholesterol >39.00 mg/dL 65.78  LDL (calc) 0 - 99 mg/dL 469 (H)  NonHDL  629.52  Triglycerides 0.0 - 149.0 mg/dL 84.1  VLDL 0.0 - 32.4 mg/dL 40.1  VITD 02.72 - 536.64 ng/mL 41.22  Vitamin B12 211 - 911 pg/mL 226  WBC 4.0 - 10.5 K/uL 3.2 (L)  RBC 4.22 - 5.81 Mil/uL 4.86  Hemoglobin 13.0 - 17.0 g/dL 40.3  HCT 47.4 - 25.9 % 47.1  MCV 78.0 - 100.0 fl 97.0  MCHC 30.0 - 36.0 g/dL 56.3  RDW 87.5 - 64.3 % 14.0  Platelets 150.0 - 400.0 K/uL 205.0  Neutrophils 43.0 - 77.0 % 49.0  Lymphocytes 12.0 - 46.0 % 36.1  Monocytes Relative 3.0 - 12.0 % 12.9 (H)  Eosinophil 0.0 - 5.0 % 1.3  Basophil 0.0 - 3.0 % 0.7  NEUT# 1.4 - 7.7 K/uL 1.6  Lymphs Abs 0.7 - 4.0 K/uL 1.1  Monocyte # 0.1 - 1.0 K/uL 0.4  Eosinophils Absolute 0.0 - 0.7 K/uL 0.0  Basophils Absolute 0.0 - 0.1 K/uL 0.0  Hemoglobin A1C 4.6 - 6.5 % 6.0  TSH 0.35 - 5.50 uIU/mL 1.00  RPR NON-REACTIVE  NON-REACTIVE  Hepatitis C Ab NON-REACTIVE  NON-REACTIVE  HIV NON-REACTIVE  NON-REACTIVE  Appearance Clear;Turbid;Slightly Cloudy;Cloudy  CLEAR  Bilirubin Urine Negative  NEGATIVE  Color, Urine Yellow;Lt. Yellow;Straw;Dark Yellow;Amber;Green;Red;Brown  YELLOW  Hgb urine dipstick Negative  NEGATIVE  Ketones, ur Negative  NEGATIVE  Leukocytes,Ua Negative  NEGATIVE  Nitrite Negative  NEGATIVE  pH 5.0  - 8.0  8.0  Specific Gravity, Urine 1.000 - 1.030  1.010  Urine Glucose Negative  NEGATIVE  Urobilinogen, UA 0.0 - 1.0  0.2  Total Protein, Urine-UPE24 Negative  NEGATIVE  (H): Data is abnormally high (L): Data is abnormally low Rpt: View report in Results Review for more information  Last BP 02/02/2024   160/80 (had not taken medications that day)

## 2024-03-10 ENCOUNTER — Ambulatory Visit: Payer: Self-pay | Admitting: Registered Nurse

## 2024-03-10 ENCOUNTER — Encounter: Payer: Self-pay | Admitting: Registered Nurse

## 2024-03-10 VITALS — BP 151/79 | HR 72 | Resp 16

## 2024-03-10 DIAGNOSIS — M25551 Pain in right hip: Secondary | ICD-10-CM

## 2024-03-10 DIAGNOSIS — M7631 Iliotibial band syndrome, right leg: Secondary | ICD-10-CM

## 2024-03-10 NOTE — Patient Instructions (Addendum)
 Hip Exercises Ask your health care provider which exercises are safe for you. Do exercises exactly as told by your provider and adjust them as told. It is normal to feel mild stretching, pulling, tightness, or discomfort as you do these exercises. Stop right away if you feel sudden pain or your pain gets worse. Do not begin these exercises until told by your provider. Stretching and range-of-motion exercises These exercises warm up your muscles and joints and improve the movement and flexibility of your hip. They also help to relieve pain, numbness, and tingling. You may be asked to limit your range of motion if you had a hip replacement. Talk to your provider about these limits. Hamstrings, supine  Lie on your back (supine position). Loop a belt, towel, or exercise band over the ball of your left / right foot. The ball of your foot is on the walking surface, right under your toes. Straighten your left / right knee and slowly pull on the belt, towel, or band to raise your leg until you feel a gentle stretch behind your knee (hamstring). Do not let your knee bend while you do this. Keep your other leg flat on the floor. Hold this position for __30________ seconds. Slowly return your leg to the starting position. Repeat ___3_______ times. Complete this exercise _____2_____ times a day. Hip rotation  Lie on your back on a firm surface. With your left / right hand, gently pull your left / right knee toward the shoulder that is on the same side of the body. Stop when your knee is pointing toward the ceiling. Hold your left / right ankle with your other hand. Keeping your knee steady, gently pull your left / right ankle toward your other shoulder until you feel a stretch in your butt. Keep your hips and shoulders firmly planted while you do this stretch. Hold this position for ____30______ seconds. Repeat ______3____ times. Complete this exercise _____2_____ times a day. Seated stretch This  exercise is sometimes called hamstrings and adductors stretch. Sit on the floor with your legs stretched wide. Keep your knees straight during this exercise. Keeping your head and back in a straight line, bend at your waist to reach for your left foot (position A). You should feel a stretch in your right inner thigh (adductors). Hold this position for ___30_______ seconds. Then slowly return to the upright position. Keeping your head and back in a straight line, bend at your waist to reach forward (position B). You should feel a stretch behind both of your thighs and knees (hamstrings). Hold this position for _____30_____ seconds. Then slowly return to the upright position. Keeping your head and back in a straight line, bend at your waist to reach for your right foot (position C). You should feel a stretch in your left inner thigh (adductors). Hold this position for _____30_____ seconds. Then slowly return to the upright position. Repeat ____3______ times. Complete this exercise ____2______ times a day. Lunge This exercise stretches the muscles of the hip (hip flexors). Place your left / right knee on the floor and bend your other knee so that is directly over your ankle. You should be half-kneeling. Keep good posture with your head over your shoulders. Tighten your butt muscles to point your tailbone downward. This will prevent your back from arching too much. You should feel a gentle stretch in the front of your left / right thigh and hip. If you do not feel a stretch, slide your other foot forward slightly and  then slowly lunge forward with your chest up until your knee once again lines up over your ankle. Make sure your tailbone continues to point downward. Hold this position for ___30_______ seconds. Slowly return to the starting position. Repeat ___3_______ times. Complete this exercise ___2_______ times a day. Strengthening exercises These exercises build strength and endurance in your  hip. Endurance is the ability to use your muscles for a long time, even after they get tired. Bridge This exercise strengthens the muscles of your hip (hip extensors). Lie on your back on a firm surface with your knees bent and your feet flat on the floor. Tighten your butt muscles and lift your bottom off the floor until the trunk of your body and your hips are level with your thighs. Do not arch your back. You should feel the muscles working in your butt and the back of your thighs. If you do not feel these muscles, slide your feet 1-2 inches (2.5-5 cm) farther away from your butt. Hold this position for ____30______ seconds. Slowly lower your hips to the starting position. Let your muscles relax completely between repetitions. Repeat ____3______ times. Complete this exercise ____2______ times a day. Straight leg raises, side-lying This exercise strengthens the muscles that move the hip joint away from the center of the body (hip abductors). Lie on your side with your left / right leg in the top position. Lie so your head, shoulder, hip, and knee line up. You may bend your bottom knee slightly to help you balance. Roll your hips slightly forward, so your hips are stacked directly over each other and your left / right knee is facing forward. Leading with your heel, lift your top leg 4-6 inches (10-15 cm). You should feel the muscles in your top hip lifting. Do not let your foot drift forward. Do not let your knee roll toward the ceiling. Hold this position for _____30_____ seconds. Slowly return to the starting position. Let your muscles relax completely between repetitions. Repeat _____3_____ times. Complete this exercise __2________ times a day. Straight leg raises, side-lying This exercise strengthens the muscles that move the hip joint toward the center of the body (hip adductors). Lie on your side with your left / right leg in the bottom position. Lie so your head, shoulder, hip, and  knee line up. You may place your upper foot in front to help you balance. Roll your hips slightly forward, so your hips are stacked directly over each other and your left / right knee is facing forward. Tense the muscles in your inner thigh and lift your bottom leg 4-6 inches (10-15 cm). Hold this position for __30________ seconds. Slowly return to the starting position. Let your muscles relax completely between repetitions. Repeat ____3______ times. Complete this exercise ___2_______ times a day. Straight leg raises, supine This exercise strengthens the muscles in the front of your thigh (quadriceps and hip flexors). Lie on your back (supine position) with your left / right leg extended and your other knee bent. Tense the muscles in the front of your left / right thigh. You should see your kneecap slide up or see increased dimpling just above your knee. Keep these muscles tight as you raise your leg 4-6 inches (10-15 cm) off the floor. Do not let your knee bend. Hold this position for _____30_____ seconds. Keep these muscles tense as you lower your leg. Relax the muscles slowly and completely between repetitions. Repeat _____3_____ times. Complete this exercise ____2______ times a day. Hip abductors, standing This  exercise strengthens the muscles that move the leg and hip joint away from the center of the body (hip abductors). Tie one end of a rubber exercise band or tubing to a secure surface, such as a chair, table, or pole. Loop the other end of the band or tubing around your left / right ankle. Keeping your ankle with the band or tubing directly opposite the secured end, step away until there is tension in the tubing or band. Hold on to a chair, table, or pole as needed for balance. Lift your left / right leg out to your side. While you do this: Keep your back upright. Keep your shoulders over your hips. Keep your toes pointing forward. Make sure to use your hip muscles to slowly lift  your leg. Do not tip your body or forcefully lift your leg. Hold this position for _____30_____ seconds. Slowly return to the starting position. Repeat _____3_____ times. Complete this exercise _____2_____ times a day. Squats This exercise strengthens the muscles in the front of your thigh (quadriceps). Stand in front of a table, or stand in a doorframe so your feet and knees are in line with the frame. You may place your hands on the table or frame for balance. Slowly bend your knees and lower your hips like you are going to sit in a chair. Keep your lower legs in a straight up-and-down position. Do not let your hips go lower than your knees. Do not bend your knees lower than told by your provider. If your hip pain increases, do not bend as low. Hold this position for _____30______ seconds. Slowly push with your legs to return to standing. Do not use your hands to pull yourself to standing. Repeat __3________ times. Complete this exercise _____2_____ times a day. This information is not intended to replace advice given to you by your health care provider. Make sure you discuss any questions you have with your health care provider. Document Revised: 04/08/2022 Document Reviewed: 04/08/2022 [employer] Patient Education  2024 [employer]Low Back Sprain or Strain Rehab Ask your health care provider which exercises are safe for you. Do exercises exactly as told by your health care provider and adjust them as directed. It is normal to feel mild stretching, pulling, tightness, or discomfort as you do these exercises. Stop right away if you feel sudden pain or your pain gets worse. Do not begin these exercises until told by your health care provider. Stretching and range-of-motion exercises These exercises warm up your muscles and joints and improve the movement and flexibility of your back. These exercises also help to relieve pain, numbness, and tingling. Lumbar rotation  Lie on your back on a  firm bed or the floor with your knees bent. Straighten your arms out to your sides so each arm forms a 90-degree angle (right angle) with a side of your body. Slowly move (rotate) both of your knees to one side of your body until you feel a stretch in your lower back (lumbar). Try not to let your shoulders lift off the floor. Hold this position for ____30______ seconds. Tense your abdominal muscles and slowly move your knees back to the starting position. Repeat this exercise on the other side of your body. Repeat ______3____ times. Complete this exercise _____2_____ times a day. Single knee to chest  Lie on your back on a firm bed or the floor with both legs straight. Bend one of your knees. Use your hands to move your knee up toward your chest  until you feel a gentle stretch in your lower back and buttock. Hold your leg in this position by holding on to the front of your knee. Keep your other leg as straight as possible. Hold this position for ____30______ seconds. Slowly return to the starting position. Repeat with your other leg. Repeat ___3_______ times. Complete this exercise ___3_______ times a day. Prone extension on elbows  Lie on your abdomen on a firm bed or the floor (prone position). Prop yourself up on your elbows. Use your arms to help lift your chest up until you feel a gentle stretch in your abdomen and your lower back. This will place some of your body weight on your elbows. If this is uncomfortable, try stacking pillows under your chest. Your hips should stay down, against the surface that you are lying on. Keep your hip and back muscles relaxed. Hold this position for _____30_____ seconds. Slowly relax your upper body and return to the starting position. Repeat ______3____ times. Complete this exercise ______2____ times a day. Strengthening exercises These exercises build strength and endurance in your back. Endurance is the ability to use your muscles for a long time,  even after they get tired. Pelvic tilt This exercise strengthens the muscles that lie deep in the abdomen. Lie on your back on a firm bed or the floor with your legs extended. Bend your knees so they are pointing toward the ceiling and your feet are flat on the floor. Tighten your lower abdominal muscles to press your lower back against the floor. This motion will tilt your pelvis so your tailbone points up toward the ceiling instead of pointing to your feet or the floor. To help with this exercise, you may place a small towel under your lower back and try to push your back into the towel. Hold this position for ____30______ seconds. Let your muscles relax completely before you repeat this exercise. Repeat ____3______ times. Complete this exercise ____2______ times a day. Alternating arm and leg raises  Get on your hands and knees on a firm surface. If you are on a hard floor, you may want to use padding, such as an exercise mat, to cushion your knees. Line up your arms and legs. Your hands should be directly below your shoulders, and your knees should be directly below your hips. Lift your left leg behind you. At the same time, raise your right arm and straighten it in front of you. Do not lift your leg higher than your hip. Do not lift your arm higher than your shoulder. Keep your abdominal and back muscles tight. Keep your hips facing the ground. Do not arch your back. Keep your balance carefully, and do not hold your breath. Hold this position for _____30_____ seconds. Slowly return to the starting position. Repeat with your right leg and your left arm. Repeat ____3______ times. Complete this exercise _____2_____ times a day. Abdominal set with straight leg raise  Lie on your back on a firm bed or the floor. Bend one of your knees and keep your other leg straight. Tense your abdominal muscles and lift your straight leg up, 4-6 inches (10-15 cm) off the ground. Keep your abdominal  muscles tight and hold this position for _____30_____ seconds. Do not hold your breath. Do not arch your back. Keep it flat against the ground. Keep your abdominal muscles tense as you slowly lower your leg back to the starting position. Repeat with your other leg. Repeat ___3_______ times. Complete this exercise ____2______ times  a day. Single leg lower with bent knees Lie on your back on a firm bed or the floor. Tense your abdominal muscles and lift your feet off the floor, one foot at a time, so your knees and hips are bent in 90-degree angles (right angles). Your knees should be over your hips and your lower legs should be parallel to the floor. Keeping your abdominal muscles tense and your knee bent, slowly lower one of your legs so your toe touches the ground. Lift your leg back up to return to the starting position. Do not hold your breath. Do not let your back arch. Keep your back flat against the ground. Repeat with your other leg. Repeat ___3_______ times. Complete this exercise ____2_____ times a day. Posture and body mechanics Good posture and healthy body mechanics can help to relieve stress in your body's tissues and joints. Body mechanics refers to the movements and positions of your body while you do your daily activities. Posture is part of body mechanics. Good posture means: Your spine is in its natural S-curve position (neutral). Your shoulders are pulled back slightly. Your head is not tipped forward (neutral). Follow these guidelines to improve your posture and body mechanics in your everyday activities. Standing  When standing, keep your spine neutral and your feet about hip-width apart. Keep a slight bend in your knees. Your ears, shoulders, and hips should line up. When you do a task in which you stand in one place for a long time, place one foot up on a stable object that is 2-4 inches (5-10 cm) high, such as a footstool. This helps keep your spine  neutral. Sitting  When sitting, keep your spine neutral and keep your feet flat on the floor. Use a footrest, if necessary, and keep your thighs parallel to the floor. Avoid rounding your shoulders, and avoid tilting your head forward. When working at a desk or a computer, keep your desk at a height where your hands are slightly lower than your elbows. Slide your chair under your desk so you are close enough to maintain good posture. When working at a computer, place your monitor at a height where you are looking straight ahead and you do not have to tilt your head forward or downward to look at the screen. Resting When lying down and resting, avoid positions that are most painful for you. If you have pain with activities such as sitting, bending, stooping, or squatting, lie in a position in which your body does not bend very much. For example, avoid curling up on your side with your arms and knees near your chest (fetal position). If you have pain with activities such as standing for a long time or reaching with your arms, lie with your spine in a neutral position and bend your knees slightly. Try the following positions: Lying on your side with a pillow between your knees. Lying on your back with a pillow under your knees. Lifting  When lifting objects, keep your feet at least shoulder-width apart and tighten your abdominal muscles. Bend your knees and hips and keep your spine neutral. It is important to lift using the strength of your legs, not your back. Do not lock your knees straight out. Always ask for help to lift heavy or awkward objects. This information is not intended to replace advice given to you by your health care provider. Make sure you discuss any questions you have with your health care provider. Document Revised: 12/08/2022 Document Reviewed: 10/22/2020  [employer] Patient Education  2024 [employer]Iliotibial Band Syndrome Rehab Ask your health care provider which exercises  are safe for you. Do exercises exactly as told by your provider and adjust them as told. It's normal to feel mild stretching, pulling, tightness, or discomfort as you do these exercises. Stop right away if you feel sudden pain or your pain gets a lot worse. Do not begin these exercises until told by your provider. Stretching and range-of-motion exercises These exercises warm up your muscles and joints. They also improve the movement and flexibility of your hip and pelvis. Quadriceps stretch, prone  Lie face down (prone) on a firm surface like a bed or padded floor. Bend your left / right knee. Reach back to hold your ankle or pant leg. If you can't reach your ankle or pant leg, use a belt looped around your foot and grab the belt instead. Gently pull your heel toward your butt. Your knee should not slide out to the side. You should feel a stretch in the front of your thigh and knee, also called the quadriceps. Hold this position for __30________ seconds. Repeat ______3____ times. Complete this exercise _____2_____ times a day. Iliotibial band stretch The iliotibial band is a strip of tissue that runs along the outside of your hip down to your knee. Lie on your side with your left / right leg on top. Bend both knees and grab your left / right ankle. Stretch out your bottom arm to help you balance. Slowly bring your top knee back so your thigh goes behind your back. Slowly lower your top leg toward the floor until you feel a gentle stretch on the outside of your left / right hip and thigh. If you don't feel a stretch and your knee won't go farther, place the heel of your other foot on top of your knee and pull your knee down toward the floor with your foot. Hold this position for _____30_____ seconds. Repeat _____3_____ times. Complete this exercise _____2_____ times a day. Strengthening exercises These exercises build strength and endurance in your hip and pelvis. Endurance means your muscles can  keep working even when they're tired. Straight leg raises, side-lying This exercise strengthens the muscles that rotate the leg at the hip and move it away from your body. These muscles are called hip abductors. Lie on your side with your left / right leg on top. Lie so your head, shoulder, hip, and knee line up. You can bend your bottom knee to help you balance. Roll your hips slightly forward so they're stacked directly over each other. Your left / right knee should face forward. Tense the muscles in your outer thigh and hip. Lift your top leg 4-6 inches (10-15 cm) off the ground. Hold this position for _____30_____ seconds. Slowly lower your leg back down to the starting position. Let your muscles fully relax before doing this exercise again. Repeat ___3_______ times. Complete this exercise ______2____ times a day. Leg raises, prone This exercise strengthens the muscles that move the hips backward. These muscles are called hip extensors. Lie face down (prone) on your bed or a firm surface. You can put a pillow under your hips for comfort and to support your lower back. Bend your left / right knee so your foot points straight up toward the ceiling. Keep the other leg straight and behind you. Squeeze your butt muscles. Lift your left / right thigh off the firm surface. Do not let your back arch. Tense your thigh muscle  as hard as you can without having more knee pain. Hold this position for _____30_____ seconds. Slowly lower your leg to the starting position. Allow your leg to relax all the way. Repeat _____3_____ times. Complete this exercise ____2______ times a day. Hip hike  Stand sideways on a bottom step. Place your feet so that your left / right leg is on the step, and the other foot is hanging off the side. If you need support for balance, hold onto a railing or wall. Keep your knees straight and your abdomen square, meaning your hips are level. Then, lift your left / right hip up  toward the ceiling. Slowly let your leg that's hanging off the step lower towards the floor. Your foot should get closer to the ground. Do not lean or bend your knees during this movement. Repeat _____3_____ times. Complete this exercise _____2_____ times a day. This information is not intended to replace advice given to you by your health care provider. Make sure you discuss any questions you have with your health care provider. Document Revised: 10/17/2022 Document Reviewed: 10/17/2022 [employer] Patient Education  2024 [employer]Iliotibial Band Syndrome  Iliotibial band syndrome, also called ITBS, is a knee injury that often causes knee pain. It can also cause pain in the outer hip and thigh. The IT band is a strip of tissue that runs along the outside of your hip down to your knee. This band moves over the hip bone when you bend and straighten your knee. If you bend and straighten your knee a lot, the band rubs against the bone and can get irritated and inflamed. What are the causes? ITBS happens when the IT band becomes tight. This may happen from activities when you bend and straighten your knees often. When the band is tight, it doesn't glide over the bone as smoothly. The tight band starts to rub against bone. This causes irritation, swelling, and pain. What increases the risk? You're more likely to get ITBS if: You change the incline a lot while running on a treadmill. You run long distances. Your workouts are longer and harder than normal. You run downhill often, or you've recently started running downhill. You often ride your bike long distances. More risk factors include: Starting a new workout routine without warming up your muscles. Having a job where you must bend, squat, or climb a lot. What are the signs or symptoms? Pain along the outside of your knee that may be worse with activities like running or stair climbing. A painful snapping feeling over your knee or hip. Swelling  on the outer side of your knee. A feeling of pain or tightness in your knee or hip. How is this diagnosed? ITBS is diagnosed based on your symptoms, medical history, and a physical exam. A specialist called a physical therapist may do a physical exam. This provider will check your balance, how you move, and the way you walk and run. This helps find if any of your movements add to your injury. You may also have tests to see how strong and flexible you are and how far you can move or stretch parts of your body. This is called range of motion. How is this treated? Treatment may include: Pain medicine, such as ibuprofen . Rest. Limiting exercise and activity. Physical therapy. This is exercise to improve strength and movement. Steroid shots. This is medicine that helps relieve swelling. Surgery. This may be done if other treatments don't work. Follow these instructions at home: Managing pain,  stiffness, and swelling If told, put ice on the injured area. Put ice in a plastic bag. Place a towel between your skin and the bag. Leave the ice on for 20 minutes, 2-3 times a day. If your skin turns bright red, remove the ice right away to prevent skin damage. The risk of damage is higher if you cannot feel pain, heat, or cold. Activity Return to your normal activities as told by your health care provider. Ask your provider what activities are safe for you. Try low-impact workouts, such as swimming. Low-impact workouts put less pressure on your joints. Ask your provider if running is safe for you. Do exercises as told by your provider. General instructions Take over-the-counter and prescription medicines only as told by your provider. Wear shoes that fit well and have good cushioning and arch support. How is this prevented? Warm up and stretch before being active. Cool down and stretch after being active. Give your body time to rest between activities. Talk to a coach, trainer, or physical therapist  to come up with a safe exercise plan. When running on a track, change directions often. Get new shoes when the soles are worn out. Stay active and fit. This includes strength and flexibility exercises. Contact a health care provider if: Your pain does not get better. Your pain gets worse even with treatment. This information is not intended to replace advice given to you by your health care provider. Make sure you discuss any questions you have with your health care provider. Document Revised: 10/17/2022 Document Reviewed: 10/17/2022 Elsevier Patient Education  2024 [employer].

## 2024-03-10 NOTE — Progress Notes (Signed)
 Subjective:    Patient ID: Grant Wilson, male    DOB: 1966-04-20, 58 y.o.   MRN: [id number]  58y/o african [place] male here for evaluation bilateral hip pain right greater than left.  Has been taking tylenol  500mg  2 tabs po prn.  Left hip pain typically resolves after tylenol  but right does not.  Denied injury/new exercise routine.  Has been sitting more at work. Denied swelling/bruising/rash.  Pain starts right posterior and will sometimes radiate down lateral right leg.  Has tried biofreeze and massage also.  Helps some.  Denied weakness, saddle paresthesias or arm/leg weakness/falls/dropping items, loss of bowel/bladder control, fever/chills/n/v/d.  Last took his blood pressure medication last week.      Review of Systems  Constitutional:  Negative for chills and fever.  HENT:  Negative for trouble swallowing and voice change.   Gastrointestinal:  Negative for diarrhea, nausea and vomiting.  Genitourinary:  Negative for difficulty urinating and dysuria.  Musculoskeletal:  Positive for arthralgias, back pain and myalgias. Negative for gait problem, joint swelling, neck pain and neck stiffness.  Skin:  Negative for color change, rash and wound.  Neurological:  Negative for dizziness, tremors, seizures, syncope, facial asymmetry, speech difficulty, weakness, light-headedness, numbness and headaches.  Psychiatric/Behavioral:  Negative for self-injury and sleep disturbance.        Objective:   Physical Exam Vitals reviewed.  Constitutional:      General: He is awake. He is not in acute distress.    Appearance: Normal appearance. He is well-developed, well-groomed and normal weight. He is not ill-appearing, toxic-appearing or diaphoretic.  HENT:     Head: Normocephalic and atraumatic.     Jaw: There is normal jaw occlusion.     Salivary Glands: Right salivary gland is not diffusely enlarged. Left salivary gland is not diffusely enlarged.     Right Ear: Hearing and external ear  normal.     Left Ear: Hearing and external ear normal.     Nose: Nose normal. No congestion or rhinorrhea.     Mouth/Throat:     Lips: Pink. No lesions.     Mouth: Mucous membranes are moist.     Pharynx: Oropharynx is clear.  Eyes:     General: Lids are normal. Vision grossly intact. Gaze aligned appropriately. No scleral icterus.       Right eye: No discharge.        Left eye: No discharge.     Extraocular Movements: Extraocular movements intact.     Conjunctiva/sclera: Conjunctivae normal.     Pupils: Pupils are equal, round, and reactive to light.  Neck:     Trachea: Trachea normal.  Cardiovascular:     Rate and Rhythm: Normal rate and regular rhythm.     Pulses: Normal pulses.  Pulmonary:     Effort: Pulmonary effort is normal. No respiratory distress.     Breath sounds: Normal breath sounds and air entry. No stridor or transmitted upper airway sounds. No wheezing.     Comments: Spoke full sentences without difficulty; no cough observed in exam room Abdominal:     General: Abdomen is flat.     Palpations: Abdomen is soft.  Musculoskeletal:        General: Tenderness present. No swelling, deformity or signs of injury. Normal range of motion.     Right hand: Normal strength. Normal capillary refill.     Left hand: Normal strength. Normal capillary refill.     Cervical back: Normal range of motion and  neck supple. No swelling, edema, deformity, erythema, signs of trauma, lacerations, rigidity, spasms, torticollis, tenderness or crepitus. No pain with movement. Normal range of motion.     Thoracic back: No swelling, edema, deformity, signs of trauma, lacerations, spasms, tenderness or bony tenderness. Normal range of motion.     Lumbar back: Tenderness present. No swelling, edema, deformity, signs of trauma, lacerations, spasms or bony tenderness. Normal range of motion.       Back:     Right hip: Tenderness present. No deformity, lacerations, bony tenderness or crepitus. Normal  range of motion. Normal strength.     Left hip: No deformity, lacerations, tenderness, bony tenderness or crepitus. Normal range of motion. Normal strength.     Right upper leg: Tenderness present. No swelling, edema, deformity, lacerations or bony tenderness.     Left upper leg: No swelling, edema, deformity, lacerations, tenderness or bony tenderness.     Right lower leg: No edema.     Left lower leg: No edema.       Legs:     Comments: AROM bilateral hips equal ab/adduction/flexion and extension; strength 5/5 bilateral extremities; in/out of chair without difficulty; gait sure and steady; IT band right  and soft tissue low back/gluteus mildly TTP no trigger spot identified or spasms palpated; bilateral SI joints not TTP  Lymphadenopathy:     Head:     Right side of head: No submandibular or preauricular adenopathy.     Left side of head: No submandibular or preauricular adenopathy.     Cervical: No cervical adenopathy.     Right cervical: No superficial cervical adenopathy.    Left cervical: No superficial cervical adenopathy.  Skin:    General: Skin is warm and dry.     Capillary Refill: Capillary refill takes less than 2 seconds.     Coloration: Skin is not ashen, cyanotic, jaundiced, mottled, pale or sallow.     Findings: No abrasion, bruising, burn, erythema, signs of injury, laceration, lesion, petechiae, rash or wound.     Comments: Anterior face/neck/hands visually inspected; patient wearing short sleeve tshirt and jeans; no RN in clinic to chaperone did not remove jeans for inspection today  Neurological:     General: No focal deficit present.     Mental Status: He is alert and oriented to person, place, and time. Mental status is at baseline.     GCS: GCS eye subscore is 4. GCS verbal subscore is 5. GCS motor subscore is 6.     Cranial Nerves: No cranial nerve deficit, dysarthria or facial asymmetry.     Sensory: Sensation is intact. No sensory deficit.     Motor: Motor  function is intact. No weakness, tremor, atrophy, abnormal muscle tone or seizure activity.     Coordination: Coordination is intact. Coordination normal.     Gait: Gait is intact. Gait normal.     Comments: In/out of chair without difficulty; gait sure and steady in clinic; bilateral hand grasp equal 5/5  Psychiatric:        Attention and Perception: Attention and perception normal.        Mood and Affect: Mood and affect normal.        Speech: Speech normal.        Behavior: Behavior normal. Behavior is cooperative.        Thought Content: Thought content normal.        Cognition and Memory: Cognition and memory normal.        Judgment: Judgment  normal.           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  Biofreeze topical QID prn pain.  May use ice/heat/epsom salt soak/massage.  Reviewed Home exercises with patient demonstrated and trial x 2 weeks rotate through back/IT band and Hip throughout the day pick 5 exercises to perform from handouts and the next day pick 5 more until all completed then start cycle again.   Medications as directed. Patient may take NSAIDS as needed. Call or return to clinic as needed if these symptoms worsen or fail to improve as anticipated. Trial home exercise program per [employer]. Discussed may be combination arthritis and strain/prolonged sitting.  Denied new chair/desk at work.  Next follow up in 2 weeks if no improvement with HEP.  Discussed with patient to take hydrochlorothiazide  po daily as skipping doses has led to elevated blood pressure again.  Consider dash diet.  Patient verbalized agreement and understanding of treatment plan and had no further questions at this time.  [facility]care handout on knee sprain and rehab exercises given to patient. P2: ROM exercises, Stretching,  and Hand out given

## 2024-04-12 ENCOUNTER — Ambulatory Visit: Admitting: Registered Nurse

## 2024-04-13 ENCOUNTER — Encounter: Payer: Self-pay | Admitting: Emergency Medicine

## 2024-04-13 ENCOUNTER — Other Ambulatory Visit: Payer: Self-pay | Admitting: Emergency Medicine

## 2024-04-13 ENCOUNTER — Ambulatory Visit: Payer: Self-pay | Admitting: Emergency Medicine

## 2024-04-13 ENCOUNTER — Ambulatory Visit: Admitting: Emergency Medicine

## 2024-04-13 VITALS — BP 140/78 | HR 106 | Temp 98.0°F | Ht 66.0 in | Wt 154.0 lb

## 2024-04-13 DIAGNOSIS — I10 Essential (primary) hypertension: Secondary | ICD-10-CM

## 2024-04-13 DIAGNOSIS — K219 Gastro-esophageal reflux disease without esophagitis: Secondary | ICD-10-CM

## 2024-04-13 DIAGNOSIS — R202 Paresthesia of skin: Secondary | ICD-10-CM | POA: Diagnosis not present

## 2024-04-13 DIAGNOSIS — R7303 Prediabetes: Secondary | ICD-10-CM

## 2024-04-13 DIAGNOSIS — B181 Chronic viral hepatitis B without delta-agent: Secondary | ICD-10-CM | POA: Diagnosis not present

## 2024-04-13 DIAGNOSIS — E559 Vitamin D deficiency, unspecified: Secondary | ICD-10-CM

## 2024-04-13 LAB — COMPREHENSIVE METABOLIC PANEL WITH GFR
ALT: 24 U/L (ref 0–53)
AST: 33 U/L (ref 0–37)
Albumin: 4.7 g/dL (ref 3.5–5.2)
Alkaline Phosphatase: 50 U/L (ref 39–117)
BUN: 7 mg/dL (ref 6–23)
CO2: 29 meq/L (ref 19–32)
Calcium: 9.3 mg/dL (ref 8.4–10.5)
Chloride: 95 meq/L — ABNORMAL LOW (ref 96–112)
Creatinine, Ser: 0.91 mg/dL (ref 0.40–1.50)
GFR: 92.96 mL/min (ref >60.00)
Glucose, Bld: 91 mg/dL (ref 70–99)
Potassium: 4 meq/L (ref 3.5–5.1)
Sodium: 133 meq/L — ABNORMAL LOW (ref 135–145)
Total Bilirubin: 0.4 mg/dL (ref 0.2–1.2)
Total Protein: 7.9 g/dL (ref 6.0–8.3)

## 2024-04-13 LAB — LIPID PANEL
Cholesterol: 196 mg/dL (ref 0–200)
HDL: 53.8 mg/dL (ref >39.00)
LDL Cholesterol: 128 mg/dL — ABNORMAL HIGH (ref 0–99)
NonHDL: 142.12
Total CHOL/HDL Ratio: 4
Triglycerides: 72 mg/dL (ref 0.0–149.0)
VLDL: 14.4 mg/dL (ref 0.0–40.0)

## 2024-04-13 LAB — CBC WITH DIFFERENTIAL/PLATELET
Basophils Absolute: 0 K/uL (ref 0.0–0.1)
Basophils Relative: 0.5 % (ref 0.0–3.0)
Eosinophils Absolute: 0 K/uL (ref 0.0–0.7)
Eosinophils Relative: 0.7 % (ref 0.0–5.0)
HCT: 45.5 % (ref 39.0–52.0)
Hemoglobin: 15.1 g/dL (ref 13.0–17.0)
Lymphocytes Relative: 32.1 % (ref 12.0–46.0)
Lymphs Abs: 1 K/uL (ref 0.7–4.0)
MCHC: 33.2 g/dL (ref 30.0–36.0)
MCV: 94.6 fl (ref 78.0–100.0)
Monocytes Absolute: 0.4 K/uL (ref 0.1–1.0)
Monocytes Relative: 11.4 % (ref 3.0–12.0)
Neutro Abs: 1.8 K/uL (ref 1.4–7.7)
Neutrophils Relative %: 55.3 % (ref 43.0–77.0)
Platelets: 212 K/uL (ref 150.0–400.0)
RBC: 4.81 Mil/uL (ref 4.22–5.81)
RDW: 14.5 % (ref 11.5–15.5)
WBC: 3.2 K/uL — ABNORMAL LOW (ref 4.0–10.5)

## 2024-04-13 LAB — HEMOGLOBIN A1C: Hgb A1c MFr Bld: 6.3 % (ref 4.6–6.5)

## 2024-04-13 LAB — TSH: TSH: 0.93 u[IU]/mL (ref 0.35–5.50)

## 2024-04-13 LAB — VITAMIN D 25 HYDROXY (VIT D DEFICIENCY, FRACTURES): VITD: 28.58 ng/mL — ABNORMAL LOW (ref 30.00–100.00)

## 2024-04-13 LAB — VITAMIN B12: Vitamin B-12: 199 pg/mL — ABNORMAL LOW (ref 211–911)

## 2024-04-13 MED ORDER — AMLODIPINE BESYLATE 5 MG PO TABS
5.0000 mg | ORAL_TABLET | Freq: Every day | ORAL | 3 refills | Status: AC
Start: 2024-04-13 — End: ?

## 2024-04-13 NOTE — Assessment & Plan Note (Signed)
Diet and nutrition discussed.  Advised to decrease amount of daily carbohydrate intake. ?Hemoglobin A1c done today. ?

## 2024-04-13 NOTE — Assessment & Plan Note (Signed)
 Clinically stable.  No red flag signs or symptoms Chronic problem. 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  Referral placed today.

## 2024-04-13 NOTE — Assessment & Plan Note (Signed)
 Stable. Takes tenofovir 300 mg daily and sees ID doctor on a regular basis.  Has been on this medication for at least 20 years.  Doubt this is contributing to symptoms today.

## 2024-04-13 NOTE — Assessment & Plan Note (Signed)
 BP Readings from Last 3 Encounters:  04/13/24 (!) 140/78  03/10/24 (!) 151/79  02/02/24 (!) 161/80  Normal blood pressure readings at home Continue hydrochlorothiazide  25 mg daily Will check electrolytes today. Electrolyte imbalance is in the differential diagnosis of paresthesia

## 2024-04-13 NOTE — Progress Notes (Signed)
 Grant Wilson 58 y.o.   Chief Complaint  Patient presents with   feet issues     Patient says he has a burning feeling at the bottom of his feet that usually happens at night. He says it has been going on for a couple of weeks, that feeling does come and goes. He also mentions having a pins and needles feeling during the day. He states no swelling     HISTORY OF PRESENT ILLNESS: This is a 58 y.o. male complaining of burning of both feet on and off for the past couple months Burning feeling sometimes associated with pins-and-needles during the day Denies trauma. No other associated symptoms. No other complaints or medical concerns today.  HPI   Prior to Admission medications   Medication Sig Start Date End Date Taking? Authorizing Provider  hydrochlorothiazide  (HYDRODIURIL ) 25 MG tablet Take 1 tablet (25 mg total) by mouth daily. 02/04/24  Yes Cheryln Palma, PA-C  omeprazole  (PRILOSEC) 20 MG capsule Take 1 capsule (20 mg total) by mouth daily as needed (heartburn). 02/02/24 04/13/24 Yes Betancourt, Ellouise LABOR, NP  tenofovir (VIREAD) 300 MG tablet Take 300 mg by mouth daily.   Yes [provider]    Allergies  Allergen Reactions   Losartan  Other (See Comments)    Visual changes blurred moderate resolved after stopping losartan     Patient Active Problem List   Diagnosis Date Noted   Diverticulosis of colon without hemorrhage 11/03/2023   Internal hemorrhoids 01/16/2022   Hepatitis B carrier (HCC) 10/16/2021   H/O adenomatous polyp of colon 10/16/2021   Primary hypertension 10/16/2021   Vitamin D  deficiency 08/24/2020   Positive ANA (antinuclear antibody) 08/24/2020   Prediabetes 10/24/2017   Gastroesophageal reflux disease without esophagitis 11/08/2016   DDD (degenerative disc disease), lumbar 01/11/2016   High cholesterol 05/12/2012   Seasonal allergies 05/12/2012   Chronic hepatitis B (HCC) 09/22/2011    Past Medical History:  Diagnosis Date   Allergy    GERD  (gastroesophageal reflux disease)    Hepatitis B    Hyperlipidemia    Prediabetes     Past Surgical History:  Procedure Laterality Date   NO PAST SURGERIES      Social History   Socioeconomic History   Marital status: Single    Spouse name: n/a   Number of children: 0   Years of education: associates   Highest education level: Not on file  Occupational History   Occupation: IT department    Comment: [employer]  Tobacco Use   Smoking status: Never   Smokeless tobacco: Never  Vaping Use   Vaping status: Never Used  Substance and Sexual Activity   Alcohol use: No    Alcohol/week: 0.0 standard drinks of alcohol   Drug use: No   Sexual activity: Yes    Partners: Female    Birth control/protection: Condom  Other Topics Concern   Not on file  Social History Narrative   From [place]. Came to the [place]  in 1997.   Lives alone.   No children   Social Drivers of Corporate investment banker Strain: Low Risk  (10/21/2023)   Overall Financial Resource Strain (CARDIA)    Difficulty of Paying Living Expenses: Not hard at all  Food Insecurity: No Food Insecurity (04/12/2024)   Hunger Vital Sign    Worried About Running Out of Food in the Last Year: Never true    Ran Out of Food in the Last Year: Never true  Transportation Needs: Not  on file  Physical Activity: Sufficiently Active (04/12/2024)   Exercise Vital Sign    Days of Exercise per Week: 5 days    Minutes of Exercise per Session: 30 min  Stress: No Stress Concern Present (04/12/2024)   [employer] of Occupational Health - Occupational Stress Questionnaire    Feeling of Stress: Only a little  Social Connections: Moderately Integrated (04/12/2024)   Social Connection and Isolation Panel    Frequency of Communication with Friends and Family: Three times a week    Frequency of Social Gatherings with Friends and Family: Twice a week    Attends Religious Services: More than 4 times per year    Active Member of [employer] or  Organizations: Yes    Attends Banker Meetings: 1 to 4 times per year    Marital Status: Divorced  Catering manager Violence: Not on file    No family history on file.   Review of Systems  Constitutional: Negative.  Negative for chills and fever.  HENT: Negative.  Negative for congestion and sore throat.   Respiratory: Negative.  Negative for cough and shortness of breath.   Cardiovascular: Negative.  Negative for chest pain and palpitations.  Gastrointestinal:  Negative for abdominal pain, diarrhea, nausea and vomiting.  Genitourinary: Negative.  Negative for dysuria and hematuria.  Skin: Negative.  Negative for rash.  Neurological: Negative.  Negative for dizziness and headaches.  All other systems reviewed and are negative.   Vitals:   04/13/24 1324  BP: (!) 140/78  Pulse: (!) 106  Temp: 98 F (36.7 C)  SpO2: 98%    Physical Exam Vitals reviewed.  Constitutional:      Appearance: Normal appearance.  HENT:     Head: Normocephalic.     Mouth/Throat:     Mouth: Mucous membranes are moist.     Pharynx: Oropharynx is clear.  Eyes:     Extraocular Movements: Extraocular movements intact.     Pupils: Pupils are equal, round, and reactive to light.  Cardiovascular:     Rate and Rhythm: Normal rate and regular rhythm.     Pulses: Normal pulses.     Heart sounds: Normal heart sounds.  Pulmonary:     Effort: Pulmonary effort is normal.     Breath sounds: Normal breath sounds.  Abdominal:     Palpations: Abdomen is soft.     Tenderness: There is no abdominal tenderness.  Musculoskeletal:     Cervical back: No tenderness.     Right lower leg: No edema.     Left lower leg: No edema.     Comments: Feet: Warm to touch.  No swelling.  No erythema or ecchymosis.  Excellent distal peripheral pulses.  Excellent capillary refill.  Intact distal sensation.  No ulcers or skin breakdowns.  Lymphadenopathy:     Cervical: No cervical adenopathy.  Skin:    General:  Skin is warm and dry.     Capillary Refill: Capillary refill takes less than 2 seconds.  Neurological:     General: No focal deficit present.     Mental Status: He is alert and oriented to person, place, and time.  Psychiatric:        Mood and Affect: Mood normal.        Behavior: Behavior normal.      ASSESSMENT & PLAN: Problem List Items Addressed This Visit       Cardiovascular and Mediastinum   Primary hypertension   BP Readings from Last 3 Encounters:  04/13/24 (!) 140/78  03/10/24 (!) 151/79  02/02/24 (!) 161/80  Normal blood pressure readings at home Continue hydrochlorothiazide  25 mg daily Will check electrolytes today. Electrolyte imbalance is in the differential diagnosis of paresthesia         Digestive   Chronic hepatitis B (HCC)   Stable. Takes tenofovir 300 mg daily and sees ID doctor on a regular basis.  Has been on this medication for at least 20 years.  Doubt this is contributing to symptoms today.      Gastroesophageal reflux disease without esophagitis   Stable and well-controlled.        Other   Prediabetes   Diet and nutrition discussed. Advised to decrease amount of daily carbohydrate intake.  Hemoglobin A1c done today.      Paresthesia of both feet - Primary   Clinically stable.  No red flag signs or symptoms Chronic problem. 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  Referral placed today.      Relevant Orders   Comprehensive metabolic panel with GFR   CBC with Differential/Platelet   Vitamin B12   VITAMIN D  25 Hydroxy (Vit-D Deficiency, Fractures)   TSH   Hemoglobin A1c   Lipid panel   Ambulatory referral to Podiatry   Vitamin D  deficiency   Vitamin D  levels done today.      Patient Instructions  Paresthesia Paresthesia is a burning or prickling feeling. This feeling can happen in any part of the body. It often happens in the  hands, arms, legs, or feet. Usually, it is not painful. In most cases, the feeling goes away in a short time and is not a sign of a serious problem. If you have paresthesia that lasts a long time, you need to see your doctor. Follow these instructions at home: Nutrition Eat a healthy diet. This includes: Eating foods that are high in fiber. These include beans, whole grains, and fresh fruits and vegetables. Limiting foods that are high in fat and sugar. These include fried or sweet foods.  Alcohol use  Do not drink alcohol if: Your doctor tells you not to drink. You are pregnant, may be pregnant, or are planning to become pregnant. If you drink alcohol: Limit how much you have to: 0-1 drink a day for women. 0-2 drinks a day for men. Know how much alcohol is in your drink. In the [place] one drink equals one 12 oz bottle of beer (355 mL), one 5 oz glass of wine (148 mL), or one 1 oz glass of hard liquor (44 mL). General instructions Take over-the-counter and prescription medicines only as told by your doctor. Do not smoke or use any products that contain nicotine or tobacco. If you need help quitting, ask your doctor. If you have diabetes, work with your doctor to make sure your blood sugar stays in a healthy range. If your feet feel numb: Check for redness, warmth, and swelling every day. Wear padded socks and comfortable shoes. These help protect your feet. Keep all follow-up visits. Contact a doctor if: You have paresthesia that gets worse or does not go away. You lose feeling (have numbness) after an injury. Your burning or prickling feeling gets worse when you walk. You have pain or cramps. You feel dizzy or you faint. You have a rash. Get help right away if: You feel weak or have new weakness in an arm or leg. You have trouble walking or moving. You  have problems speaking, understanding, or seeing. You feel confused. You cannot control when you pee (urinate) or poop (have a  bowel movement). These symptoms may be an emergency. Get help right away. Call 911. Do not wait to see if the symptoms will go away. Do not drive yourself to the hospital. Summary Paresthesia is a burning or prickling feeling. It often happens in the hands, arms, legs, or feet. In most cases, the feeling goes away in a short time and is not a sign of a serious problem. If you have paresthesia that lasts a long time, you need to be seen by your doctor. This information is not intended to replace advice given to you by your health care provider. Make sure you discuss any questions you have with your health care provider. Document Revised: 04/15/2021 Document Reviewed: 04/15/2021 Elsevier Patient Education  2024 Elsevier Inc.    Emil Schaumann, MD [facility]

## 2024-04-13 NOTE — Assessment & Plan Note (Signed)
Vitamin D levels done today. 

## 2024-04-13 NOTE — Patient Instructions (Signed)
 Paresthesia Paresthesia is a burning or prickling feeling. This feeling can happen in any part of the body. It often happens in the hands, arms, legs, or feet. Usually, it is not painful. In most cases, the feeling goes away in a short time and is not a sign of a serious problem. If you have paresthesia that lasts a long time, you need to see your doctor. Follow these instructions at home: Nutrition Eat a healthy diet. This includes: Eating foods that are high in fiber. These include beans, whole grains, and fresh fruits and vegetables. Limiting foods that are high in fat and sugar. These include fried or sweet foods.  Alcohol use  Do not drink alcohol if: Your doctor tells you not to drink. You are pregnant, may be pregnant, or are planning to become pregnant. If you drink alcohol: Limit how much you have to: 0-1 drink a day for women. 0-2 drinks a day for men. Know how much alcohol is in your drink. In the [place] one drink equals one 12 oz bottle of beer (355 mL), one 5 oz glass of wine (148 mL), or one 1 oz glass of hard liquor (44 mL). General instructions Take over-the-counter and prescription medicines only as told by your doctor. Do not smoke or use any products that contain nicotine or tobacco. If you need help quitting, ask your doctor. If you have diabetes, work with your doctor to make sure your blood sugar stays in a healthy range. If your feet feel numb: Check for redness, warmth, and swelling every day. Wear padded socks and comfortable shoes. These help protect your feet. Keep all follow-up visits. Contact a doctor if: You have paresthesia that gets worse or does not go away. You lose feeling (have numbness) after an injury. Your burning or prickling feeling gets worse when you walk. You have pain or cramps. You feel dizzy or you faint. You have a rash. Get help right away if: You feel weak or have new weakness in an arm or leg. You have trouble walking or  moving. You have problems speaking, understanding, or seeing. You feel confused. You cannot control when you pee (urinate) or poop (have a bowel movement). These symptoms may be an emergency. Get help right away. Call 911. Do not wait to see if the symptoms will go away. Do not drive yourself to the hospital. Summary Paresthesia is a burning or prickling feeling. It often happens in the hands, arms, legs, or feet. In most cases, the feeling goes away in a short time and is not a sign of a serious problem. If you have paresthesia that lasts a long time, you need to be seen by your doctor. This information is not intended to replace advice given to you by your health care provider. Make sure you discuss any questions you have with your health care provider. Document Revised: 04/15/2021 Document Reviewed: 04/15/2021 Elsevier Patient Education  2024 [employer].

## 2024-04-13 NOTE — Assessment & Plan Note (Signed)
 Stable and well controlled.

## 2024-04-19 ENCOUNTER — Encounter: Payer: Self-pay | Admitting: Registered Nurse

## 2024-04-19 ENCOUNTER — Telehealth: Payer: Self-pay | Admitting: Registered Nurse

## 2024-04-19 DIAGNOSIS — I10 Essential (primary) hypertension: Secondary | ICD-10-CM

## 2024-04-19 DIAGNOSIS — R7989 Other specified abnormal findings of blood chemistry: Secondary | ICD-10-CM

## 2024-04-19 DIAGNOSIS — R202 Paresthesia of skin: Secondary | ICD-10-CM

## 2024-04-19 NOTE — Telephone Encounter (Signed)
 Patient to clinic stated saw PCM and sodium and chloride levels low and could be causing burning in feet along with notified vitamin D  low again and wondering if safe to take leftover vitamin D  50,000 unit capsules he had at home.  Expiration date Mar 2025 patient has stored in home with air conditioning in original container from pharmacy.  Discussed with patient that they may have lost some of their efficacy but still safe to take.  Recommended to take one a week with meal that contains fat for best absorption  Capsules intact in medication bottle.  Discussed free [employer] dietitian with patient again may schedule appt at [email or web address]   Latest Reference Range & Units 04/13/24 14:01  Comprehensive metabolic panel with GFR  Rpt !  Sodium 135 - 145 mEq/L 133 (L)  Potassium 3.5 - 5.1 mEq/L 4.0  Chloride 96 - 112 mEq/L 95 (L)  CO2 19 - 32 mEq/L 29  Glucose 70 - 99 mg/dL 91  BUN 6 - 23 mg/dL 7  Creatinine 9.59 - 8.49 mg/dL 9.08  Calcium  8.4 - 10.5 mg/dL 9.3  Alkaline Phosphatase 39 - 117 U/L 50  Albumin 3.5 - 5.2 g/dL 4.7  AST 0 - 37 U/L 33  ALT 0 - 53 U/L 24  Total Protein 6.0 - 8.3 g/dL 7.9  Total Bilirubin 0.2 - 1.2 mg/dL 0.4  GFR >39.99 mL/min 92.96  Total CHOL/HDL Ratio  4  Cholesterol 0 - 200 mg/dL 803  HDL Cholesterol >60.99 mg/dL 46.19  LDL (calc) 0 - 99 mg/dL 871 (H)  NonHDL  857.87  Triglycerides 0.0 - 149.0 mg/dL 27.9  VLDL 0.0 - 59.9 mg/dL 85.5  VITD 69.99 - 899.99 ng/mL 28.58 (L)  Vitamin B12 211 - 911 pg/mL 199 (L)  WBC 4.0 - 10.5 K/uL 3.2 (L)  RBC 4.22 - 5.81 Mil/uL 4.81  Hemoglobin 13.0 - 17.0 g/dL 84.8  HCT 60.9 - 47.9 % 45.5  MCV 78.0 - 100.0 fl 94.6  MCHC 30.0 - 36.0 g/dL 66.7  RDW 88.4 - 84.4 % 14.5  Platelets 150.0 - 400.0 K/uL 212.0  Neutrophils 43.0 - 77.0 % 55.3  Lymphocytes 12.0 - 46.0 % 32.1  Monocytes Relative 3.0 - 12.0 % 11.4  Eosinophil 0.0 - 5.0 % 0.7  Basophil 0.0 - 3.0 % 0.5  NEUT# 1.4 - 7.7 K/uL 1.8  Lymphs Abs 0.7 - 4.0 K/uL 1.0  Monocyte #  0.1 - 1.0 K/uL 0.4  Eosinophils Absolute 0.0 - 0.7 K/uL 0.0  Basophils Absolute 0.0 - 0.1 K/uL 0.0  Hemoglobin A1C 4.6 - 6.5 % 6.3  TSH 0.35 - 5.50 uIU/mL 0.93  !: Data is abnormal (L): Data is abnormally low (H): Data is abnormally high Rpt: View report in Results Review for more information  Reviewed PCM note and reinforced instructions today and will continue on future visits regarding new amlodipine  Rx and B12 and D supplements along with dietary recommendations.  Patient typically eats vegetarian per previous conversations with him.  Noted hydrochlorothiazide  stopped and amlodipine  5mg  started.  Patient previously on amlodipine  5mg  2023  Medication is available at his worksite for free.  Previously did not tolerate losartan  visual changes and was discontinued.  Blood work shows the following: 1.  Elevated LDL cholesterol 2.  Low sodium and chloride levels which could be accounting for your burning sensation on your feet This is secondary to urinary losses due to diuretic you are taking, hydrochlorothiazide .  Recommend to stop hydrochlorothiazide  and start amlodipine  for blood pressure control instead.  I will send a new prescription to your pharmacy of record today.  Stop hydrochlorothiazide  and start amlodipine . 3.  Vitamin B12 and vitamin D  levels are low which may also contribute to burning sensation in your feet.  Start daily supplementation with over-the-counter vitamin B-12 and vitamin D  4.  Hemoglobin A1c is 6.3 which is in prediabetic range.  This also contributes to burning sensation in your feet Pay more attention to your nutrition and decrease amount of daily carbohydrate intake and daily calories and increase amount of plant based protein in your diet. If you have any questions, please contact the office or send us  a message. Take care and be safe. Dr. Sagardia

## 2024-04-21 ENCOUNTER — Telehealth: Payer: Self-pay | Admitting: Registered Nurse

## 2024-04-21 DIAGNOSIS — I10 Essential (primary) hypertension: Secondary | ICD-10-CM

## 2024-04-21 MED ORDER — AMLODIPINE BESYLATE 5 MG PO TABS: 5.0000 mg | ORAL_TABLET | Freq: Every day | ORAL | 1 refills | Status: AC

## 2024-04-21 NOTE — Telephone Encounter (Signed)
 Patient with new Rx from [facility] amlodipine  5mg  po daily dispensed 90 tabs to patient today from [facility].  Stopped hydrochlorothiazide  25mg  due to low sodium level per PCM.  He bought B12 2500mcg OTC discussed to take every other day not daily at this time.  And retest level per PCM instructions.  Patient to see RN next Thursday for BP recheck after taking amlodipine  for 7 days.  Discussed mechanism of action with patient.  Patient tolerated in the past without difficulty.  Reminded patient losartan  was the medicine he did not tolerate well and had visual side effects.  Patient stated feeling well today denied concerns.  He had not filled amlodipine  at [facility] pharmacy after [facility] appt as concerned it was medication that caused problems for him.  Patient agreed with plan of care and had no further questions at this time.

## 2024-04-28 ENCOUNTER — Ambulatory Visit: Payer: Self-pay | Admitting: Registered Nurse

## 2024-04-28 ENCOUNTER — Encounter: Payer: Self-pay | Admitting: Registered Nurse

## 2024-04-28 DIAGNOSIS — R7989 Other specified abnormal findings of blood chemistry: Secondary | ICD-10-CM

## 2024-04-28 DIAGNOSIS — R7303 Prediabetes: Secondary | ICD-10-CM

## 2024-04-28 DIAGNOSIS — E538 Deficiency of other specified B group vitamins: Secondary | ICD-10-CM

## 2024-04-28 LAB — GLUCOSE, POCT (MANUAL RESULT ENTRY): POC Glucose: 107 mg/dL — AB (ref 70–99)

## 2024-04-28 NOTE — Patient Instructions (Signed)
 Prediabetes: Eating Plan Prediabetes is when your levels of blood sugar, also called glucose, are higher than normal. This can put you at risk for getting type 2 diabetes. When you have prediabetes, making healthy changes can help keep you from getting diabetes. This includes changes in your diet. Work with your health care provider or an expert in healthy eating called a dietitian. They can help you create a healthy eating plan. This plan can help you: Control your blood sugar levels. Improve your cholesterol levels. Manage your blood pressure. What are tips for following this plan? Reading food labels Read food labels to check the amount of fat and sugar in prepackaged foods. Avoid foods that have: Saturated fats. Trans fats. Added sugars. Check food labels for the amount of salt (sodium). Avoid foods that have more than 300 milligrams (mg) of salt per serving. Limit your salt intake to less than 2,300 mg each day. Shopping Avoid buying pre-made and processed foods. Avoid buying drinks with added sugar. Cooking Cook with olive oil. Do not use: Butter. Lard. Ghee. Bake, broil, grill, steam, or boil foods. Avoid frying. Meal planning  Work with your dietitian to create an eating plan that's right for you. This may include tracking how many calories you take in each day. Use a food diary, notebook, or mobile app to track what you eat at each meal. Consider following a Mediterranean diet. This includes: Eating many servings of fresh fruits and vegetables each day. Eating fish at least twice a week. Eating one serving each day of whole grains, beans, nuts, and seeds. Using olive oil instead of other fats. Limiting alcohol. Limiting red meat. Using nonfat or low-fat dairy products. Consider following a plant-based diet. This means eating mostly: Vegetables and fruit. Grains. Beans. Nuts and seeds. If you have high blood pressure, you may need to limit your salt intake or follow a  diet called the DASH eating plan. The DASH eating plan can help lower high blood pressure. Lifestyle Set weight loss goals with help from your health care team. Losing 7% of your body weight is a good goal for most people with prediabetes. Exercise for at least 30 minutes, 5 or more days a week. For support, think about joining a support group or talking with a mental health counselor. Take medicines only as told. What foods are recommended? Fruits Berries. Bananas. Apples. Oranges. Grapes. Papaya. Mango. Pomegranate. Kiwi. Grapefruit. Cherries. Vegetables Lettuce. Spinach. Peas. Beets. Cauliflower. Cabbage. Broccoli. Carrots. Tomatoes. Squash. Eggplant. Herbs. Peppers. Onions. Cucumbers. Brussels sprouts. Grains Whole grains, such as whole-wheat or whole-grain breads or pasta. Unsweetened oatmeal. Bulgur. Barley. Quinoa. Brown rice. Corn or whole-wheat flour tortillas or taco shells. Meats and other proteins Seafood. Poultry without skin. Lean cuts of pork and beef. Tofu. Eggs. Nuts. Beans. Dairy Low-fat or fat-free dairy products, such as yogurt, cottage cheese, and cheese. Beverages Water. Tea. Coffee. Sugar-free or diet soda. Seltzer water. Low-fat or nonfat milk. Milk alternatives, such as soy or almond milk. Fats and oils Olive oil. Canola oil. Sunflower oil. Grapeseed oil. Avocado. Walnuts. Sweets and desserts Sugar-free or low-fat pudding. Sugar-free or low-fat ice cream and other frozen treats. Seasonings and condiments Herbs. Salt-free spices. Mustard. Relish. Low-salt, low-sugar ketchup. Low-salt, low-sugar barbecue sauce. Low-fat or fat-free mayonnaise. The items listed above may not be all the foods and drinks you can have. Talk with a dietitian to learn more. What foods are not recommended? Fruits Fruits canned with syrup. Vegetables Canned vegetables. Frozen vegetables with butter or  cream sauce. Grains Refined white flour and flour products, such as bread, pasta, snack  foods, and cereals. Meats and other proteins Fatty cuts of meat. Poultry with skin. Breaded or fried meat. Processed meats. Dairy Full-fat yogurt, cheese, or milk. Beverages Sweetened drinks, such as iced tea and soda. Fats and oils Butter. Lard. Ghee. Sweets and desserts Baked goods, such as cake, cupcakes, pastries, cookies, and cheesecake. Seasonings and condiments Spice mixes with added salt. Ketchup. Barbecue sauce. Mayonnaise. The items listed above may not be all the foods and drinks you should avoid. Talk with a dietitian to learn more. Where to find more information American Diabetes Association: diabetes.org/food-nutrition This information is not intended to replace advice given to you by your health care provider. Make sure you discuss any questions you have with your health care provider. Document Revised: 03/08/2023 Document Reviewed: 03/08/2023 Elsevier Patient Education  2024 [employer] Inc.Vitamin D  Deficiency: What to Know Vitamin D  deficiency is when your body doesn't have enough vitamin D . Vitamin D  is important because: It helps the body to maintain calcium  and phosphorus levels. It helps to keep your bones healthy. Not getting enough vitamin D  can make your bones soft. It reduces irritation or inflammation in the body. It helps the body's defense system (immune system) work better. What are the causes? Not eating enough foods that have vitamin D  in them. Not getting enough sun. Having diseases that make it hard for your body to take in vitamin D . Having had part of your stomach or part of your small intestine taken out. What increases the risk? Being an older adult. Not spending much time outdoors. Living in a long-term care center. Having dark skin. Taking certain medicines, such as steroids or seizure medicines. Being overweight or very overweight (obese). Having long-term (chronic) kidney or liver disease. What are the signs or symptoms? Pain in the  bones. Pain in the muscles. Not being able to walk normally. Bones that break easily. Joint pain. How is this diagnosed? This condition may be diagnosed with blood tests. Imaging tests such as X-rays may also be done to look for weakness in the bone. How is this treated? Treatment may include taking supplements. You may be told to take vitamin D  or calcium .  Your health care provider will tell you what dose is best for you. Follow these instructions at home: Eating and drinking Eat foods that contain vitamin D , such as: Dairy products, cereals, or juices that have vitamin D  added to them. Check the label on the package to see if vitamin D  was added to your food. Fish, such as salmon or trout. Eggs. The vitamin D  is in the yolk. Mushrooms that were treated with UV light. Beef liver. The items listed above may not be all the foods and drinks that have vitamin D . Talk with a dietitian to learn more. General instructions Take your medicines only as told. Take supplements only as told. Get regular, safe exposure to natural sunlight. Do not use a tanning bed. Maintain a healthy weight. Lose weight if needed. Contact a health care provider if: Your symptoms do not go away. You throw up or feel like you may throw up. You have trouble pooping (constipation), or you poop less than usual. This information is not intended to replace advice given to you by your health care provider. Make sure you discuss any questions you have with your health care provider. Document Revised: 12/21/2023 Document Reviewed: 10/23/2023 Elsevier Patient Education  2025 Elsevier Inc.Vitamin B12 Deficiency  Vitamin B12 deficiency means that your body does not have enough vitamin B12. The body needs this important vitamin: To make red blood cells. To make genes (DNA). To help the nerves work. If you do not have enough vitamin B12 in your body, you can have health problems, such as not having enough red blood cells in  the blood (anemia). What are the causes? Not eating enough foods that contain vitamin B12. Not being able to take in (absorb) vitamin B12 from the food that you eat. Certain diseases. A condition in which the body does not make enough of a certain protein. This results in your body not taking in enough vitamin B12. Having a surgery in which part of the stomach or small intestine is taken out. Taking medicines that make it hard for the body to take in vitamin B12. These include: Heartburn medicines. Some medicines that are used to treat diabetes. What increases the risk? Being an older adult. Eating a vegetarian or vegan diet that does not include any foods that come from animals. Not eating enough foods that contain vitamin B12 while you are pregnant. Taking certain medicines. Having alcoholism. What are the signs or symptoms? In some cases, there are no symptoms. If the condition leads to too few blood cells or nerve damage, symptoms can occur, such as: Feeling weak or tired. Not being hungry. Losing feeling (numbness) or tingling in your hands and feet. Redness and burning of the tongue. Feeling sad (depressed). Confusion or memory problems. Trouble walking. If anemia is very bad, symptoms can include: Being short of breath. Being dizzy. Having a very fast heartbeat. How is this treated? Changing the way you eat and drink, such as: Eating more foods that contain vitamin B12. Drinking little or no alcohol. Getting vitamin B12 shots. Taking vitamin B12 supplements by mouth (orally). Your doctor will tell you the dose that is best for you. Follow these instructions at home: Eating and drinking  Eat foods that come from animals and have a lot of vitamin B12 in them. These include: Meats and poultry. This includes beef, pork, chicken, [place], and organ meats, such as liver. Seafood, such as clams, rainbow trout, salmon, tuna, and haddock. Eggs. Dairy foods such as milk, yogurt,  and cheese. Eat breakfast cereals that have vitamin B12 added to them (are fortified). Check the label. The items listed above may not be a complete list of foods and beverages you can eat and drink. Contact a dietitian for more information. Alcohol use Do not drink alcohol if: Your doctor tells you not to drink. You are pregnant, may be pregnant, or are planning to become pregnant. If you drink alcohol: Limit how much you have to: 0-1 drink a day for women. 0-2 drinks a day for men. Know how much alcohol is in your drink. In the [place] one drink equals one 12 oz bottle of beer (355 mL), one 5 oz glass of wine (148 mL), or one 1 oz glass of hard liquor (44 mL). General instructions Get any vitamin B12 shots if told by your doctor. Take supplements only as told by your doctor. Follow the directions. Keep all follow-up visits. Contact a doctor if: Your symptoms come back. Your symptoms get worse or do not get better with treatment. Get help right away if: You have trouble breathing. You have a very fast heartbeat. You have chest pain. You get dizzy. You faint. These symptoms may be an emergency. Get help right away. Call 911. Do not  wait to see if the symptoms will go away. Do not drive yourself to the hospital. Summary Vitamin B12 deficiency means that your body is not getting enough of the vitamin. In some cases, there are no symptoms of this condition. Treatment may include making a change in the way you eat and drink, getting shots, or taking supplements. Eat foods that have vitamin B12 in them. This information is not intended to replace advice given to you by your health care provider. Make sure you discuss any questions you have with your health care provider. Document Revised: 03/29/2021 Document Reviewed: 03/29/2021 Elsevier Patient Education  2024 [employer].

## 2024-04-28 NOTE — Progress Notes (Signed)
 Established Patient Office Visit  Subjective   Patient ID: Grant Wilson, male    DOB: 1966-04-06  Age: 58 y.o. MRN: [id number]  Chief Complaint  Patient presents with   Labs Only    Recheck sodium and chloride, B12 and vitamin D     58y/o [place]rican [place] male established patient requesting follow up labs for low sodium/chloride and recheck after stopping hydrochlorothiazide .  Also wants B12 and Vitamin D  rechecked.  Feeling well denied concerns.  Has drank water this am.      Review of Systems  Constitutional:  Negative for chills, diaphoresis, fever and malaise/fatigue.  Cardiovascular:  Negative for chest pain and leg swelling.  Musculoskeletal:  Negative for myalgias.  Skin:  Negative for itching and rash.      Objective:     There were no vitals taken for this visit.   Physical Exam Constitutional:      General: He is not in acute distress.    Appearance: Normal appearance. He is well-developed. He is not ill-appearing, toxic-appearing or diaphoretic.  HENT:     Head: Normocephalic and atraumatic.     Right Ear: External ear normal.     Left Ear: External ear normal.     Mouth/Throat:     Pharynx: Oropharynx is clear.  Eyes:     General: Lids are normal.     Conjunctiva/sclera: Conjunctivae normal.     Pupils: Pupils are equal, round, and reactive to light.  Neck:     Trachea: Trachea normal.  Cardiovascular:     Rate and Rhythm: Normal rate and regular rhythm.     Pulses: Normal pulses.  Pulmonary:     Effort: Pulmonary effort is normal. No respiratory distress.     Breath sounds: Normal breath sounds. No stridor. No wheezing, rhonchi or rales.  Abdominal:     Palpations: Abdomen is soft.  Musculoskeletal:        General: Normal range of motion.     Cervical back: Normal range of motion and neck supple. No rigidity.     Right lower leg: No edema.     Left lower leg: No edema.  Lymphadenopathy:     Cervical: No cervical adenopathy.  Skin:     General: Skin is warm and dry.     Capillary Refill: Capillary refill takes less than 2 seconds.     Coloration: Skin is not jaundiced or pale.     Findings: No bruising, erythema, lesion or rash.  Neurological:     General: No focal deficit present.     Mental Status: He is alert and oriented to person, place, and time. Mental status is at baseline.     Cranial Nerves: No cranial nerve deficit.     Sensory: No sensory deficit.     Motor: No weakness.     Coordination: Coordination normal.     Gait: Gait normal.     Deep Tendon Reflexes: Reflexes normal.  Psychiatric:        Mood and Affect: Mood normal.        Speech: Speech normal.        Behavior: Behavior normal.        Thought Content: Thought content normal.        Judgment: Judgment normal.    Venipuncture x 3 attempt right and left AC without adequate supply specimen for BMET guaze and cohesive bandage applied each site clean dry and intact on discharge from clinic discussed to remove in 5-10 minutes.  Patient agreed with plan of care and had no further questions at this time.  Results for orders placed or performed in visit on 04/28/24  POCT Glucose (CBG)-Manual entry (CPT [phone])  Result Value Ref Range   POC Glucose 107 (A) 70 - 99 mg/dl      The [id number] ASCVD risk score (Arnett DK, et al., 2019) is: 9.2%  Latest Reference Range & Units 04/13/24 14:01  Comprehensive metabolic panel with GFR  Rpt !  Sodium 135 - 145 mEq/L 133 (L)  Potassium 3.5 - 5.1 mEq/L 4.0  Chloride 96 - 112 mEq/L 95 (L)  CO2 19 - 32 mEq/L 29  Glucose 70 - 99 mg/dL 91  BUN 6 - 23 mg/dL 7  Creatinine 9.59 - 8.49 mg/dL 9.08  Calcium  8.4 - 10.5 mg/dL 9.3  Alkaline Phosphatase 39 - 117 U/L 50  Albumin 3.5 - 5.2 g/dL 4.7  AST 0 - 37 U/L 33  ALT 0 - 53 U/L 24  Total Protein 6.0 - 8.3 g/dL 7.9  Total Bilirubin 0.2 - 1.2 mg/dL 0.4  GFR >39.99 mL/min 92.96  Total CHOL/HDL Ratio  4  Cholesterol 0 - 200 mg/dL 803  HDL Cholesterol >60.99 mg/dL  46.19  LDL (calc) 0 - 99 mg/dL 871 (H)  NonHDL  857.87  Triglycerides 0.0 - 149.0 mg/dL 27.9  VLDL 0.0 - 59.9 mg/dL 85.5  VITD 69.99 - 899.99 ng/mL 28.58 (L)  Vitamin B12 211 - 911 pg/mL 199 (L)  WBC 4.0 - 10.5 K/uL 3.2 (L)  RBC 4.22 - 5.81 Mil/uL 4.81  Hemoglobin 13.0 - 17.0 g/dL 84.8  HCT 60.9 - 47.9 % 45.5  MCV 78.0 - 100.0 fl 94.6  MCHC 30.0 - 36.0 g/dL 66.7  RDW 88.4 - 84.4 % 14.5  Platelets 150.0 - 400.0 K/uL 212.0  Neutrophils 43.0 - 77.0 % 55.3  Lymphocytes 12.0 - 46.0 % 32.1  Monocytes Relative 3.0 - 12.0 % 11.4  Eosinophil 0.0 - 5.0 % 0.7  Basophil 0.0 - 3.0 % 0.5  NEUT# 1.4 - 7.7 K/uL 1.8  Lymphs Abs 0.7 - 4.0 K/uL 1.0  Monocyte # 0.1 - 1.0 K/uL 0.4  Eosinophils Absolute 0.0 - 0.7 K/uL 0.0  Basophils Absolute 0.0 - 0.1 K/uL 0.0  Hemoglobin A1C 4.6 - 6.5 % 6.3  TSH 0.35 - 5.50 uIU/mL 0.93  !: Data is abnormal (L): Data is abnormally low (H): Data is abnormally high Rpt: View report in Results Review for more information   Assessment & Plan:   Problem List Items Addressed This Visit       Other   Prediabetes   Relevant Orders   POCT Glucose (CBG)-Manual entry (CPT [phone]) (Completed)   Other Visit Diagnoses       Low serum sodium    -  Primary   Relevant Orders   Basic Metabolic Panel (BMET)     Low serum vitamin D        Relevant Orders   VITAMIN D  25 Hydroxy (Vit-D Deficiency, Fractures)     Low serum vitamin B12       Relevant Orders   Vitamin B12     Patient may see RN Apolinar next week for BMET and BP recheck as started on amlodipine  by PCM and Hydrochlorothiazide  discontinued at last PCM appt.  Patient started OTC B12 and D supplements.  Discussed next recheck in 3-6 months nonfasting.  [facility] handouts vitamin D  defiency, vitamin B12 deficiency and prediabetes diet  Patient agreed with plan of care and  had no further questions at this time.  Return in about 3 months (around 07/28/2024), or if symptoms worsen or fail to improve, for lab B12/Vit  D; see RN Apolinar Monday for BMET nonfasting.    Ellouise DELENA Hope, NP

## 2024-05-02 ENCOUNTER — Other Ambulatory Visit: Payer: Self-pay | Admitting: Registered Nurse

## 2024-05-02 ENCOUNTER — Ambulatory Visit

## 2024-05-03 ENCOUNTER — Ambulatory Visit: Payer: Self-pay | Admitting: Registered Nurse

## 2024-05-03 LAB — SODIUM: Sodium: 138 mmol/L (ref 134–144)

## 2024-06-21 ENCOUNTER — Encounter: Payer: Self-pay | Admitting: Registered Nurse

## 2024-06-21 ENCOUNTER — Telehealth: Payer: Self-pay | Admitting: Registered Nurse

## 2024-06-21 DIAGNOSIS — R7989 Other specified abnormal findings of blood chemistry: Secondary | ICD-10-CM

## 2024-06-21 DIAGNOSIS — R7303 Prediabetes: Secondary | ICD-10-CM

## 2024-06-21 DIAGNOSIS — I10 Essential (primary) hypertension: Secondary | ICD-10-CM

## 2024-06-21 DIAGNOSIS — M79632 Pain in left forearm: Secondary | ICD-10-CM

## 2024-06-21 MED ORDER — CHOLECALCIFEROL 1.25 MG (50000 UT) PO CAPS: 50000.0000 [IU] | ORAL_CAPSULE | ORAL | 3 refills | Status: DC

## 2024-06-21 NOTE — Telephone Encounter (Signed)
 Patient here for refill hydrochlorothiazide  stated ran out.  Discussed with patient that PCM had switched him from hydrochlorothiazide  to amlodipine  because of low sodium with daily use.  Patient reported has been having left forearm pain sometimes radiating to wrist the past two weeks.  Ran out of vitamin D  50,000 units and would like refill sent to his pharmacy also.  Take 1 tab weekly with meal #12 RF3 sent to his pharmacy of choice.  Had vitamin D  and B12 levels with PCM earlier this year and repeat levels due in 1 month nonfasting along with BMET since starting amlodipine  5mg  po daily.  Patient stated he has been taking B12 supplement oral also  Patient to schedule lab appt with RN Karene.  Will do labs sooner if worsening paresthesias.  Patient will see RN Karene in 48 hours for BP recheck once restarting amlodipine .  Discussed with patient important to take his blood pressure medication every day.  Patient verbalized understanding information/instructions and had no further questions at this time.

## 2024-06-22 ENCOUNTER — Ambulatory Visit: Payer: Self-pay | Admitting: General Practice

## 2024-06-22 VITALS — BP 136/73 | HR 76 | Resp 16

## 2024-06-22 DIAGNOSIS — I10 Essential (primary) hypertension: Secondary | ICD-10-CM

## 2024-06-22 NOTE — Progress Notes (Signed)
 Employee presents to the clinic requesting vital signs and CBG to be taken. Briefly discussed tingling in feet and arms. States continuing B12 liquid vitamin and has a sweet taste to it. Pt stated taking B/P medication last night. Pt reports inconsistency with taking medications.   VSS; CBG = 112. Discussed stable vital signs, likely due to taking medications last night. RN explained by taking medications daily or when prescribed keeps the body blood levels constant and therapeutic. Medications will work better for him and he is less likely to feel tingling in feet and arms when he stays consistent.  RN questioned future specific goals and explained genetics playing a part in health. Short and long term goals can be better achieved if one stays healthier by taking medications and eating better foods such as low salt meals. Requested employee begin taking medications as prescribed today and return to the clinic on a biweekly basis to measure vital signs with a nurse visit. Will follow up as needed and appropriate.

## 2024-08-30 ENCOUNTER — Telehealth: Payer: Self-pay | Admitting: Registered Nurse

## 2024-08-30 ENCOUNTER — Encounter: Payer: Self-pay | Admitting: Registered Nurse

## 2024-08-30 NOTE — Telephone Encounter (Signed)
 Last week had pain blood vessel anterior right wrist.  Denied trauma/swelling/rash/bruising.  Skin warm dry and pink without ecchymosis/abrasion/rash/swelling today.  Sensation normal.  Negative finkelsteins/pain with palpation bone and soft tissue today.  Full arom abduction/adduction/flexion/extension/rotation/lateral bending bilateral hands/wrists/fingers and strength 5/5 capillary refill less than 2 seconds  Patient to follow up same day if pain occurring for evaluation by RN or NP.  DDx strain, phlebitis  Patient agreed with plan of care and had no further questions at this time.

## 2024-09-01 ENCOUNTER — Ambulatory Visit: Payer: Self-pay | Admitting: Registered Nurse

## 2024-09-01 ENCOUNTER — Encounter: Payer: Self-pay | Admitting: Registered Nurse

## 2024-09-01 VITALS — BP 154/88 | HR 92 | Temp 97.8°F | Resp 18

## 2024-09-01 DIAGNOSIS — R21 Rash and other nonspecific skin eruption: Secondary | ICD-10-CM

## 2024-09-01 DIAGNOSIS — I10 Essential (primary) hypertension: Secondary | ICD-10-CM

## 2024-09-01 DIAGNOSIS — Z0000 Encounter for general adult medical examination without abnormal findings: Secondary | ICD-10-CM

## 2024-09-01 DIAGNOSIS — B351 Tinea unguium: Secondary | ICD-10-CM

## 2024-09-01 DIAGNOSIS — R202 Paresthesia of skin: Secondary | ICD-10-CM

## 2024-09-01 MED ORDER — TERBINAFINE HCL 250 MG PO TABS: 250.0000 mg | ORAL_TABLET | Freq: Every day | ORAL | 2 refills | Status: AC

## 2024-09-01 NOTE — Progress Notes (Signed)
 "  Established Patient Office Visit  Subjective   Patient ID: Grant Wilson, male    DOB: May 13, 1966  Age: 59 y.o. MRN: [id number]  No chief complaint on file.   58y/o african american male established patient here for evaluation bilateral foot pain left greater than right.  My toenail has been discolored for a long time. Took his last vitamin D  dose today.  Has not been taking his amlodipine  daily for blood pressure--when I remember taking typically not daily.  Taking B12 supplement.      Review of Systems  Constitutional:  Negative for chills, diaphoresis, fever and malaise/fatigue.  Musculoskeletal:  Positive for myalgias.  Skin:  Negative for itching and rash.  Neurological:  Positive for tingling. Negative for dizziness, tremors, sensory change and headaches.      Objective:     BP (!) 154/88 (BP Location: Left Arm, Patient Position: Sitting, Cuff Size: Normal) Comment: Pt states  have not taken meds Comment (Cuff Size): Manual cuff  Pulse 92   Temp 97.8 F (36.6 C) (Tympanic)   Resp 18   SpO2 99%    Physical Exam Vitals and nursing note reviewed.  Constitutional:      General: He is awake. He is not in acute distress.    Appearance: Normal appearance. He is well-developed, well-groomed and normal weight. He is not ill-appearing, toxic-appearing or diaphoretic.  HENT:     Head: Normocephalic and atraumatic.     Jaw: There is normal jaw occlusion.     Salivary Glands: Right salivary gland is not diffusely enlarged. Left salivary gland is not diffusely enlarged.     Right Ear: Hearing and external ear normal. No decreased hearing noted. No laceration or drainage.     Left Ear: Hearing and external ear normal. No decreased hearing noted. No laceration or drainage.     Nose: Nose normal. No congestion or rhinorrhea.     Right Nostril: No epistaxis.     Left Nostril: No epistaxis.     Mouth/Throat:     Lips: Pink. No lesions.     Mouth: Mucous membranes are moist.  No oral lesions or angioedema.     Dentition: No gum lesions.     Pharynx: Oropharynx is clear. Uvula midline.  Eyes:     General: Lids are normal. Vision grossly intact. Gaze aligned appropriately. No allergic shiner or scleral icterus.       Right eye: No discharge.        Left eye: No discharge.     Extraocular Movements: Extraocular movements intact.     Conjunctiva/sclera: Conjunctivae normal.     Pupils: Pupils are equal, round, and reactive to light.  Neck:     Trachea: Trachea normal.  Cardiovascular:     Rate and Rhythm: Normal rate and regular rhythm.     Pulses: Normal pulses.          Radial pulses are 2+ on the right side and 2+ on the left side.       Dorsalis pedis pulses are 2+ on the right side and 2+ on the left side.  Pulmonary:     Effort: Pulmonary effort is normal. No respiratory distress.     Breath sounds: Normal breath sounds and air entry. No stridor or transmitted upper airway sounds. No wheezing.     Comments: Spoke full sentences without difficulty; no cough observed in exam room Abdominal:     General: Abdomen is flat.  Musculoskeletal:  General: No swelling, tenderness, deformity or signs of injury. Normal range of motion.     Right hand: Normal strength. Normal capillary refill.     Left hand: Normal strength. Normal capillary refill.     Cervical back: Normal range of motion and neck supple. No swelling, edema, deformity, erythema, signs of trauma, lacerations, rigidity, torticollis or crepitus. No pain with movement. Normal range of motion.     Thoracic back: No swelling, edema, deformity, signs of trauma or lacerations. Normal range of motion.     Right lower leg: No edema.     Left lower leg: No edema.     Right ankle: No swelling, deformity, ecchymosis or lacerations. No tenderness. Normal range of motion.     Left ankle: No swelling, deformity, ecchymosis or lacerations. No tenderness. Normal range of motion.     Right foot: Normal range of  motion and normal capillary refill. No swelling, deformity, bunion, Charcot foot, foot drop, prominent metatarsal heads, laceration, tenderness, bony tenderness or crepitus.     Left foot: Normal range of motion and normal capillary refill. No swelling, deformity, bunion, Charcot foot, foot drop, prominent metatarsal heads, laceration, tenderness, bony tenderness or crepitus.  Feet:     Right foot:     Skin integrity: Callus and dry skin present. No ulcer, blister, skin breakdown, erythema, warmth or fissure.     Toenail Condition: Right toenails are abnormally thick. Fungal disease present.    Left foot:     Skin integrity: Callus and dry skin present. No ulcer, blister, skin breakdown, erythema, warmth or fissure.     Toenail Condition: Left toenails are abnormally thick. Fungal disease present.    Comments: Callous bilateral feet heels and overlying MTP joints; dry skin and toenails thick 1st toe friable and right great toenail with linear hyperpigmented lines Lymphadenopathy:     Head:     Right side of head: No submandibular or preauricular adenopathy.     Left side of head: No submandibular or preauricular adenopathy.     Cervical: No cervical adenopathy.     Right cervical: No superficial cervical adenopathy.    Left cervical: No superficial cervical adenopathy.  Skin:    General: Skin is warm and dry.     Capillary Refill: Capillary refill takes less than 2 seconds.     Coloration: Skin is not ashen, cyanotic, jaundiced, mottled, pale or sallow.     Findings: Rash present. No abrasion, abscess, acne, bruising, burn, ecchymosis, erythema, signs of injury, laceration, lesion, petechiae or wound. Rash is macular. Rash is not crusting, nodular, papular, purpuric, pustular, scaling, urticarial or vesicular.     Comments: Plantar bilateral feet nummular hyperpigmented scattered lesions bilaterally dry no erythema  Neurological:     General: No focal deficit present.     Mental Status: He  is alert and oriented to person, place, and time. Mental status is at baseline.     GCS: GCS eye subscore is 4. GCS verbal subscore is 5. GCS motor subscore is 6.     Cranial Nerves: Cranial nerves 2-12 are intact. No cranial nerve deficit, dysarthria or facial asymmetry.     Sensory: Sensation is intact.     Motor: Motor function is intact. No weakness, tremor, abnormal muscle tone or seizure activity.     Coordination: Coordination is intact. Coordination normal.     Gait: Gait is intact. Gait normal.     Comments: In/out of chair without difficulty; gait sure and steady in clinic;  bilateral hand grasp equal 5/5  Psychiatric:        Attention and Perception: Attention and perception normal.        Mood and Affect: Mood and affect normal.        Speech: Speech normal.        Behavior: Behavior normal. Behavior is cooperative.        Thought Content: Thought content normal.        Cognition and Memory: Cognition and memory normal.        Judgment: Judgment normal.      No results found for any visits on 09/01/24.     Latest Reference Range & Units 04/13/24 14:01  Sodium 135 - 145 mEq/L 133 (L)  Potassium 3.5 - 5.1 mEq/L 4.0  Chloride 96 - 112 mEq/L 95 (L)  CO2 19 - 32 mEq/L 29  Glucose 70 - 99 mg/dL 91  BUN 6 - 23 mg/dL 7  Creatinine 9.59 - 8.49 mg/dL 9.08  Calcium  8.4 - 10.5 mg/dL 9.3  Alkaline Phosphatase 39 - 117 U/L 50  Albumin 3.5 - 5.2 g/dL 4.7  AST 0 - 37 U/L 33  ALT 0 - 53 U/L 24  Total Protein 6.0 - 8.3 g/dL 7.9  Total Bilirubin 0.2 - 1.2 mg/dL 0.4  GFR >39.99 mL/min 92.96  Total CHOL/HDL Ratio  4  Cholesterol 0 - 200 mg/dL 803  HDL Cholesterol >60.99 mg/dL 46.19  LDL (calc) 0 - 99 mg/dL 871 (H)  NonHDL  857.87  Triglycerides 0.0 - 149.0 mg/dL 27.9  VLDL 0.0 - 59.9 mg/dL 85.5  VITD 69.99 - 899.99 ng/mL 28.58 (L)  Vitamin B12 211 - 911 pg/mL 199 (L)  WBC 4.0 - 10.5 K/uL 3.2 (L)  RBC 4.22 - 5.81 Mil/uL 4.81  Hemoglobin 13.0 - 17.0 g/dL 84.8  HCT 60.9 - 47.9  % 45.5  MCV 78.0 - 100.0 fl 94.6  MCHC 30.0 - 36.0 g/dL 66.7  RDW 88.4 - 84.4 % 14.5  Platelets 150.0 - 400.0 K/uL 212.0  Neutrophils 43.0 - 77.0 % 55.3  Lymphocytes 12.0 - 46.0 % 32.1  Monocytes Relative 3.0 - 12.0 % 11.4  Eosinophil 0.0 - 5.0 % 0.7  Basophil 0.0 - 3.0 % 0.5  NEUT# 1.4 - 7.7 K/uL 1.8  Lymphs Abs 0.7 - 4.0 K/uL 1.0  Monocyte # 0.1 - 1.0 K/uL 0.4  Eosinophils Absolute 0.0 - 0.7 K/uL 0.0  Basophils Absolute 0.0 - 0.1 K/uL 0.0  Hemoglobin A1C 4.6 - 6.5 % 6.3  (L): Data is abnormally low (H): Data is abnormally high  The 10-year ASCVD risk score (Arnett DK, et al., 2019) is: 10.8%    Assessment & Plan:   Problem List Items Addressed This Visit       Other   Paresthesia of both feet - Primary   Relevant Orders   VITAMIN D  25 Hydroxy (Vit-D Deficiency, Fractures)   Vitamin B12   Other Visit Diagnoses       Onychomycosis       Relevant Medications   terbinafine  (LAMISIL ) 250 MG tablet     Preventative health care       Relevant Orders   Executive Panel   Hemoglobin A1c     Rash of foot         Take amlodipine  every day 5mg  po daily consider dash diet recheck BP with RN Karene next week Goal BP less than 120/70  Discussed paresthesias may be related to tinea pedis, low vitamin B6/B12/vitamin D   levels and/or prediabetes last Hgba1c 6.20 Apr 2024  Patient was due labs in December but did not schedule due to workload.  Scheduled for next Monday 0830 fasting executive panel, Hgba1c, Vitamin D  and B12 may drink black coffee and water prior to lab draw  Discussed good pulses today in feet.    Thickened toenails with subungal debris c/w onychomycosis   Start lamisil  250mg  po daily for 12 weeks toenails per up to date electronic rx sent to his pharmacy of choice #[facility] today  Discussed with patient monthly blood tests required to monitor for liver inflammation while taking medication. Will requires months of therapy. Discussed antifungal powder in footwear. Rotating  footwear and placing footwear in sun to dry out between use. Apply emollient to feet daily.  Exitcare handouts on onychomycosis and tinea pedis  Patient to avoid alcohol consumption while taking Lamisil . Patient verbalized understanding and agreed with plan of care and had no further questions at this time.   Patient due [facility] labs March 2026 will draw early since Vitamin D  and B12 labs being drawn   BP elevated today has not been taking amlodipine  5mg  daily  After vitamin D  level known has been taking weekly 50,000 units po cholecalciferol  since 21 Jun 2024 if normal then start OTC 2000 units daily with meal  History of low B12 and when taking supplement has risen too high and symptomatic  patient typically eats vegetarian.  Recheck level as paresthesias feet this week.  Patient agreed with plan of care and had no further questions at this time. Return if symptoms worsen or fail to improve, for fasting lab Monday 1/19 0830 RN Karene water or black coffee only after midnight Sunday.    Ellouise DELENA Hope, NP  "

## 2024-09-01 NOTE — Telephone Encounter (Signed)
 Patient seen in clinic 09/01/24 labs scheduled for 09/05/24 with RN Karene at [facility]

## 2024-09-01 NOTE — Patient Instructions (Addendum)
 Paresthesia Paresthesia is an abnormal burning or prickling sensation. It is usually felt in the hands, arms, legs, or feet. However, it may occur in any part of the body. Usually, paresthesia is not painful. It may feel like: Tingling or numbness. Buzzing. Itching. Paresthesia may occur without any clear cause, or it may be caused by: Breathing too quickly (hyperventilation). Pressure on a nerve. An underlying medical condition. Side effects of a medicine. Nutritional deficiencies. Exposure to toxic chemicals. Most people experience temporary (transient) paresthesia at some time in their lives. For some people, it may be long-lasting (chronic) because of an underlying medical condition. If you have paresthesia that lasts a long time, you need to be evaluated by your health care provider. Follow these instructions at home: Nutrition Eat a healthy diet. This includes: Eating foods that are high in fiber, such as beans, whole grains, and fresh fruits and vegetables. Limiting foods that are high in fat and processed sugars, such as fried or sweet foods.  Alcohol use  Avoid or limit alcohol. Too much alcohol can cause a vitamin B deficiency, and vitamin B is needed for healthy nerves. Do not drink alcohol if: Your health care provider tells you not to drink. You are pregnant, may be pregnant, or are planning to become pregnant. If you drink alcohol: Limit how much you have to: 0-1 drink a day for women. 0-2 drinks a day for men. Know how much alcohol is in your drink. In the [place] one drink equals one 12 oz bottle of beer (355 mL), one 5 oz glass of wine (148 mL), or one 1 oz glass of hard liquor (44 mL). General instructions Take over-the-counter and prescription medicines only as told by your health care provider. Do not use any products that contain nicotine or tobacco. These products include cigarettes, chewing tobacco, and vaping devices, such as e-cigarettes. If you need help  quitting, ask your health care provider. If you have diabetes, work closely with your health care provider to keep your blood sugar under control. If you have numbness in your feet: Check every day for signs of injury or infection. Watch for redness, warmth, and swelling. Wear padded socks and comfortable shoes. These help protect your feet. Keep all follow-up visits. This is important. Contact a health care provider if you: Have paresthesia that gets worse or does not go away. Have numbness after an injury. Have a burning or prickling feeling that gets worse when you walk. Have pain, cramps, or dizziness, or you faint. Develop a rash. Get help right away if you: Feel muscle weakness. Develop new weakness in an arm or leg. Have trouble walking or moving. Have problems with speech, understanding, or vision. Feel confused. Cannot control your bladder or bowel movements. These symptoms may be an emergency. Get help right away. Call 911. Do not wait to see if the symptoms will go away. Do not drive yourself to the hospital. Summary Paresthesia is an abnormal burning or prickling sensation that is usually felt in the hands, arms, legs, or feet. It may also occur in other parts of the body. Paresthesia may occur without any clear cause, or it may be caused by breathing too quickly (hyperventilation), pressure on a nerve, an underlying medical condition, side effects of a medicine, nutritional deficiencies, or exposure to toxic chemicals. If you have paresthesia that lasts a long time, you need to be evaluated by your health care provider. This information is not intended to replace advice given to you by  your health care provider. Make sure you discuss any questions you have with your health care provider. Document Revised: 04/15/2021 Document Reviewed: 04/15/2021 Elsevier Patient Education  2024 [employer]Athlete's Foot Athlete's foot (tinea pedis) is a fungal infection of the skin on  your feet. It often occurs on the skin that is between or underneath the toes. It can also occur on the soles of your feet. The infection can spread from person to person (is contagious). It can also spread when a person's bare feet come in contact with the fungus on shower floors or on items such as shoes. What are the causes? This condition is caused by a fungus that grows in warm, moist places. You can get athlete's foot by sharing shoes, shower stalls, towels, and wet floors with someone who is infected. Not washing your feet or changing your socks often enough can also lead to athlete's foot. What increases the risk? This condition is more likely to develop in: Men. People who have a weak body defense system (immune system). People who have diabetes. People who use public showers, such as at a gym. People who wear heavy-duty shoes, such as youth worker. Seasons with warm, humid weather. What are the signs or symptoms? Symptoms of this condition include: Itchy areas between your toes or on the soles of your feet. White, flaky, or scaly areas between your toes or on the soles of your feet. Very itchy small blisters between your toes or on the soles of your feet. Small cuts in your skin. These cuts can become infected. Thick or discolored toenails. How is this diagnosed? This condition may be diagnosed with a physical exam and a review of your medical history. Your health care provider may also take a skin or toenail sample to examine under a microscope. How is this treated? This condition is treated with antifungal medicines. These may be applied as powders, ointments, or creams. In severe cases, an oral antifungal medicine may be given. Follow these instructions at home: Medicines Apply or take over-the-counter and prescription medicines only as told by your health care provider. Apply your antifungal medicine as told by your health care provider. Do not stop using the  antifungal even if your condition improves. Foot care Do not scratch your feet. Keep your feet dry: Wear cotton or wool socks. Change your socks every day or if they become wet. Wear shoes that allow air to flow, such as sandals or canvas tennis shoes. Wash and dry your feet, including the area between your toes. Also, wash and dry your feet: Every day or as told by your health care provider. After exercising. General instructions Do not let others use towels, shoes, nail clippers, or other personal items that touch your feet. Protect your feet by wearing sandals in wet areas, such as locker rooms and shared showers. Keep all follow-up visits. This is important. If you have diabetes, keep your blood sugar under control. Contact a health care provider if: You have a fever. You have swelling, soreness, warmth, or redness in your foot. Your feet are not getting better with treatment. Your symptoms get worse. You have new symptoms. You have severe pain. Summary Athlete's foot (tinea pedis) is a fungal infection of the skin on your feet. It often occurs on skin that is between or underneath the toes. This condition is caused by a fungus that grows in warm, moist places. Symptoms include white, flaky, or scaly areas between your toes or on the soles  of your feet. This condition is treated with antifungal medicines. Keep your feet clean. Always dry them thoroughly. This information is not intended to replace advice given to you by your health care provider. Make sure you discuss any questions you have with your health care provider. Document Revised: 11/25/2020 Document Reviewed: 11/25/2020 Elsevier Patient Education  2024 [employer]Fungal Nail Infection A fungal nail infection is a common infection of the toenails or fingernails. This condition affects toenails more often than fingernails. It often affects the great, or big, toes. More than one nail may be infected. The condition can be  passed from person to person (is contagious). What are the causes? This condition is caused by a fungus, such as yeast or molds. Several types of fungi can cause the infection. These fungi are common in moist and warm areas. If your hands or feet come into contact with the fungus, it may get into a crack in your fingernail or toenail or in the surrounding skin, and cause an infection. What increases the risk? The following factors may make you more likely to develop this condition: Being of older age. Having certain medical conditions, such as: Athlete's foot. Diabetes. Poor circulation. A weak body defense system (immune system). Walking barefoot in areas where the fungus thrives, such as showers or locker rooms. Wearing shoes and socks that cause your feet to sweat. Having a nail injury or a recent nail surgery. What are the signs or symptoms? Symptoms of this condition include: A pale spot on the nail. Thickening of the nail. A nail that becomes yellow, brown, or white. A brittle or ragged nail edge. A nail that has lifted away from the nail bed. How is this diagnosed? This condition is diagnosed with a physical exam. Your health care provider may take a scraping or clipping from your nail to test for the fungus. How is this treated? Treatment is not needed for mild infections. If you have significant nail changes, treatment may include: Antifungal medicines taken by mouth (orally). You may need to take the medicine for several weeks or several months, and you may not see the results for a long time. These medicines can cause side effects. Ask your health care provider what problems to watch for. Antifungal nail polish or nail cream. These may be used along with oral antifungal medicines. Laser treatment of the nail. Surgery to remove the nail. This may be needed for the most severe infections. It can take a long time, usually up to a year, for the infection to go away. The infection  may also come back. Follow these instructions at home: Medicines Take or apply over-the-counter and prescription medicines only as told by your health care provider. Ask your health care provider about using over-the-counter mentholated ointment on your nails. Nail care Trim your nails often. Wash and dry your hands and feet every day. Keep your feet dry. To do this: Wear absorbent socks, and change your socks frequently. Wear shoes that allow air to circulate, such as sandals or canvas tennis shoes. Throw out old shoes. If you go to a nail salon, make sure you choose one that uses clean instruments. Use antifungal foot powder on your feet and in your shoes. General instructions Do not share personal items, such as towels or nail clippers. Do not walk barefoot in shower rooms or locker rooms. Wear rubber gloves if you are working with your hands in wet areas. Keep all follow-up visits. This is important. Contact a health care  provider if: You have redness, pain, or pus near the toenail or fingernail. Your infection is not getting better, or it is getting worse after several months. You have more circulation problems near the toenail or fingernail. You have brown or black discoloration of the nail that spreads to the surrounding skin. Summary A fungal nail infection is a common infection of the toenails or fingernails. Treatment is not needed for mild infections. If you have significant nail changes, treatment may include taking medicine orally and applying medicine to your nails. It can take a long time, usually up to a year, for the infection to go away. The infection may also come back. Take or apply over-the-counter and prescription medicines only as told by your health care provider. This information is not intended to replace advice given to you by your health care provider. Make sure you discuss any questions you have with your health care provider. Document Revised: 11/05/2020  Document Reviewed: 11/05/2020 Elsevier Patient Education  2024 [employer]  Managing Your Hypertension Hypertension, also called high blood pressure, is when the force of the blood pressing against the walls of the arteries is too strong. Arteries are blood vessels that carry blood from your heart throughout your body. Hypertension forces the heart to work harder to pump blood and may cause the arteries to become narrow or stiff. Understanding blood pressure readings A blood pressure reading includes a higher number over a lower number: The first, or top, number is called the systolic pressure. It is a measure of the pressure in your arteries as your heart beats. The second, or bottom number, is called the diastolic pressure. It is a measure of the pressure in your arteries as the heart relaxes. For most people, a normal blood pressure is below 120/80. Your personal target blood pressure may vary depending on your medical conditions, your age, and other factors. Blood pressure is classified into four stages. Based on your blood pressure reading, your health care provider may use the following stages to determine what type of treatment you need, if any. Systolic pressure and diastolic pressure are measured in a unit called millimeters of mercury (mmHg). Normal Systolic pressure: below 120. Diastolic pressure: below 80. Elevated Systolic pressure: 120-129. Diastolic pressure: below 80. Hypertension stage 1 Systolic pressure: 130-139. Diastolic pressure: 80-89. Hypertension stage 2 Systolic pressure: 140 or above. Diastolic pressure: 90 or above. How can this condition affect me? Managing your hypertension is very important. Over time, hypertension can damage the arteries and decrease blood flow to parts of the body, including the brain, heart, and kidneys. Having untreated or uncontrolled hypertension can lead to: A heart attack. A stroke. A weakened blood vessel (aneurysm). Heart  failure. Kidney damage. Eye damage. Memory and concentration problems. Vascular dementia. What actions can I take to manage this condition? Hypertension can be managed by making lifestyle changes and possibly by taking medicines. Your health care provider will help you make a plan to bring your blood pressure within a normal range. You may be referred for counseling on a healthy diet and physical activity. Nutrition  Eat a diet that is high in fiber and potassium, and low in salt (sodium), added sugar, and fat. An example eating plan is called the DASH diet. DASH stands for Dietary Approaches to Stop Hypertension. To eat this way: Eat plenty of fresh fruits and vegetables. Try to fill one-half of your plate at each meal with fruits and vegetables. Eat whole grains, such as whole-wheat pasta, brown rice,  or whole-grain bread. Fill about one-fourth of your plate with whole grains. Eat low-fat dairy products. Avoid fatty cuts of meat, processed or cured meats, and poultry with skin. Fill about one-fourth of your plate with lean proteins such as fish, chicken without skin, beans, eggs, and tofu. Avoid pre-made and processed foods. These tend to be higher in sodium, added sugar, and fat. Reduce your daily sodium intake. Many people with hypertension should eat less than 1,500 mg of sodium a day. Lifestyle  Work with your health care provider to maintain a healthy body weight or to lose weight. Ask what an ideal weight is for you. Get at least 30 minutes of exercise that causes your heart to beat faster (aerobic exercise) most days of the week. Activities may include walking, swimming, or biking. Include exercise to strengthen your muscles (resistance exercise), such as weight lifting, as part of your weekly exercise routine. Try to do these types of exercises for 30 minutes at least 3 days a week. Do not use any products that contain nicotine or tobacco. These products include cigarettes, chewing  tobacco, and vaping devices, such as e-cigarettes. If you need help quitting, ask your health care provider. Control any long-term (chronic) conditions you have, such as high cholesterol or diabetes. Identify your sources of stress and find ways to manage stress. This may include meditation, deep breathing, or making time for fun activities. Alcohol use Do not drink alcohol if: Your health care provider tells you not to drink. You are pregnant, may be pregnant, or are planning to become pregnant. If you drink alcohol: Limit how much you have to: 0-1 drink a day for women. 0-2 drinks a day for men. Know how much alcohol is in your drink. In the [place] one drink equals one 12 oz bottle of beer (355 mL), one 5 oz glass of wine (148 mL), or one 1 oz glass of hard liquor (44 mL). Medicines Your health care provider may prescribe medicine if lifestyle changes are not enough to get your blood pressure under control and if: Your systolic blood pressure is 130 or higher. Your diastolic blood pressure is 80 or higher. Take medicines only as told by your health care provider. Follow the directions carefully. Blood pressure medicines must be taken as told by your health care provider. The medicine does not work as well when you skip doses. Skipping doses also puts you at risk for problems. Monitoring Before you monitor your blood pressure: Do not smoke, drink caffeinated beverages, or exercise within 30 minutes before taking a measurement. Use the bathroom and empty your bladder (urinate). Sit quietly for at least 5 minutes before taking measurements. Monitor your blood pressure at home as told by your health care provider. To do this: Sit with your back straight and supported. Place your feet flat on the floor. Do not cross your legs. Support your arm on a flat surface, such as a table. Make sure your upper arm is at heart level. Each time you measure, take two or three readings one minute apart and  record the results. You may also need to have your blood pressure checked regularly by your health care provider. General information Talk with your health care provider about your diet, exercise habits, and other lifestyle factors that may be contributing to hypertension. Review all the medicines you take with your health care provider because there may be side effects or interactions. Keep all follow-up visits. Your health care provider can help you create and  adjust your plan for managing your high blood pressure. Where to find more information National Heart, Lung, and Blood Institute: popsteam.is American Heart Association: www.heart.org Contact a health care provider if: You think you are having a reaction to medicines you have taken. You have repeated (recurrent) headaches. You feel dizzy. You have swelling in your ankles. You have trouble with your vision. Get help right away if: You develop a severe headache or confusion. You have unusual weakness or numbness, or you feel faint. You have severe pain in your chest or abdomen. You vomit repeatedly. You have trouble breathing. These symptoms may be an emergency. Get help right away. Call 911. Do not wait to see if the symptoms will go away. Do not drive yourself to the hospital. Summary Hypertension is when the force of blood pumping through your arteries is too strong. If this condition is not controlled, it may put you at risk for serious complications. Your personal target blood pressure may vary depending on your medical conditions, your age, and other factors. For most people, a normal blood pressure is less than 120/80. Hypertension is managed by lifestyle changes, medicines, or both. Lifestyle changes to help manage hypertension include losing weight, eating a healthy, low-sodium diet, exercising more, stopping smoking, and limiting alcohol. This information is not intended to replace advice given to you by your health  care provider. Make sure you discuss any questions you have with your health care provider. Document Revised: 04/18/2021 Document Reviewed: 04/18/2021 Elsevier Patient Education  2024 [employer].

## 2024-09-05 ENCOUNTER — Other Ambulatory Visit: Payer: Self-pay | Admitting: General Practice

## 2024-09-06 ENCOUNTER — Other Ambulatory Visit: Payer: Self-pay | Admitting: Registered Nurse

## 2024-09-06 ENCOUNTER — Other Ambulatory Visit: Payer: Self-pay | Admitting: General Practice

## 2024-09-06 DIAGNOSIS — R202 Paresthesia of skin: Secondary | ICD-10-CM

## 2024-09-06 DIAGNOSIS — Z0000 Encounter for general adult medical examination without abnormal findings: Secondary | ICD-10-CM

## 2024-09-06 NOTE — Progress Notes (Signed)
 Lab work drawn, sending to [facility] this afternoon. Will Cc results to Dr. Purcell.   09/07/2024  Addendum: RN shared lab work results with Glean this afternoon. He reports having tingling in his feet, fatigue and not sleeping well some nights. He questioned if this is due to his very high vitamin B12. RN will discuss with the clinic NP and follow up with Glean.

## 2024-09-07 ENCOUNTER — Ambulatory Visit: Payer: Self-pay | Admitting: Registered Nurse

## 2024-09-07 ENCOUNTER — Other Ambulatory Visit: Payer: Self-pay | Admitting: Registered Nurse

## 2024-09-07 DIAGNOSIS — E559 Vitamin D deficiency, unspecified: Secondary | ICD-10-CM

## 2024-09-07 DIAGNOSIS — E538 Deficiency of other specified B group vitamins: Secondary | ICD-10-CM

## 2024-09-07 DIAGNOSIS — R7989 Other specified abnormal findings of blood chemistry: Secondary | ICD-10-CM

## 2024-09-07 DIAGNOSIS — R7303 Prediabetes: Secondary | ICD-10-CM

## 2024-09-07 DIAGNOSIS — Z8639 Personal history of other endocrine, nutritional and metabolic disease: Secondary | ICD-10-CM

## 2024-09-07 DIAGNOSIS — I10 Essential (primary) hypertension: Secondary | ICD-10-CM

## 2024-09-07 LAB — CMP12+LP+TP+TSH+6AC+PSA+CBC…
ALT: 21 IU/L (ref 0–44)
AST: 32 IU/L (ref 0–40)
Albumin: 4.5 g/dL (ref 3.8–4.9)
Alkaline Phosphatase: 52 IU/L (ref 47–123)
BUN/Creatinine Ratio: 5 — ABNORMAL LOW (ref 9–20)
BUN: 5 mg/dL — ABNORMAL LOW (ref 6–24)
Basophils Absolute: 0 x10E3/uL (ref 0.0–0.2)
Basos: 1 %
Bilirubin Total: 0.4 mg/dL (ref 0.0–1.2)
Calcium: 9.7 mg/dL (ref 8.7–10.2)
Chloride: 99 mmol/L (ref 96–106)
Chol/HDL Ratio: 4 ratio (ref 0.0–5.0)
Cholesterol, Total: 208 mg/dL — ABNORMAL HIGH (ref 100–199)
Creatinine, Ser: 1.03 mg/dL (ref 0.76–1.27)
EOS (ABSOLUTE): 0.1 x10E3/uL (ref 0.0–0.4)
Eos: 1 %
Estimated CHD Risk: 0.7 times avg. (ref 0.0–1.0)
Free Thyroxine Index: 2 (ref 1.2–4.9)
GGT: 20 IU/L (ref 0–65)
Globulin, Total: 2.6 g/dL (ref 1.5–4.5)
Glucose: 96 mg/dL (ref 70–99)
HDL: 52 mg/dL
Hematocrit: 45.1 % (ref 37.5–51.0)
Hemoglobin: 14.9 g/dL (ref 13.0–17.7)
Immature Grans (Abs): 0 x10E3/uL (ref 0.0–0.1)
Immature Granulocytes: 0 %
Iron: 74 ug/dL (ref 38–169)
LDH: 176 IU/L (ref 121–224)
LDL Chol Calc (NIH): 145 mg/dL — ABNORMAL HIGH (ref 0–99)
Lymphocytes Absolute: 1.8 x10E3/uL (ref 0.7–3.1)
Lymphs: 50 %
MCH: 30.4 pg (ref 26.6–33.0)
MCHC: 33 g/dL (ref 31.5–35.7)
MCV: 92 fL (ref 79–97)
Monocytes Absolute: 0.5 x10E3/uL (ref 0.1–0.9)
Monocytes: 13 %
Neutrophils Absolute: 1.3 x10E3/uL — ABNORMAL LOW (ref 1.4–7.0)
Neutrophils: 35 %
Phosphorus: 3.9 mg/dL (ref 2.8–4.1)
Platelets: 228 x10E3/uL (ref 150–450)
Potassium: 4.1 mmol/L (ref 3.5–5.2)
Prostate Specific Ag, Serum: 1.7 ng/mL (ref 0.0–4.0)
RBC: 4.9 x10E6/uL (ref 4.14–5.80)
RDW: 13.2 % (ref 11.6–15.4)
Sodium: 136 mmol/L (ref 134–144)
T3 Uptake Ratio: 26 % (ref 24–39)
T4, Total: 7.7 ug/dL (ref 4.5–12.0)
TSH: 1.24 u[IU]/mL (ref 0.450–4.500)
Total Protein: 7.1 g/dL (ref 6.0–8.5)
Triglycerides: 61 mg/dL (ref 0–149)
Uric Acid: 5.1 mg/dL (ref 3.8–8.4)
VLDL Cholesterol Cal: 11 mg/dL (ref 5–40)
WBC: 3.7 x10E3/uL (ref 3.4–10.8)
eGFR: 84 mL/min/1.73

## 2024-09-07 LAB — VITAMIN D 25 HYDROXY (VIT D DEFICIENCY, FRACTURES): Vit D, 25-Hydroxy: 59.9 ng/mL (ref 30.0–100.0)

## 2024-09-07 LAB — B12 AND FOLATE PANEL
Folate: 11.5 ng/mL
Vitamin B-12: >2000 pg/mL — ABNORMAL HIGH (ref 232–1245)

## 2024-09-07 LAB — HGB A1C W/O EAG: Hgb A1c MFr Bld: 5.9 % — ABNORMAL HIGH (ref 4.8–5.6)

## 2024-09-07 MED ORDER — VITAMIN D3 50 MCG (2000 UT) PO CAPS: 2000.0000 [IU] | ORAL_CAPSULE | Freq: Every day | ORAL | 3 refills | Status: AC

## 2024-09-07 MED ORDER — VITAMIN B-12 1000 MCG PO TABS: 1000.0000 ug | ORAL_TABLET | ORAL | 0 refills | Status: AC

## 2024-09-07 NOTE — Progress Notes (Signed)
 My chart message sent to patient Grant Wilson, Your 3 month blood sugar, total and LDL cholesterol were elevated along with vitamin B12.  Absolute neutrophils were slightly low.  Hgba1c met requirements for 2026  less than 7.  LDL was greater than 130 but ratio total to HDL less than 4 met requirements for insurance discount starting 18 May 2025  HR team notified.  Please see RN Karene to recheck blood pressure after you had consistently taken for a week your amlodipine .  If your blood pressure continues above 130/80 we recommend follow up with PCM.  Consider increasing intake of potassium rich foods to help lower blood pressure and dash diet also.  [employer] handouts on managing hypertension, dash diet, cholesterol elevated, high fiber diet,  b12 deficiency, prediabetes diet and prediabetes sent to my chart. Notify RN Karene if you want printed copy of lab results or results sent electronically to another provider.  Follow up with PCM annually recommended. I recommend 15 minutes sunlight on skin daily and cholecalciferol  2,000 units by mouth daily with meal  I recommend exercise 150 minutes per week; dietary fiber 30 grams per day men per up to date; eat whole grains/fruits/vegetables; keep added sugars to less than 150 calories/9 teaspoons for men per American Heart Association; spot blood sugar, electrolytes, iron, kidneyliver/thyroid function and vitamin D  normal.  No anemia or infection noted on complete blood count    Repeat fasting executive panel in 1 year.  Repeat Hgba1c in 6 months.   Repeat B12 level in 3 months after dose change to cyanobalamin 1000mcg once a week by mouth.  Start this new dose in 2 weeks.  Rx sent to your pharmacy for vitamin D  and B12  B12 Lab should be drawn 3-4 days after last dose of vitamin B12 as it has a 6 day half life.  I recommend annual appt with optometry for vision check and dental exam and notify both providers that you have prediabetes.   Please let us  know if you have  further questions or concerns.     Sincerely,     Ellouise Hope NP-C

## 2024-09-08 NOTE — Telephone Encounter (Signed)
 See results note.

## 2024-09-15 ENCOUNTER — Ambulatory Visit: Admitting: General Practice

## 2024-09-15 VITALS — BP 144/82

## 2024-09-15 DIAGNOSIS — I10 Essential (primary) hypertension: Secondary | ICD-10-CM

## 2024-09-15 NOTE — Progress Notes (Signed)
 Grant Wilson presents to the clinic this afternoon for a B/P check.  B/P is lower than prior reading, he reports taking his antihypertensive medicine more frequently, possibly daily.  Will continue to follow for his progress.
